# Patient Record
Sex: Male | Born: 1960 | Race: White | Hispanic: No | Marital: Married | Smoking: Never smoker
Health system: Southern US, Community
[De-identification: ages and names within clinical notes are randomized; demographics above are authoritative.]

## PROBLEM LIST (undated history)

## (undated) DIAGNOSIS — E669 Obesity, unspecified: Secondary | ICD-10-CM

## (undated) DIAGNOSIS — I10 Essential (primary) hypertension: Secondary | ICD-10-CM

## (undated) DIAGNOSIS — I5189 Other ill-defined heart diseases: Secondary | ICD-10-CM

## (undated) DIAGNOSIS — Q120 Congenital cataract: Secondary | ICD-10-CM

## (undated) DIAGNOSIS — T7840XA Allergy, unspecified, initial encounter: Secondary | ICD-10-CM

## (undated) DIAGNOSIS — C4491 Basal cell carcinoma of skin, unspecified: Secondary | ICD-10-CM

## (undated) DIAGNOSIS — J309 Allergic rhinitis, unspecified: Secondary | ICD-10-CM

## (undated) DIAGNOSIS — K219 Gastro-esophageal reflux disease without esophagitis: Secondary | ICD-10-CM

## (undated) DIAGNOSIS — C4492 Squamous cell carcinoma of skin, unspecified: Secondary | ICD-10-CM

## (undated) DIAGNOSIS — B20 Human immunodeficiency virus [HIV] disease: Secondary | ICD-10-CM

## (undated) DIAGNOSIS — R42 Dizziness and giddiness: Secondary | ICD-10-CM

## (undated) DIAGNOSIS — I2510 Atherosclerotic heart disease of native coronary artery without angina pectoris: Secondary | ICD-10-CM

## (undated) DIAGNOSIS — E782 Mixed hyperlipidemia: Secondary | ICD-10-CM

## (undated) DIAGNOSIS — F419 Anxiety disorder, unspecified: Secondary | ICD-10-CM

## (undated) DIAGNOSIS — R262 Difficulty in walking, not elsewhere classified: Secondary | ICD-10-CM

## (undated) HISTORY — DX: Essential (primary) hypertension: I10

## (undated) HISTORY — DX: Human immunodeficiency virus (HIV) disease: B20

## (undated) HISTORY — DX: Obesity, unspecified: E66.9

## (undated) HISTORY — DX: Basal cell carcinoma of skin, unspecified: C44.91

## (undated) HISTORY — PX: OTHER SURGICAL HISTORY: SHX169

## (undated) HISTORY — DX: Allergy, unspecified, initial encounter: T78.40XA

---

## 2002-01-07 ENCOUNTER — Inpatient Hospital Stay (HOSPITAL_COMMUNITY): Admission: EM | Admit: 2002-01-07 | Discharge: 2002-01-09 | Payer: Self-pay | Admitting: *Deleted

## 2006-03-16 ENCOUNTER — Ambulatory Visit: Payer: Self-pay | Admitting: Family Medicine

## 2006-09-10 ENCOUNTER — Ambulatory Visit: Payer: Self-pay | Admitting: Family Medicine

## 2007-03-01 ENCOUNTER — Ambulatory Visit: Payer: Self-pay | Admitting: Family Medicine

## 2007-03-06 ENCOUNTER — Ambulatory Visit: Payer: Self-pay | Admitting: Family Medicine

## 2007-03-06 ENCOUNTER — Encounter: Admission: RE | Admit: 2007-03-06 | Discharge: 2007-03-06 | Payer: Self-pay | Admitting: Family Medicine

## 2007-03-06 IMAGING — CR DG SI JOINTS 3+V
3 series · 3 of 3 positions shown · non-contrast
Comparison: None

SACROILIAC JOINTS - 3 VIEW:

CLINICAL DATA: Left low back pain

[t sacroiliac joints (1 of 3)]
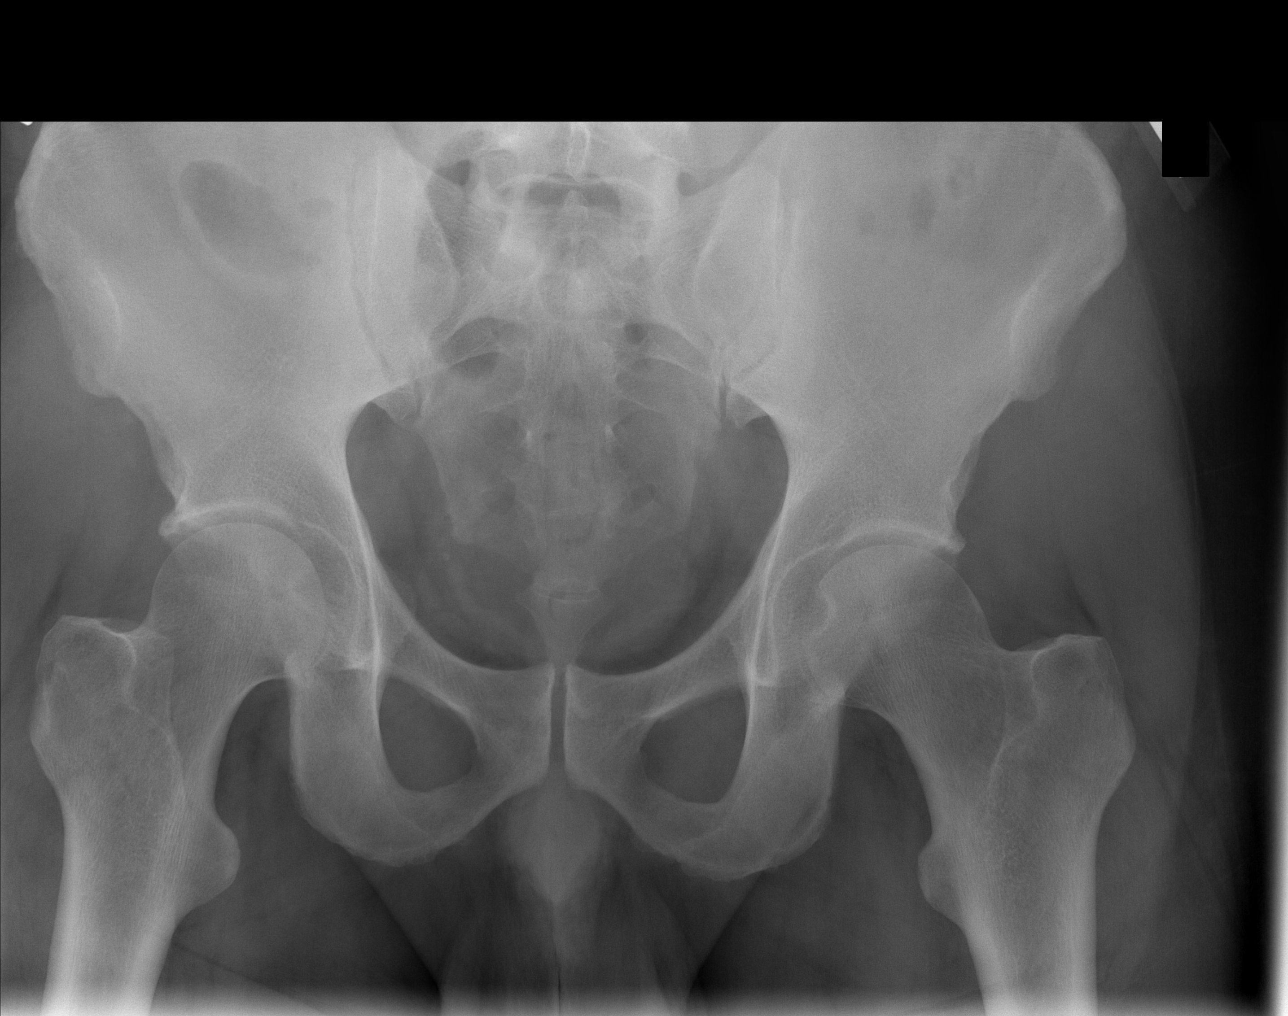

[t sacroiliac joints (2 of 3)]
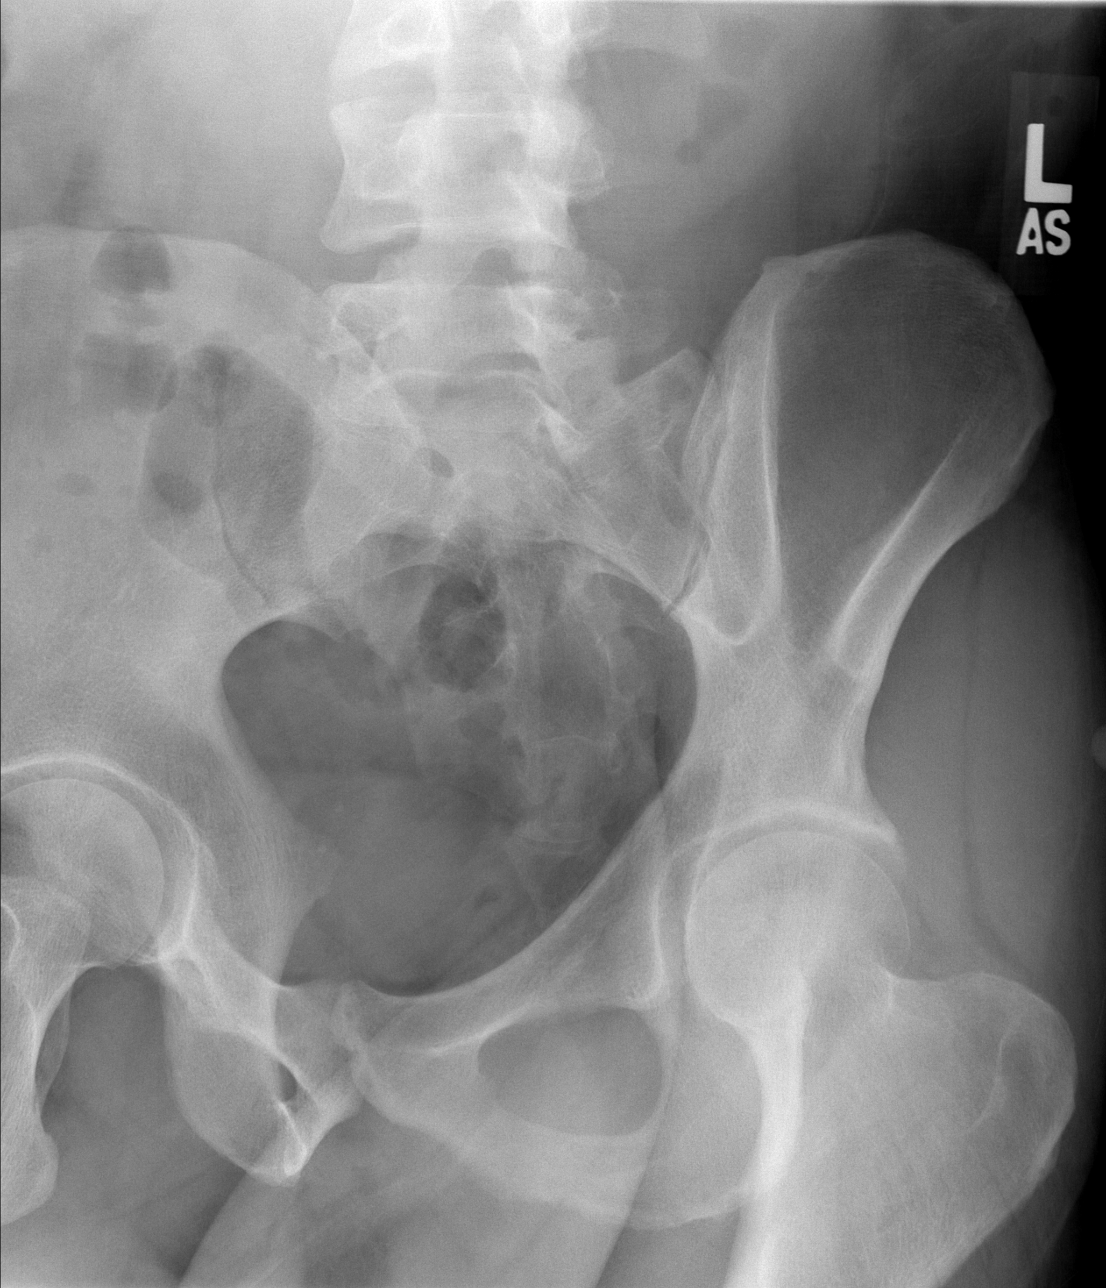

[t sacroiliac joints (3 of 3)]
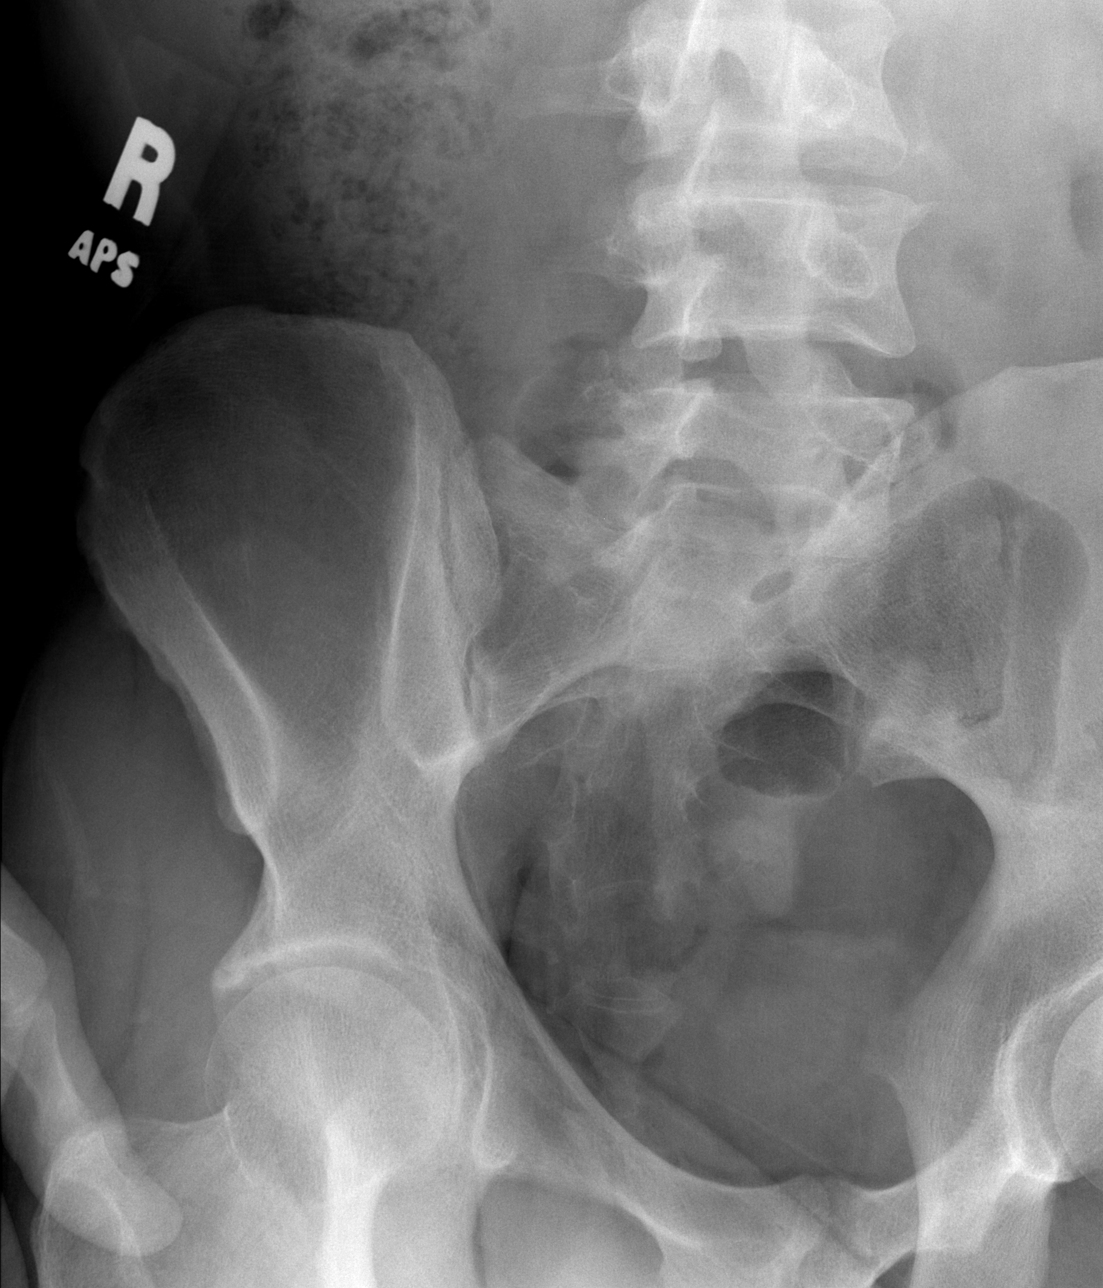

[3 of 3 positions shown; findings below may reference images not displayed]

FINDINGS: The SI joints are normal. No focal abnormality identified in the bony
anatomic pelvis. Joint space in the hips is symmetric.
IMPRESSION: Normal exam

## 2007-03-06 IMAGING — CR DG LUMBAR SPINE COMPLETE 4+V
5 series · 5 of 5 positions shown · non-contrast
Comparison: None.

LUMBAR SPINE - 4  VIEW:

CLINICAL DATA: Left-sided low back pain

[t l-spine a.p.]
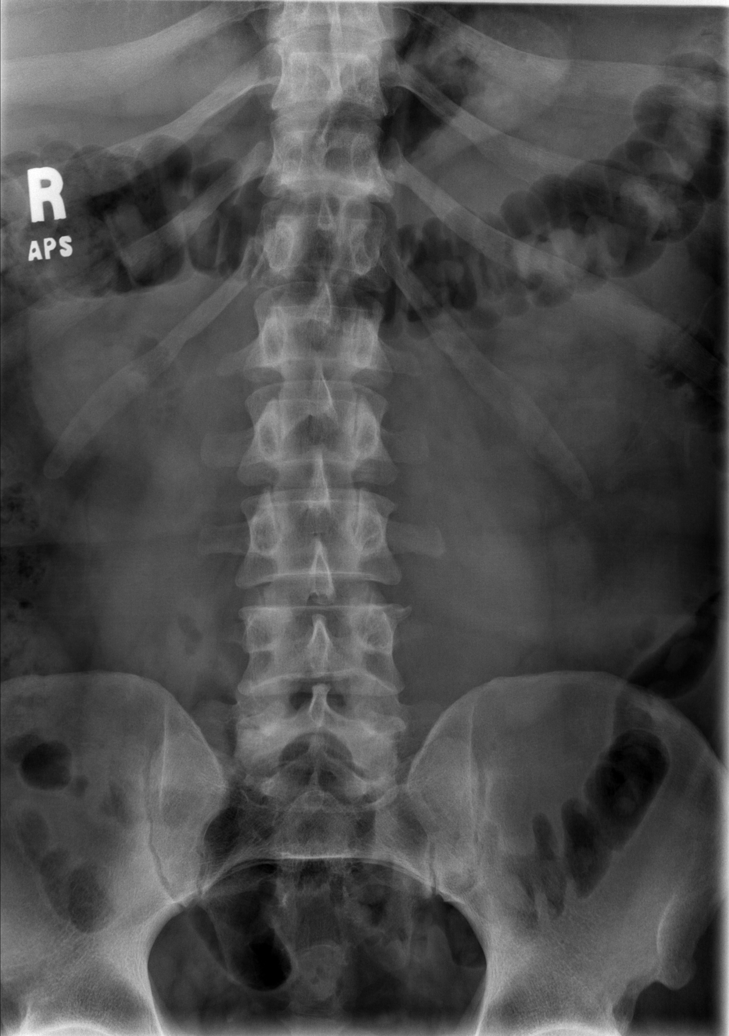

[t l-spine oblique exposure (1 of 2)]
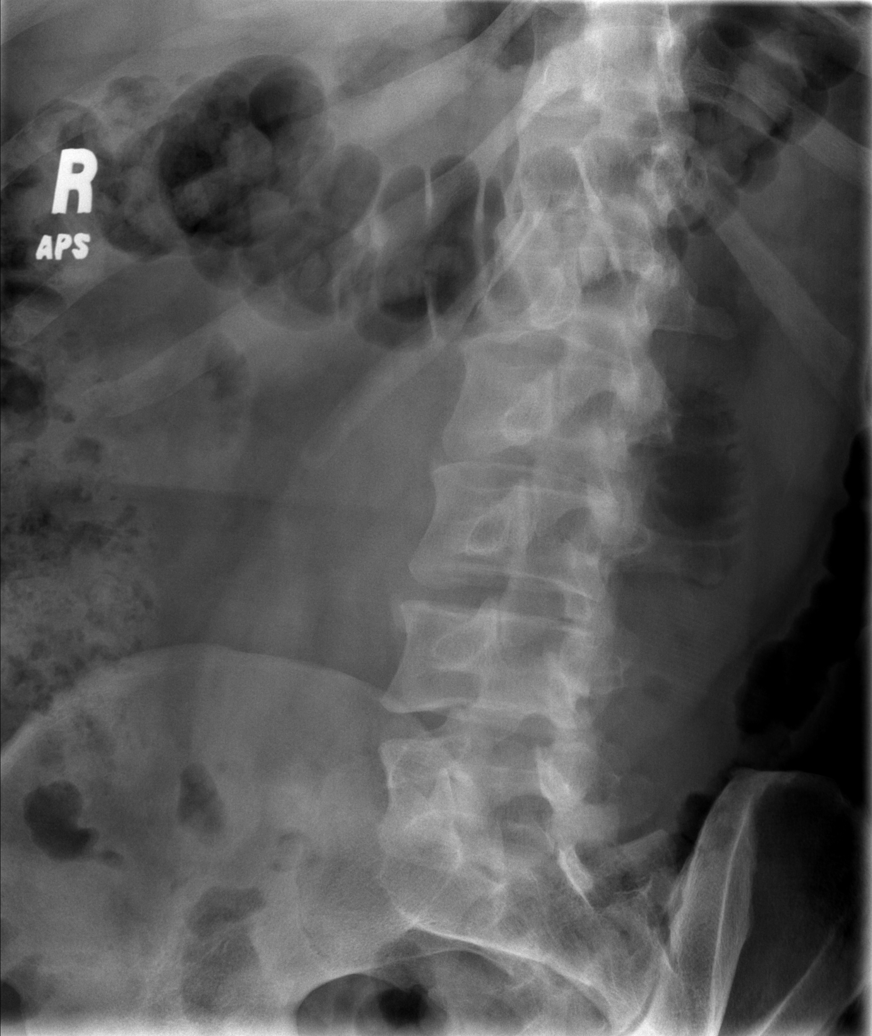

[t l-spine oblique exposure (2 of 2)]
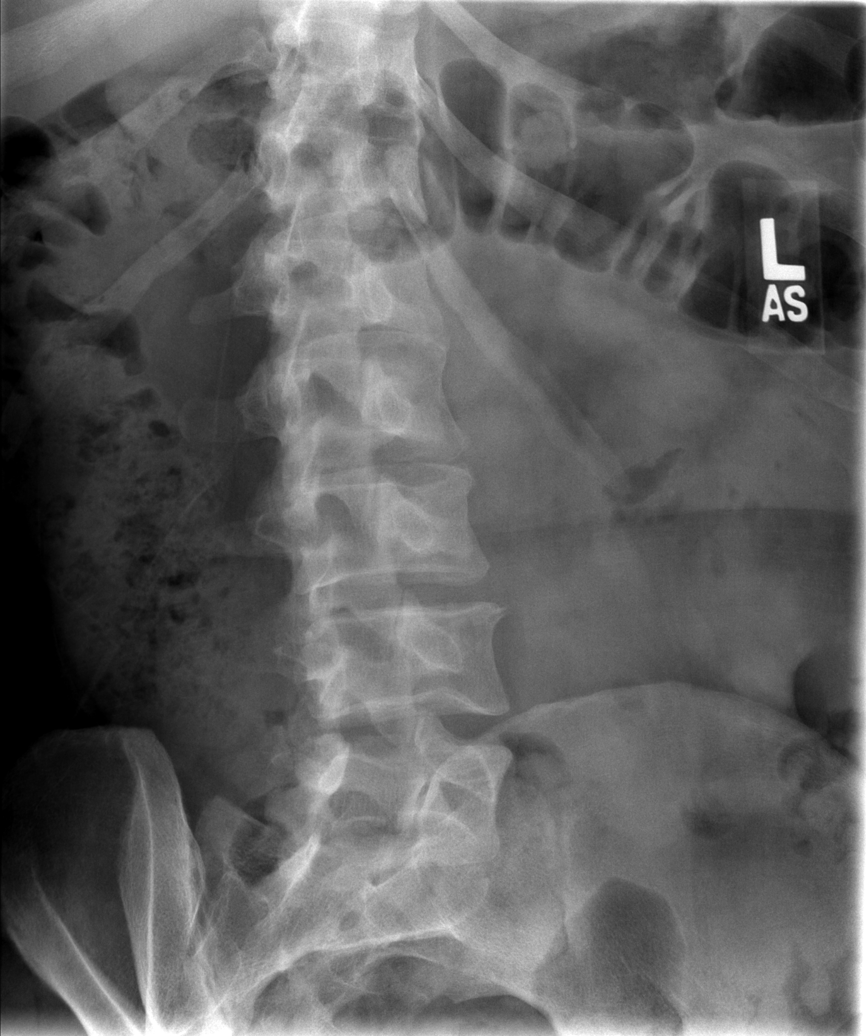

[t l-spine lat]
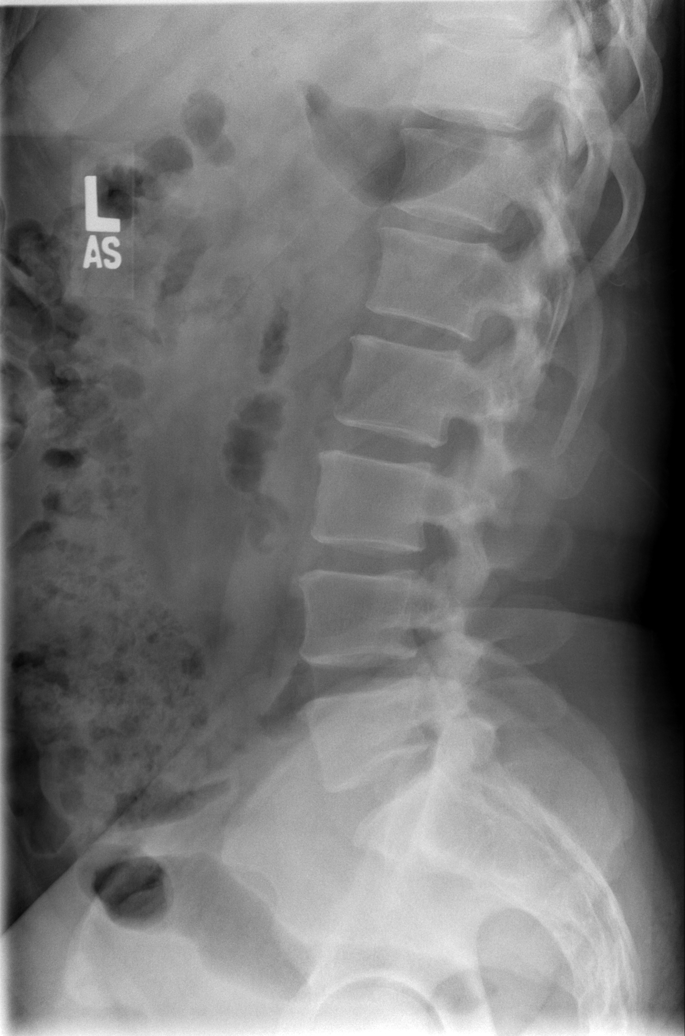

[t l-spine l5-s1 spot]
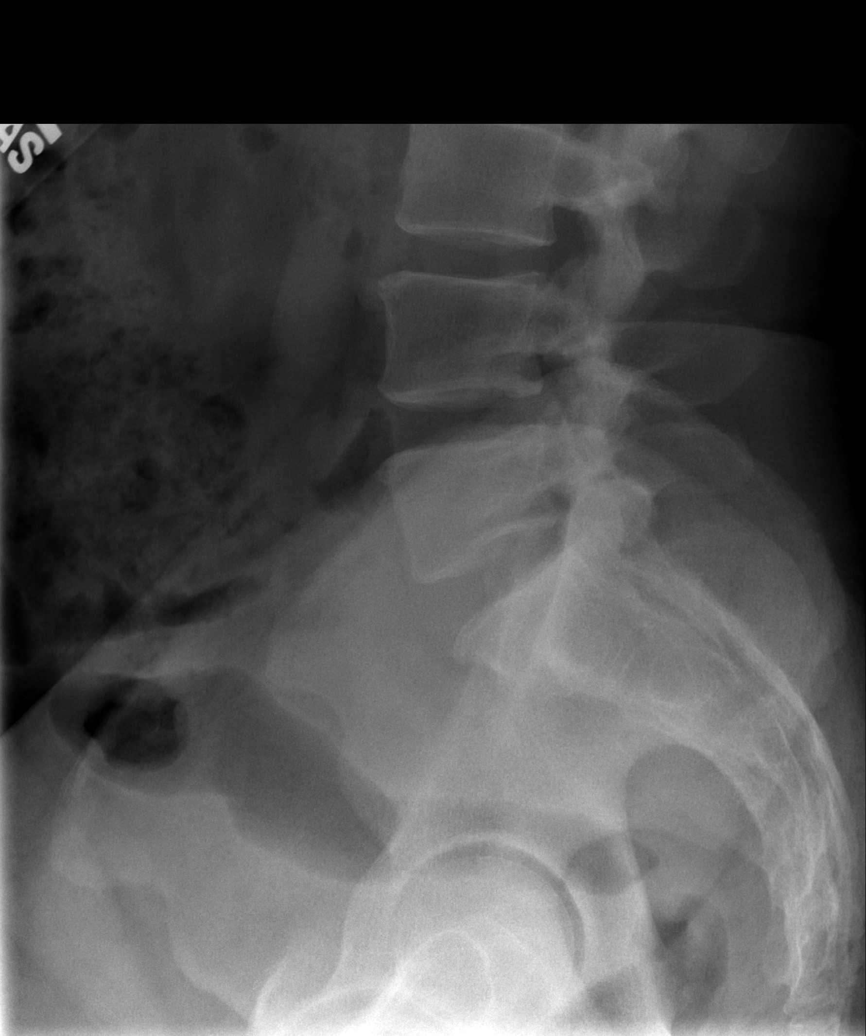

[5 of 5 positions shown; findings below may reference images not displayed]

FINDINGS: There are 5 nonrib-bearing lumbar type vertebral bodies.  Alignment
is normal and intervertebral disc spaces are preserved.  SI joints are
unremarkable.
IMPRESSION: No acute bony abnormality.

## 2007-11-13 ENCOUNTER — Ambulatory Visit: Payer: Self-pay | Admitting: Family Medicine

## 2007-12-12 ENCOUNTER — Ambulatory Visit: Payer: Self-pay | Admitting: Family Medicine

## 2008-02-20 ENCOUNTER — Ambulatory Visit: Payer: Self-pay | Admitting: Family Medicine

## 2008-03-06 ENCOUNTER — Ambulatory Visit: Payer: Self-pay | Admitting: Family Medicine

## 2008-06-09 ENCOUNTER — Ambulatory Visit: Payer: Self-pay | Admitting: Family Medicine

## 2008-12-23 ENCOUNTER — Ambulatory Visit: Payer: Self-pay | Admitting: Family Medicine

## 2009-06-24 ENCOUNTER — Ambulatory Visit: Payer: Self-pay | Admitting: Family Medicine

## 2009-09-28 ENCOUNTER — Ambulatory Visit: Payer: Self-pay | Admitting: Family Medicine

## 2010-06-08 ENCOUNTER — Ambulatory Visit: Payer: Self-pay | Admitting: Family Medicine

## 2010-06-14 ENCOUNTER — Ambulatory Visit: Payer: Self-pay | Admitting: Family Medicine

## 2010-12-23 NOTE — Op Note (Signed)
[facility]  Patient:    Christopher Hart, Christopher Hart Visit Number: [id number] MRN: [id number]          Service Type: MED Location: [id number] Attending Physician:  Talmadge Coventry Dictated by:   Darlin Priestly, M.D. Proc. Date: 01/08/02 Admit Date:  01/07/2002 Discharge Date: 01/09/2002   CC:         Ronnald Nian, M.D.   Operative Report  PROCEDURES: 1. Left heart catheterization. 2. Coronary angiography. 3. Left ventriculogram. 4. Abdominal aortogram.  COMPLICATIONS: None.  INDICATIONS: The patient is a 50 year old male, patient of Dr. Sharlot Gowda with a history of hypertension, HIV positive, who was admitted on January 08, 2002, with substernal chest pain and hypertension. The patient did give a history of several years of exertional angina with pain migrating to his left arm when walking around the block. He had several stress test in the past with marked blood pressure fluctuations, which prompted early cessation of his stress test. He is now brought to the cardiac catheterization laboratory to define his coronary anatomy.  DESCRIPTION OF PROCEDURE: After given informed written consent, the patient was brought to the cardiac catheterization lab where his right and left groins were shaved, prepped, and draped in the usual sterile fashion.  ECG monitoring was established.  Using modified Seldinger technique, a #6 French arterial sheath was inserted in the right femoral artery.  A 6 French diagnostic catheter was then used to perform diagnostic angiography.  This reveals a large left main with no significant disease.  The LAD is a large vessel which courses to the apex and gave rise to two diagonal branch.  The LAD has no significant disease.  The first diagonal is a large vessel which bifurcates in its distal segment and has no significant disease.  The left coronary artery also gave rise to a large ramus intermedius which bifurcates  in its distal segment and has mild 30% ostial narrowing.  The left circumflex is a large vessel which coursed in the AV groove and gave rise to one large obtuse marginal branch. The AV groove circumflex has no significant disease. The first OM is a large vessel which bifurcates in its mid segment and has no significant disease.  The right coronary artery is a large vessel which is dominant and gives rise to both the PDA as well as the posterolateral branch. The RCA is noted to have mild 30% proximal narrowing. The PDA is a large vessel with no significant disease. The PLA is a medium sized vessel with mild 30% proximal narrowing.  LEFT VENTRICULOGRAM: Left ventriculogram reveals a moderately depressed EF of 45-50% with mild global hypokinesis.  ABDOMINAL AORTOGRAM: Abdominal aortogram reveals no evidence of renal artery stenosis.  HEMODYNAMICS: Systemic arterial pressure 134/90, LV systemic pressure 137/12, LVEDP of 17.  CONCLUSIONS: 1. No significant coronary artery disease. 2. Mildly depressed left ventricular systolic function. 3. No evidence of renal artery stenosis. 4. Systemic hypertension. Dictated by:   Darlin Priestly, M.D. Attending Physician:  Talmadge Coventry DD:  01/08/02 TD:  01/10/02 Job: [id number]

## 2010-12-28 ENCOUNTER — Telehealth: Payer: Self-pay | Admitting: Family Medicine

## 2010-12-28 NOTE — Telephone Encounter (Signed)
Faxed refill out

## 2012-03-13 ENCOUNTER — Telehealth: Payer: Self-pay | Admitting: Family Medicine

## 2012-03-13 MED ORDER — EFAVIRENZ-EMTRICITAB-TENOFOVIR 600-200-300 MG PO TABS: 1.0000 | ORAL_TABLET | Freq: Every day | ORAL | Status: DC

## 2012-03-13 NOTE — Telephone Encounter (Signed)
[facility] called in 

## 2012-03-26 ENCOUNTER — Encounter: Payer: Self-pay | Admitting: Internal Medicine

## 2012-04-01 ENCOUNTER — Encounter: Payer: Self-pay | Admitting: Family Medicine

## 2012-04-01 ENCOUNTER — Ambulatory Visit (INDEPENDENT_AMBULATORY_CARE_PROVIDER_SITE_OTHER): Payer: 59 | Admitting: Family Medicine

## 2012-04-01 VITALS — BP 124/86 | HR 82 | Wt 244.0 lb

## 2012-04-01 DIAGNOSIS — B20 Human immunodeficiency virus [HIV] disease: Secondary | ICD-10-CM | POA: Insufficient documentation

## 2012-04-01 DIAGNOSIS — J309 Allergic rhinitis, unspecified: Secondary | ICD-10-CM

## 2012-04-01 DIAGNOSIS — Z21 Asymptomatic human immunodeficiency virus [HIV] infection status: Secondary | ICD-10-CM

## 2012-04-01 DIAGNOSIS — Z79899 Other long term (current) drug therapy: Secondary | ICD-10-CM

## 2012-04-01 DIAGNOSIS — E669 Obesity, unspecified: Secondary | ICD-10-CM

## 2012-04-01 DIAGNOSIS — I10 Essential (primary) hypertension: Secondary | ICD-10-CM

## 2012-04-01 LAB — CBC WITH DIFFERENTIAL/PLATELET
Basophils Absolute: 0.1 10*3/uL (ref 0.0–0.1)
Basophils Relative: 1 % (ref 0–1)
MCHC: 35 g/dL (ref 30.0–36.0)
Monocytes Absolute: 0.7 10*3/uL (ref 0.1–1.0)
Neutro Abs: 3.3 10*3/uL (ref 1.7–7.7)
Neutrophils Relative %: 42 % — ABNORMAL LOW (ref 43–77)
Platelets: 256 10*3/uL (ref 150–400)
RDW: 14.3 % (ref 11.5–15.5)

## 2012-04-01 LAB — COMPREHENSIVE METABOLIC PANEL
Alkaline Phosphatase: 122 U/L — ABNORMAL HIGH (ref 39–117)
BUN: 13 mg/dL (ref 6–23)
Glucose, Bld: 97 mg/dL (ref 70–99)
Total Bilirubin: 0.3 mg/dL (ref 0.3–1.2)

## 2012-04-01 LAB — LIPID PANEL
HDL: 35 mg/dL — ABNORMAL LOW (ref >39)
LDL Cholesterol: 79 mg/dL (ref 0–99)
Total CHOL/HDL Ratio: 4.3 Ratio
Triglycerides: 185 mg/dL — ABNORMAL HIGH (ref <150)
VLDL: 37 mg/dL (ref 0–40)

## 2012-04-01 MED ORDER — HYDROCHLOROTHIAZIDE 25 MG PO TABS: 25.0000 mg | ORAL_TABLET | Freq: Every day | ORAL | Status: DC

## 2012-04-01 MED ORDER — EFAVIRENZ-EMTRICITAB-TENOFOVIR 600-200-300 MG PO TABS: 1.0000 | ORAL_TABLET | Freq: Every day | ORAL | Status: AC

## 2012-04-01 MED ORDER — RAMIPRIL 10 MG PO CAPS: 10.0000 mg | ORAL_CAPSULE | Freq: Every day | ORAL | Status: DC

## 2012-04-01 NOTE — Patient Instructions (Signed)
Give Christopher Hart a call [phone]

## 2012-04-01 NOTE — Progress Notes (Signed)
  Subjective:    Patient ID: Christopher SCIASCIA, male    DOB: 1961-03-05, 51 y.o.   MRN: [id number]  HPI He is here for recheck. He is now working again in a call center. He continues on medications listed in the chart. His social history is unchanged. He is taking care of his parents which is starting to take a toll on him. He does plan to get back into counseling to help deal better psychologically with his parents failing health .He has no fever, chills, cough, congestion, weight change, headache. His allergies seem to be under good control.   Review of Systems Negative except as above    Objective:   Physical Exam alert and in no distress. Funduscopic exam normal Tympanic membranes and canals are normal. Throat is clear. Tonsils are normal. Neck is supple without adenopathy or thyromegaly. Cardiac exam shows a regular sinus rhythm without murmurs or gallops. Lungs are clear to auscultation. Abdominal exam shows no hepatosplenomegaly, masses or tenderness        Assessment & Plan:   1. Hypertension  ramipril (ALTACE) 10 MG capsule, hydrochlorothiazide (HYDRODIURIL) 25 MG tablet  2. HIV positive, asymptomatic  efavirenz-emtricitabine-tenofovir (ATRIPLA) 600-200-300 MG per tablet, HIV 1 RNA quant-no reflex-bld, T-helper cells (CD4) count, Comprehensive metabolic panel, CBC with Differential  3. Allergic rhinitis, mild    4. Obesity (BMI 35.0-39.9 without comorbidity)  Comprehensive metabolic panel, Lipid panel  5. Encounter for long-term (current) use of other medications  HIV 1 RNA quant-no reflex-bld, T-helper cells (CD4) count, Comprehensive metabolic panel, Lipid panel, CBC with Differential

## 2012-04-02 LAB — T-HELPER CELLS (CD4) COUNT (NOT AT ARMC)
CD4 T Helper %: 49 % (ref 32–62)
Total Lymphocyte: 42 % (ref 12–46)
Total lymphocyte count: 3318 /uL — ABNORMAL HIGH (ref 700–3300)

## 2012-04-03 LAB — HIV-1 RNA QUANT-NO REFLEX-BLD
HIV 1 RNA Quant: <20 copies/mL (ref <20)
HIV-1 RNA Quant, Log: <1.3 {Log} (ref <1.30)

## 2012-10-02 ENCOUNTER — Ambulatory Visit: Payer: 59 | Admitting: Family Medicine

## 2012-10-03 ENCOUNTER — Ambulatory Visit: Payer: 59 | Admitting: Family Medicine

## 2012-10-24 ENCOUNTER — Ambulatory Visit: Payer: 59 | Admitting: Family Medicine

## 2013-05-13 ENCOUNTER — Other Ambulatory Visit: Payer: Self-pay | Admitting: Family Medicine

## 2013-10-02 ENCOUNTER — Telehealth: Payer: Self-pay | Admitting: Internal Medicine

## 2013-10-02 NOTE — Telephone Encounter (Signed)
Faxed over medical records to [facility] to [facility] @ [phone]

## 2014-02-03 ENCOUNTER — Other Ambulatory Visit: Payer: Self-pay | Admitting: Family Medicine

## 2014-02-03 ENCOUNTER — Telehealth: Payer: Self-pay

## 2014-02-03 NOTE — Telephone Encounter (Signed)
Dr.lalonde the patient has not been here since 2013 is this okay ?

## 2014-02-03 NOTE — Telephone Encounter (Signed)
He needs an appointment

## 2014-02-03 NOTE — Telephone Encounter (Signed)
Called and left pt message on his cell # that he has not been seen her since 8/13 that he would need an appointment per DR.Lalonde before refills

## 2016-08-07 DIAGNOSIS — D432 Neoplasm of uncertain behavior of brain, unspecified: Secondary | ICD-10-CM

## 2016-08-07 HISTORY — DX: Neoplasm of uncertain behavior of brain, unspecified: D43.2

## 2017-06-12 ENCOUNTER — Other Ambulatory Visit: Payer: Self-pay

## 2017-06-12 ENCOUNTER — Encounter: Payer: Self-pay | Admitting: Emergency Medicine

## 2017-06-12 ENCOUNTER — Emergency Department: Payer: 59

## 2017-06-12 ENCOUNTER — Inpatient Hospital Stay
Admission: EM | Admit: 2017-06-12 | Discharge: 2017-06-13 | DRG: 282 | Disposition: A | Discharge status: Home / Self Care | Payer: 59 | Attending: Internal Medicine | Admitting: Internal Medicine

## 2017-06-12 ENCOUNTER — Inpatient Hospital Stay (HOSPITAL_COMMUNITY)
Admit: 2017-06-12 | Discharge: 2017-06-12 | Disposition: A | Discharge status: Home / Self Care | Payer: 59 | Attending: Physician Assistant | Admitting: Physician Assistant

## 2017-06-12 DIAGNOSIS — Z21 Asymptomatic human immunodeficiency virus [HIV] infection status: Secondary | ICD-10-CM | POA: Diagnosis present

## 2017-06-12 DIAGNOSIS — I10 Essential (primary) hypertension: Secondary | ICD-10-CM | POA: Diagnosis present

## 2017-06-12 DIAGNOSIS — I214 Non-ST elevation (NSTEMI) myocardial infarction: Principal | ICD-10-CM | POA: Diagnosis present

## 2017-06-12 DIAGNOSIS — I340 Nonrheumatic mitral (valve) insufficiency: Secondary | ICD-10-CM | POA: Diagnosis not present

## 2017-06-12 DIAGNOSIS — Z91041 Radiographic dye allergy status: Secondary | ICD-10-CM

## 2017-06-12 DIAGNOSIS — E785 Hyperlipidemia, unspecified: Secondary | ICD-10-CM | POA: Diagnosis present

## 2017-06-12 DIAGNOSIS — I2510 Atherosclerotic heart disease of native coronary artery without angina pectoris: Secondary | ICD-10-CM | POA: Diagnosis present

## 2017-06-12 DIAGNOSIS — Z79899 Other long term (current) drug therapy: Secondary | ICD-10-CM

## 2017-06-12 DIAGNOSIS — Z85828 Personal history of other malignant neoplasm of skin: Secondary | ICD-10-CM

## 2017-06-12 DIAGNOSIS — Z6834 Body mass index (BMI) 34.0-34.9, adult: Secondary | ICD-10-CM | POA: Diagnosis not present

## 2017-06-12 DIAGNOSIS — F419 Anxiety disorder, unspecified: Secondary | ICD-10-CM | POA: Diagnosis present

## 2017-06-12 DIAGNOSIS — Z880 Allergy status to penicillin: Secondary | ICD-10-CM | POA: Diagnosis not present

## 2017-06-12 DIAGNOSIS — Z7982 Long term (current) use of aspirin: Secondary | ICD-10-CM

## 2017-06-12 LAB — TROPONIN I
TROPONIN I: 0.29 ng/mL — AB (ref <0.03)
TROPONIN I: 3.5 ng/mL — AB (ref <0.03)
Troponin I: 1.01 ng/mL (ref <0.03)

## 2017-06-12 LAB — LIPID PANEL
CHOL/HDL RATIO: 4.7 ratio
CHOLESTEROL: 177 mg/dL (ref 0–200)
HDL: 38 mg/dL — ABNORMAL LOW (ref >40)
LDL CALC: 119 mg/dL — AB (ref 0–99)
TRIGLYCERIDES: 101 mg/dL (ref <150)
VLDL: 20 mg/dL (ref 0–40)

## 2017-06-12 LAB — BASIC METABOLIC PANEL
Anion gap: 8 (ref 5–15)
BUN: 13 mg/dL (ref 6–20)
CALCIUM: 8.9 mg/dL (ref 8.9–10.3)
CHLORIDE: 102 mmol/L (ref 101–111)
CO2: 26 mmol/L (ref 22–32)
CREATININE: 1.14 mg/dL (ref 0.61–1.24)
GFR calc non Af Amer: >60 mL/min (ref >60)
GLUCOSE: 118 mg/dL — AB (ref 65–99)
Potassium: 3.9 mmol/L (ref 3.5–5.1)
Sodium: 136 mmol/L (ref 135–145)

## 2017-06-12 LAB — HEMOGLOBIN A1C
Hgb A1c MFr Bld: 5.5 % (ref 4.8–5.6)
Mean Plasma Glucose: 111.15 mg/dL

## 2017-06-12 LAB — CBC
HCT: 49.5 % (ref 40.0–52.0)
Hemoglobin: 16.7 g/dL (ref 13.0–18.0)
MCH: 28.9 pg (ref 26.0–34.0)
MCHC: 33.6 g/dL (ref 32.0–36.0)
MCV: 86 fL (ref 80.0–100.0)
PLATELETS: 250 10*3/uL (ref 150–440)
RBC: 5.76 MIL/uL (ref 4.40–5.90)
RDW: 13.8 % (ref 11.5–14.5)
WBC: 10.7 10*3/uL — ABNORMAL HIGH (ref 3.8–10.6)

## 2017-06-12 LAB — HEPARIN LEVEL (UNFRACTIONATED): Heparin Unfractionated: 0.17 IU/mL — ABNORMAL LOW (ref 0.30–0.70)

## 2017-06-12 LAB — APTT: aPTT: 32 seconds (ref 24–36)

## 2017-06-12 LAB — PROTIME-INR
INR: 1.01
Prothrombin Time: 13.2 seconds (ref 11.4–15.2)

## 2017-06-12 IMAGING — CR DG CHEST 2V
1 series · 2 of 2 positions shown · non-contrast
Comparison: None.

CLINICAL DATA: Chest pain.

EXAM:
CHEST  2 VIEW

[Series 1: w chest pa · 0.14mm/px · 2 of 2 slices shown]
[im 1/2]
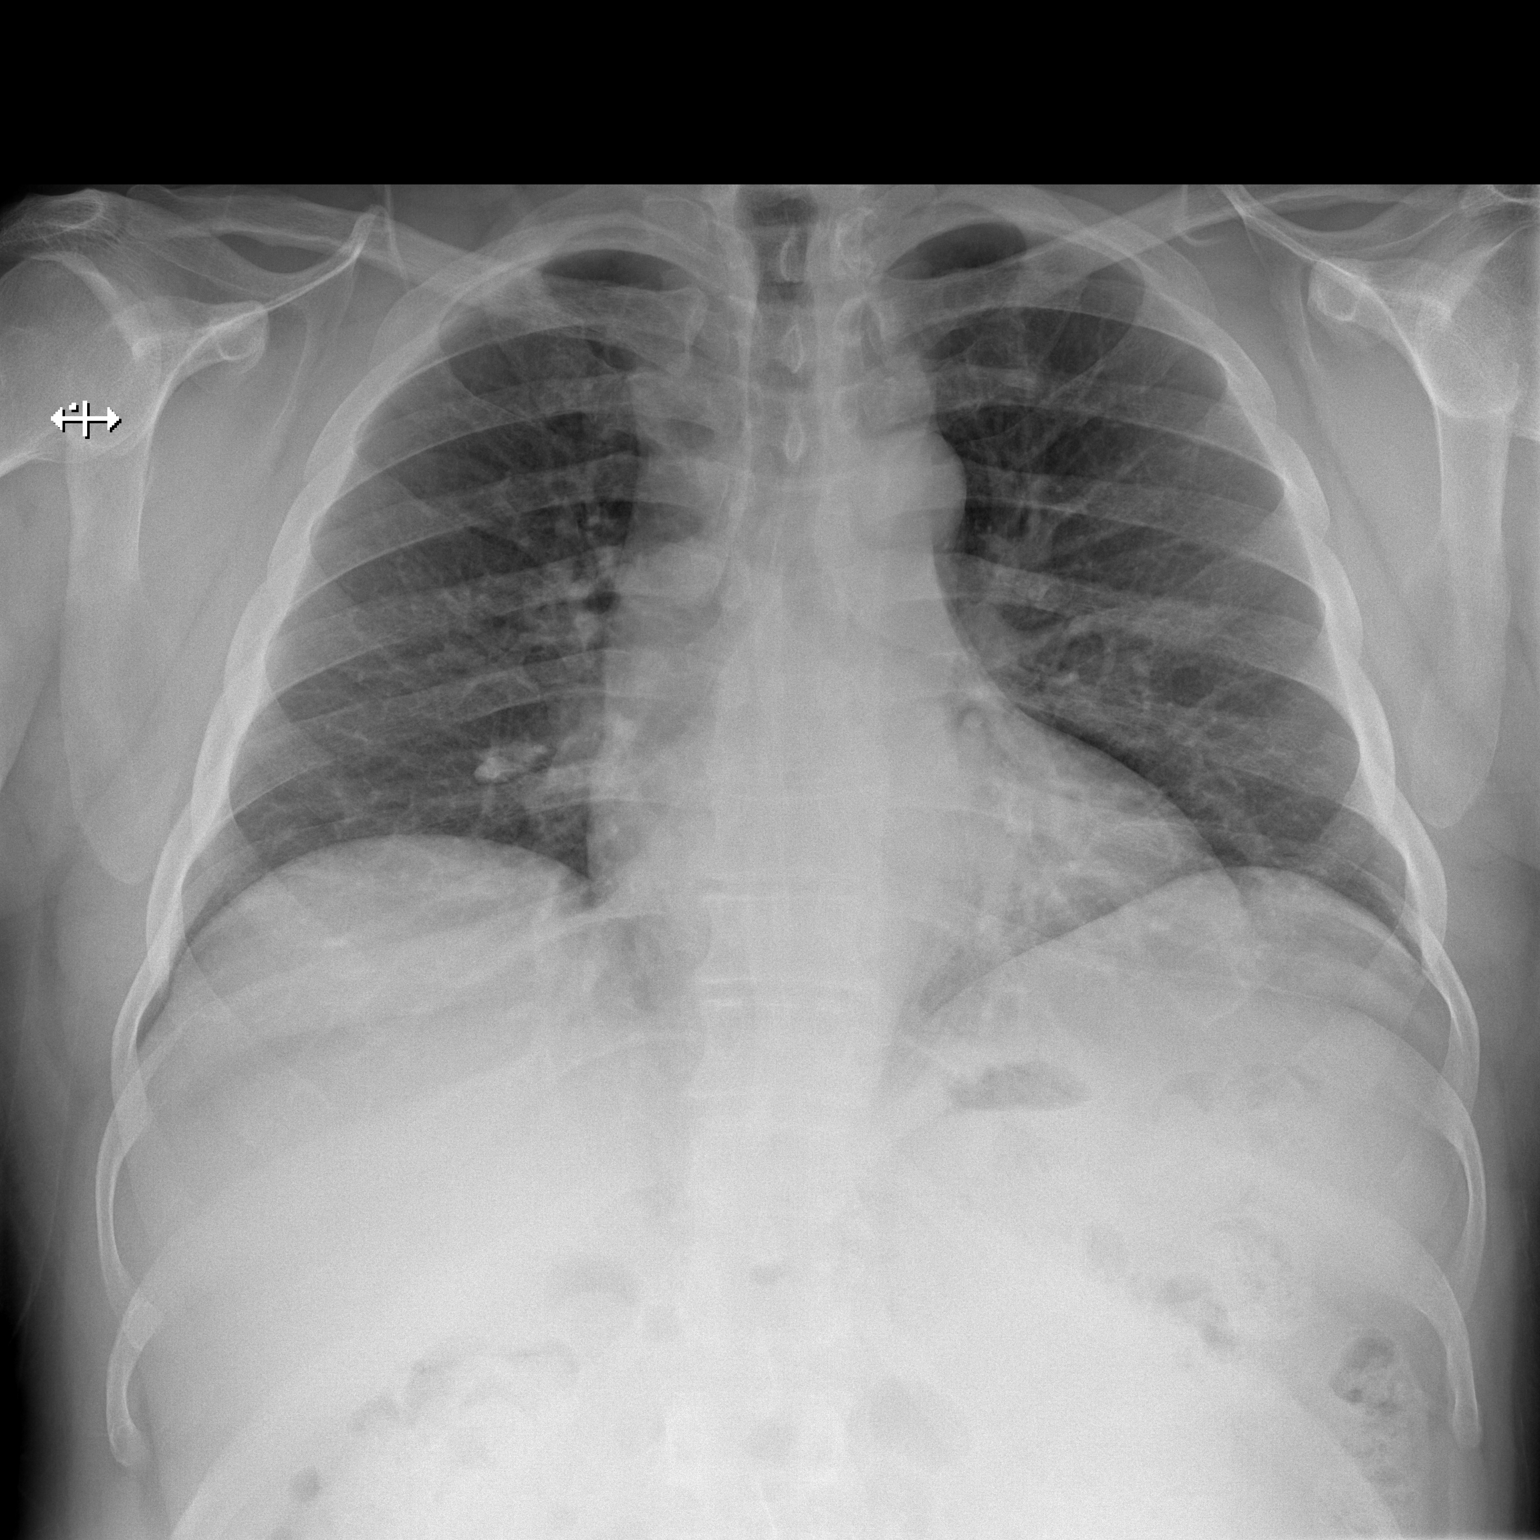
[im 2/2]
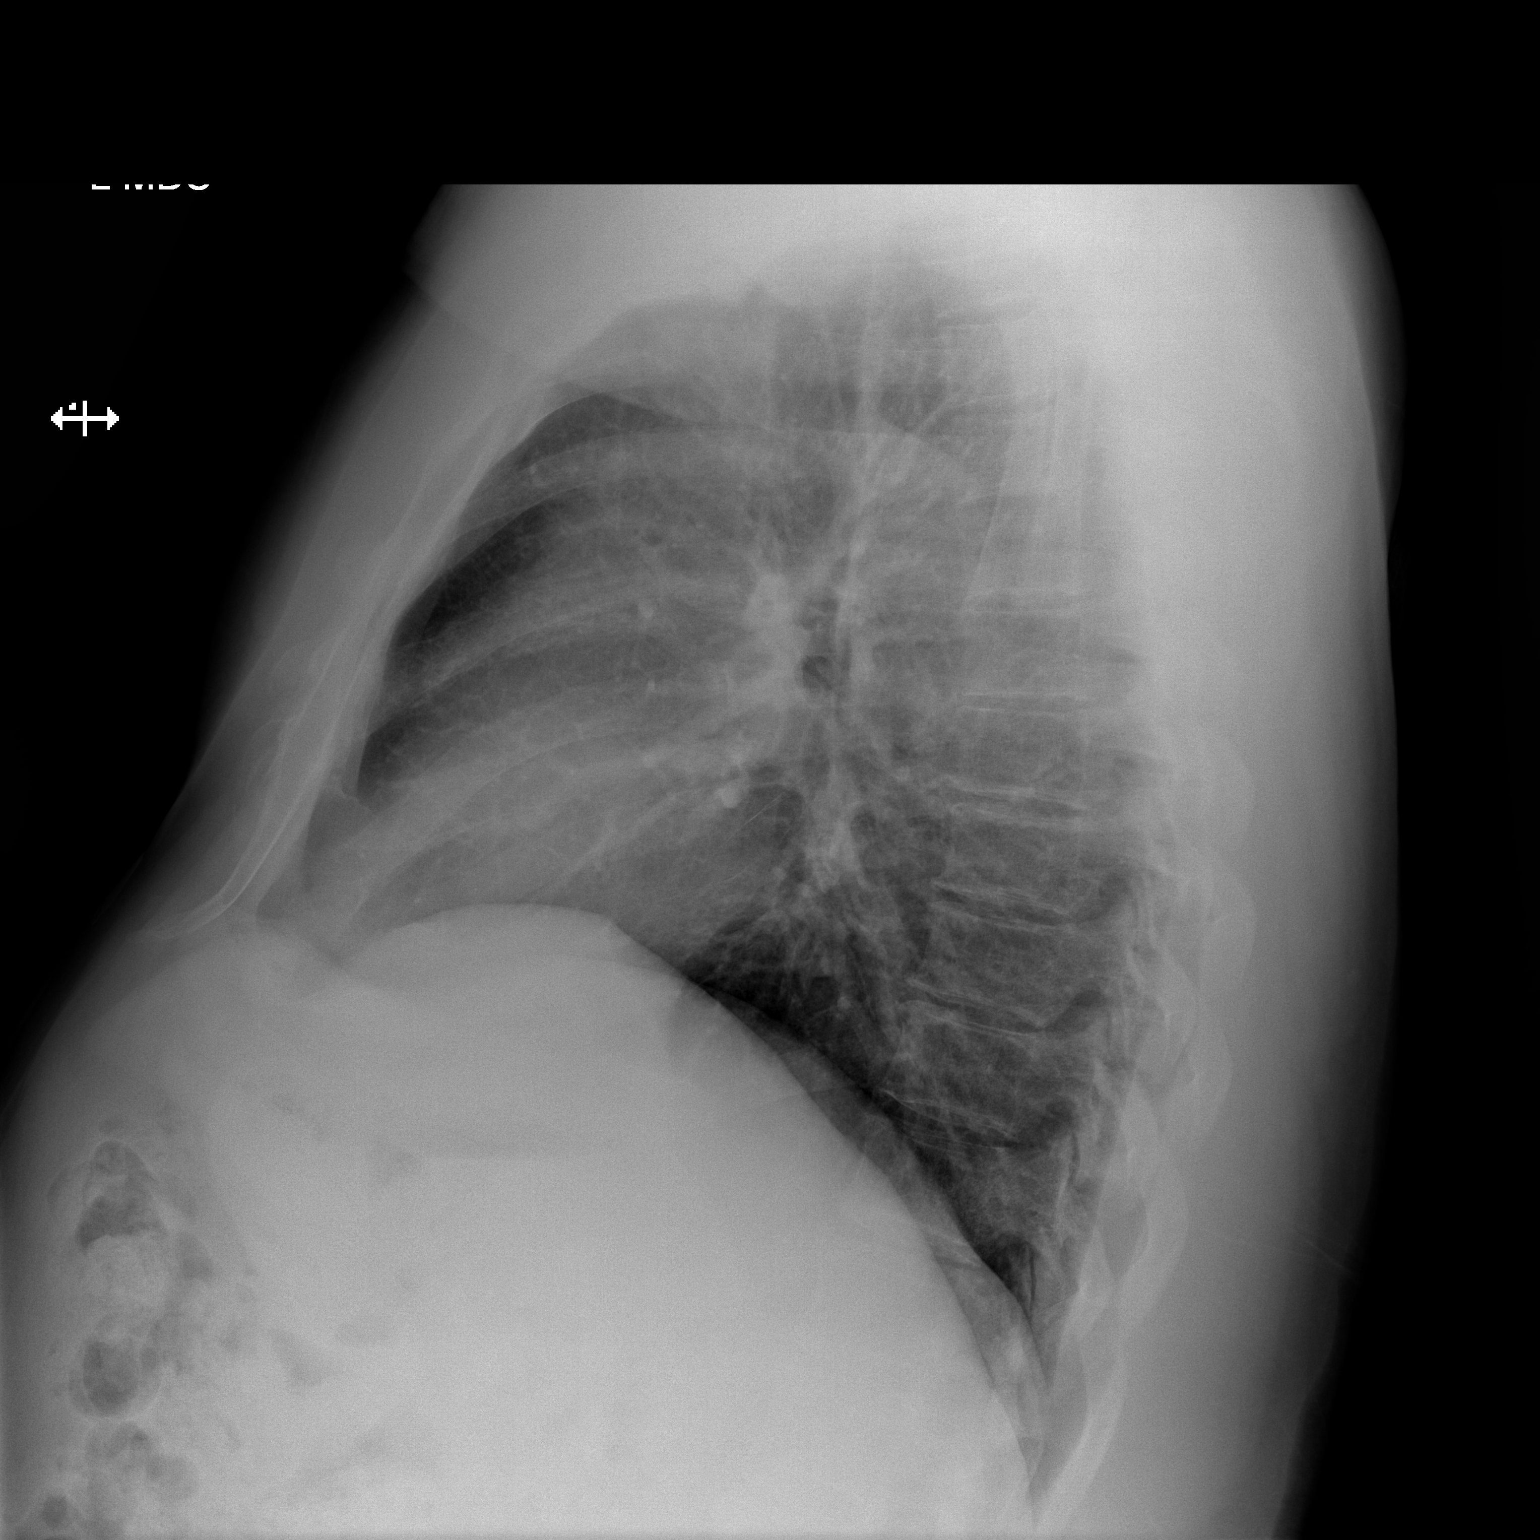

[2 of 2 positions shown; findings below may reference images not displayed]

FINDINGS: The heart size and mediastinal contours are within normal limits.
Both lungs are clear. No pneumothorax or pleural effusion is noted.
The visualized skeletal structures are unremarkable.
IMPRESSION: No active cardiopulmonary disease.

## 2017-06-12 MED ORDER — ASPIRIN 81 MG PO CHEW
81.0000 mg | CHEWABLE_TABLET | Freq: Every day | ORAL | Status: DC
  Administered 2017-06-13: 81 mg via ORAL
  Filled 2017-06-12: qty 1

## 2017-06-12 MED ORDER — DIPHENHYDRAMINE HCL 50 MG/ML IJ SOLN
50.0000 mg | Freq: Once | INTRAMUSCULAR | Status: AC
  Administered 2017-06-13: 50 mg via INTRAVENOUS
  Filled 2017-06-12 (×2): qty 1

## 2017-06-12 MED ORDER — PREDNISONE 50 MG PO TABS
50.0000 mg | ORAL_TABLET | Freq: Four times a day (QID) | ORAL | Status: AC
  Administered 2017-06-12 – 2017-06-13 (×3): 50 mg via ORAL
  Filled 2017-06-12 (×3): qty 1

## 2017-06-12 MED ORDER — MORPHINE SULFATE (PF) 2 MG/ML IV SOLN: 2.0000 mg | INTRAVENOUS | Status: DC | PRN

## 2017-06-12 MED ORDER — DIPHENHYDRAMINE HCL 50 MG/ML IJ SOLN: 50.0000 mg | Freq: Once | INTRAMUSCULAR | Status: DC

## 2017-06-12 MED ORDER — ACETAMINOPHEN 500 MG PO TABS
ORAL_TABLET | ORAL | Status: AC
  Filled 2017-06-12: qty 2

## 2017-06-12 MED ORDER — LISINOPRIL 10 MG PO TABS
10.0000 mg | ORAL_TABLET | Freq: Every day | ORAL | Status: DC
  Administered 2017-06-13: 10 mg via ORAL
  Filled 2017-06-12: qty 1

## 2017-06-12 MED ORDER — HEPARIN BOLUS VIA INFUSION
4000.0000 [IU] | Freq: Once | INTRAVENOUS | Status: AC
  Administered 2017-06-12: 4000 [IU] via INTRAVENOUS
  Filled 2017-06-12: qty 4000

## 2017-06-12 MED ORDER — HYDROCODONE-ACETAMINOPHEN 5-325 MG PO TABS: 1.0000 | ORAL_TABLET | ORAL | Status: DC | PRN

## 2017-06-12 MED ORDER — ASPIRIN 81 MG PO CHEW
81.0000 mg | CHEWABLE_TABLET | ORAL | Status: AC
  Administered 2017-06-13: 81 mg via ORAL
  Filled 2017-06-12: qty 1

## 2017-06-12 MED ORDER — SODIUM CHLORIDE 0.9 % WEIGHT BASED INFUSION
1.0000 mL/kg/h | INTRAVENOUS | Status: DC
  Administered 2017-06-13: 1 mL/kg/h via INTRAVENOUS

## 2017-06-12 MED ORDER — ONDANSETRON HCL 4 MG PO TABS: 4.0000 mg | ORAL_TABLET | Freq: Four times a day (QID) | ORAL | Status: DC | PRN

## 2017-06-12 MED ORDER — HYDRALAZINE HCL 20 MG/ML IJ SOLN: 10.0000 mg | Freq: Four times a day (QID) | INTRAMUSCULAR | Status: DC | PRN

## 2017-06-12 MED ORDER — LABETALOL HCL 5 MG/ML IV SOLN
5.0000 mg | Freq: Once | INTRAVENOUS | Status: DC
  Filled 2017-06-12: qty 4

## 2017-06-12 MED ORDER — SODIUM CHLORIDE 0.9 % IV SOLN: 250.0000 mL | INTRAVENOUS | Status: DC | PRN

## 2017-06-12 MED ORDER — ACETAMINOPHEN 325 MG PO TABS: 650.0000 mg | ORAL_TABLET | Freq: Four times a day (QID) | ORAL | Status: DC | PRN

## 2017-06-12 MED ORDER — BISACODYL 10 MG RE SUPP: 10.0000 mg | Freq: Every day | RECTAL | Status: DC | PRN

## 2017-06-12 MED ORDER — SODIUM CHLORIDE 0.9 % WEIGHT BASED INFUSION: 3.0000 mL/kg/h | INTRAVENOUS | Status: AC

## 2017-06-12 MED ORDER — HEPARIN (PORCINE) IN NACL 100-0.45 UNIT/ML-% IJ SOLN
1400.0000 [IU]/h | INTRAMUSCULAR | Status: DC
  Administered 2017-06-12: 1200 [IU]/h via INTRAVENOUS
  Administered 2017-06-13: 1400 [IU]/h via INTRAVENOUS
  Filled 2017-06-12 (×3): qty 250

## 2017-06-12 MED ORDER — SENNOSIDES-DOCUSATE SODIUM 8.6-50 MG PO TABS: 1.0000 | ORAL_TABLET | Freq: Every evening | ORAL | Status: DC | PRN

## 2017-06-12 MED ORDER — METOPROLOL TARTRATE 25 MG PO TABS
25.0000 mg | ORAL_TABLET | Freq: Two times a day (BID) | ORAL | Status: DC
  Administered 2017-06-12 – 2017-06-13 (×2): 25 mg via ORAL
  Filled 2017-06-12 (×2): qty 1

## 2017-06-12 MED ORDER — SODIUM CHLORIDE 0.9% FLUSH
3.0000 mL | INTRAVENOUS | Status: DC | PRN
Start: 2017-06-12 — End: 2017-06-13

## 2017-06-12 MED ORDER — NITROGLYCERIN 0.4 MG SL SUBL
0.4000 mg | SUBLINGUAL_TABLET | SUBLINGUAL | Status: DC | PRN
  Administered 2017-06-12 (×2): 0.4 mg via SUBLINGUAL
  Filled 2017-06-12: qty 1

## 2017-06-12 MED ORDER — ASPIRIN 81 MG PO CHEW
162.0000 mg | CHEWABLE_TABLET | Freq: Once | ORAL | Status: AC
  Administered 2017-06-12: 162 mg via ORAL
  Filled 2017-06-12: qty 2

## 2017-06-12 MED ORDER — DIPHENHYDRAMINE HCL 25 MG PO CAPS: 50.0000 mg | ORAL_CAPSULE | Freq: Once | ORAL | Status: DC

## 2017-06-12 MED ORDER — ROSUVASTATIN CALCIUM 10 MG PO TABS
10.0000 mg | ORAL_TABLET | Freq: Every day | ORAL | Status: DC
  Administered 2017-06-12 – 2017-06-13 (×2): 10 mg via ORAL
  Filled 2017-06-12: qty 1

## 2017-06-12 MED ORDER — DIPHENHYDRAMINE HCL 25 MG PO CAPS
50.0000 mg | ORAL_CAPSULE | Freq: Once | ORAL | Status: AC
  Filled 2017-06-12: qty 2

## 2017-06-12 MED ORDER — HEPARIN BOLUS VIA INFUSION
3300.0000 [IU] | Freq: Once | INTRAVENOUS | Status: AC
  Administered 2017-06-12: 3300 [IU] via INTRAVENOUS
  Filled 2017-06-12: qty 3300

## 2017-06-12 MED ORDER — SODIUM CHLORIDE 0.9% FLUSH
3.0000 mL | Freq: Two times a day (BID) | INTRAVENOUS | Status: DC
  Administered 2017-06-12 – 2017-06-13 (×2): 3 mL via INTRAVENOUS

## 2017-06-12 MED ORDER — ACETAMINOPHEN 650 MG RE SUPP: 650.0000 mg | Freq: Four times a day (QID) | RECTAL | Status: DC | PRN

## 2017-06-12 MED ORDER — ONDANSETRON HCL 4 MG/2ML IJ SOLN: 4.0000 mg | Freq: Four times a day (QID) | INTRAMUSCULAR | Status: DC | PRN

## 2017-06-12 MED ORDER — ACETAMINOPHEN 500 MG PO TABS
1000.0000 mg | ORAL_TABLET | Freq: Once | ORAL | Status: AC
  Administered 2017-06-12: 1000 mg via ORAL

## 2017-06-12 MED ORDER — NITROGLYCERIN 0.4 MG SL SUBL: 0.4000 mg | SUBLINGUAL_TABLET | SUBLINGUAL | Status: DC | PRN

## 2017-06-12 NOTE — Consult Note (Signed)
Cardiology Consultation:   Patient ID: Christopher Hart; [id number]; [birth year]   Admit date: 06/12/2017 Date of Consult: 06/12/2017  Primary Care Provider: Denita Lung, MD Primary Cardiologist: New to [facility] - consult by End   Patient Profile:   Christopher Hart is a 56 y.o. male with a hx of HIV, HTN, and obesity who is being seen today for the evaluation of NSTEMI at the request of Dr. Benjie Karvonen.  History of Present Illness:   Mr. Goldner reports previously undergoing LHC in 2001 at [facility] for "heart attack" in which no significant obstruction was found. He has not followed up with cardiology since. He does have family history of premature CAD with his mother having her first MI at age 55. He did occasionally smoke tobacco in the 1980s while in college. He denies ever using illegal drugs and denies alcohol use. Back in September, 2018 the patient noted increased chest heaviness and bilateral leg heaviness with ambulation up the stairs. These symptoms lasted ~ 2-3 weeks and self improved without any intervention on his part. He was asymptomatic during the month of October. He went to bed on 11/5 in his usual health. He was woken up at 4 AM with onset of substernal chest pain that radiates to the left shoulder and up the left side of the neck, into the jaw. Pain is a deep, pressure that is rated 8.75/10. There was some associated nausea and SOB. Pain felt similar to his pain back in 2001. While climbing stairs at home prior to coming to the hospital he noted a return of the chest heaviness and bilateral leg heaviness. He took a full dose ASA at home prior to coming to the hospital.  Upon his arrival to [facility] he was noted to have BP range from the 338S to 505 systolic, heart rate from the 50s to 80s bpm, oxygen saturation of 99% on room air, ED reported weight of 245 pounds. Labs showed an initial troponin of 0.29, WBC 10.7, HGB 16.7, PLT 250, SCr 1.14, K+ 3.9, CXR showed no acute cardiopulmonary process.  EKG was not acute as below. In the ED he was given ASA 162 mg, Tylenol, and SL NTG x 2 with improvement in his pain to 1/10.   Past Medical History:  Diagnosis Date  . Allergy   . BCE (basal cell epithelioma)   . HIV infection ([place])   . HTN (hypertension)   . Obesity     History reviewed. No pertinent surgical history.   Home Meds: Prior to Admission medications   Medication Sig Start Date End Date Taking? Authorizing Provider  hydrochlorothiazide (HYDRODIURIL) 25 MG tablet TAKE 1 TABLET BY MOUTH EVERY DAY 05/13/13   Denita Lung, MD  LamoTRIgine (LAMICTAL PO) Take 1 mg by mouth.    [provider]  ramipril (ALTACE) 10 MG capsule TAKE ONE CAPSULE BY MOUTH EVERY DAY 05/13/13   Denita Lung, MD    Inpatient Medications: Scheduled Meds: . lisinopril  10 mg Oral Daily   Continuous Infusions: . heparin 1,200 Units/hr (06/12/17 1558)   PRN Meds: hydrALAZINE, nitroGLYCERIN  Allergies:   Allergies  Allergen Reactions  . Penicillins     Social History:   Social History   Socioeconomic History  . Marital status: Single    Spouse name: Not on file  . Number of children: Not on file  . Years of education: Not on file  . Highest education level: Not on file  Social Needs  .  Financial resource strain: Not on file  . Food insecurity - worry: Not on file  . Food insecurity - inability: Not on file  . Transportation needs - medical: Not on file  . Transportation needs - non-medical: Not on file  Occupational History  . Not on file  Tobacco Use  . Smoking status: Never Smoker  . Smokeless tobacco: Never Used  Substance and Sexual Activity  . Alcohol use: Yes    Comment: rare  . Drug use: No  . Sexual activity: Not Currently  Other Topics Concern  . Not on file  Social History Narrative  . Not on file     Family History:   Family History  Problem Relation Age of Onset  . CAD Mother        a. MI at age 19  . CAD Maternal Grandmother   . CAD Paternal  Grandmother     ROS:  Review of Systems  Constitutional: Positive for malaise/fatigue. Negative for chills, diaphoresis, fever and weight loss.  HENT: Negative for congestion.   Eyes: Negative for discharge and redness.  Respiratory: Negative for cough, hemoptysis, sputum production, shortness of breath and wheezing.   Cardiovascular: Positive for chest pain. Negative for palpitations, orthopnea, claudication, leg swelling and PND.  Gastrointestinal: Positive for nausea. Negative for abdominal pain, blood in stool, heartburn, melena and vomiting.  Genitourinary: Negative for hematuria.  Musculoskeletal: Positive for neck pain. Negative for falls and myalgias.  Skin: Negative for rash.  Neurological: Positive for weakness. Negative for dizziness, tingling, tremors, sensory change, speech change, focal weakness and loss of consciousness.  Endo/Heme/Allergies: Does not bruise/bleed easily.  Psychiatric/Behavioral: Negative for substance abuse. The patient is not nervous/anxious.   All other systems reviewed and are negative.     Physical Exam/Data:   Vitals:   06/12/17 1325 06/12/17 1328 06/12/17 1530 06/12/17 1600  BP:  (!) 176/104 123/74 104/71  Pulse:  60 (!) 53 (!) 53  Resp:  16 15 13   Temp:  97.8 F (36.6 C)    TempSrc:  Oral    SpO2:  99% 99% 98%  Weight: 245 lb (111.1 kg)     Height: 5\' 10"  (1.778 m)      No intake or output data in the 24 hours ending 06/12/17 1618 Filed Weights   06/12/17 1325  Weight: 245 lb (111.1 kg)   Body mass index is 35.15 kg/m.   Physical Exam: General: Well developed, well nourished, in no acute distress. Head: Normocephalic, atraumatic, sclera non-icteric, no xanthomas, nares without discharge. Neck: Negative for carotid bruits. JVD not elevated. Lungs: Clear bilaterally to auscultation without wheezes, rales, or rhonchi. Breathing is unlabored. Heart: RRR with S1 S2. No murmurs, rubs, or gallops appreciated. Abdomen: Soft, non-tender,  non-distended with normoactive bowel sounds. No hepatomegaly. No rebound/guarding. No obvious abdominal masses. Msk:  Strength and tone appear normal for age. Extremities: No clubbing or cyanosis. No edema. Distal pedal pulses are 2+ and equal bilaterally. Neuro: Alert and oriented X 3. No facial asymmetry. No focal deficit. Moves all extremities spontaneously. Psych:  Responds to questions appropriately with a normal affect.   EKG:  The EKG was personally reviewed and demonstrates: NSR, 60 bpm, no acute st/t changes  Telemetry:  Telemetry was personally reviewed and demonstrates: NSR, 70s bpm  Weights: Filed Weights   06/12/17 1325  Weight: 245 lb (111.1 kg)    Relevant CV Studies: Patient reported LHC in 11-13-99 without obstructive disease (no details available for review)  Laboratory  Data:  Chemistry Recent Labs  Lab 06/12/17 1325  NA 136  K 3.9  CL 102  CO2 26  GLUCOSE 118*  BUN 13  CREATININE 1.14  CALCIUM 8.9  GFRNONAA >60  GFRAA >60  ANIONGAP 8    No results for input(s): PROT, ALBUMIN, AST, ALT, ALKPHOS, BILITOT in the last 168 hours. Hematology Recent Labs  Lab 06/12/17 1325  WBC 10.7*  RBC 5.76  HGB 16.7  HCT 49.5  MCV 86.0  MCH 28.9  MCHC 33.6  RDW 13.8  PLT 250   Cardiac Enzymes Recent Labs  Lab 06/12/17 1325  TROPONINI 0.29*   No results for input(s): TROPIPOC in the last 168 hours.  BNPNo results for input(s): BNP, PROBNP in the last 168 hours.  DDimer No results for input(s): DDIMER in the last 168 hours.  Radiology/Studies:  Dg Chest 2 View  Result Date: 06/12/2017 IMPRESSION: No active cardiopulmonary disease. Electronically Signed   By: Marijo Conception, M.D.   On: 06/12/2017 13:49    Assessment and Plan:   1. NSTEMI: -Currently, chest pain much improved from 8.75/10 to 1/10 -Initial troponin 0.29, continue to cycle until peak -Agree with heparin gtt -Echo -LHC on 06/13/17 with Dr. Rockey Situ -Patient reports a contrast  allergy -Contrast allergy pre-medication protocol has been ordered -Check lipid and A1c -ASA -Crestor -SL NTG prn -Morphine prn -Oxygen prn -Bradycardia in the low to mid 50s bpm currently precludes addition of beta blocker at this time, start when able -Cardiac rehab -Risks and benefits of cardiac catheterization have been discussed with the patient including risks of bleeding, bruising, infection, kidney damage, stroke, heart attack, and death. The patient understands these risks and is willing to proceed with the procedure. All questions have been answered and concerns listened to  2. HIV: -Per IM -May need pharmacy review of medications  3. HTN: -Improved -Lopressor as above  4. HLD: -Crestor -FLP pending  5. Leukocytosis: -New evidence of obvious infection -Likely inflammatory related to #1   For questions or updates, please contact [facility] Please consult [email or web address] for contact info under Cardiology/STEMI.   Signed, Christell Faith, PA-C [place] Pager: [phone] 06/12/2017, 4:18 PM

## 2017-06-12 NOTE — ED Triage Notes (Signed)
Patient presents to ED via POV from home with c/o sudden onset of chest pain that woke him from his sleep at 0400 this morning. Patient denies history of MI. Patient states, "it feels like there is a hole in my chest". When asked if the pain was sharp, dull, pressure or burning patient states "yeah all of those". Even and non labored respirations noted.

## 2017-06-12 NOTE — H&P (Signed)
[place] [facility] [place] NAME: Christopher Hart    MR#:  [id number]  DATE OF BIRTH:  [birth year]  DATE OF ADMISSION:  06/12/2017  PRIMARY CARE PHYSICIAN: Denita Lung, MD   REQUESTING/REFERRING PHYSICIAN:  Dr siadecci  CHIEF COMPLAINT:   Chest pain HISTORY OF PRESENT ILLNESS:  Christopher Hart  is a 56 y.o. male with a known history of HIV and essential hypertension who presents today with chest pain. Patient reports that approximate 4 AM this morning he woke up with sternal chest pain which has been constant in nature however relieved with nitroglycerin when he arrived to the emergency room. He reports that it associated with shortness of breath and lightheadedness. He is currently having some dull pain. The chest pain does not radiate to his arm. He denies fever or chills. He took aspirin at home prior to arrival to EMS.  Initial troponin is elevated at 0.29. ED physician spoke with Dr. Saunders Revel who is recommending heparin drip and ongoing monitoring.   Of note patient recently moved from [place] to [place].  PAST MEDICAL HISTORY:   Past Medical History:  Diagnosis Date  . Allergy   . BCE (basal cell epithelioma)   . HIV infection ([place])   . HTN (hypertension)   . Obesity     PAST SURGICAL HISTORY:  On  SOCIAL HISTORY:   Social History   Tobacco Use  . Smoking status: Never Smoker  . Smokeless tobacco: Never Used  Substance Use Topics  . Alcohol use: Yes    Comment: rare    FAMILY HISTORY:  Positive for CAD and hypertension  DRUG ALLERGIES:   Allergies  Allergen Reactions  . Penicillins     REVIEW OF SYSTEMS:   Review of Systems  Constitutional: Negative.  Negative for chills, fever and malaise/fatigue.  HENT: Negative.  Negative for ear discharge, ear pain, hearing loss, nosebleeds and sore throat.   Eyes: Negative.  Negative for blurred vision and pain.  Respiratory: Negative.  Negative for cough, hemoptysis, shortness  of breath and wheezing.   Cardiovascular: Negative.  Negative for chest pain, palpitations and leg swelling.  Gastrointestinal: Negative.  Negative for abdominal pain, blood in stool, diarrhea, nausea and vomiting.  Genitourinary: Negative.  Negative for dysuria.  Musculoskeletal: Negative.  Negative for back pain.  Skin: Negative.   Neurological: Negative for dizziness, tremors, speech change, focal weakness, seizures and headaches.  Endo/Heme/Allergies: Negative.  Does not bruise/bleed easily.  Psychiatric/Behavioral: Positive for depression. Negative for hallucinations and suicidal ideas. The patient is nervous/anxious.     MEDICATIONS AT HOME:   Patient does not know complete list  Aspirin 81 mg daily Lisinopril Celexa HIV medications which he does not know Cholesterol medicine  Pharmacy tech is assisting with medication list.                                  VITAL SIGNS:  Blood pressure (!) 176/104, pulse 60, temperature 97.8 F (36.6 C), temperature source Oral, resp. rate 16, height 5\' 10"  (1.778 m), weight 111.1 kg (245 lb), SpO2 99 %.  PHYSICAL EXAMINATION:   Physical Exam  Constitutional: He is oriented to person, place, and time and well-developed, well-nourished, and in no distress. No distress.  HENT:  Head: Normocephalic.  Eyes: No scleral icterus.  Neck: Normal range of motion. Neck supple. No JVD present. No tracheal deviation present.  Cardiovascular: Normal rate,  regular rhythm and normal heart sounds. Exam reveals no gallop and no friction rub.  No murmur heard. Pulmonary/Chest: Effort normal and breath sounds normal. No respiratory distress. He has no wheezes. He has no rales. He exhibits no tenderness.  Abdominal: Soft. Bowel sounds are normal. He exhibits no distension and no mass. There is no tenderness. There is no rebound and no guarding.  Musculoskeletal: Normal range of motion. He exhibits no edema.  Neurological: He is alert and oriented to  person, place, and time.  Skin: Skin is warm. No rash noted. No erythema.  Psychiatric: Affect and judgment normal.      LABORATORY PANEL:   CBC Recent Labs  Lab 06/12/17 1325  WBC 10.7*  HGB 16.7  HCT 49.5  PLT 250   ------------------------------------------------------------------------------------------------------------------  Chemistries  Recent Labs  Lab 06/12/17 1325  NA 136  K 3.9  CL 102  CO2 26  GLUCOSE 118*  BUN 13  CREATININE 1.14  CALCIUM 8.9   ------------------------------------------------------------------------------------------------------------------  Cardiac Enzymes Recent Labs  Lab 06/12/17 1325  TROPONINI 0.29*   ------------------------------------------------------------------------------------------------------------------  RADIOLOGY:  Dg Chest 2 View  Result Date: 06/12/2017 CLINICAL DATA:  Chest pain. EXAM: CHEST  2 VIEW COMPARISON:  None. FINDINGS: The heart size and mediastinal contours are within normal limits. Both lungs are clear. No pneumothorax or pleural effusion is noted. The visualized skeletal structures are unremarkable. IMPRESSION: No active cardiopulmonary disease. Electronically Signed   By: Marijo Conception, M.D.   On: 06/12/2017 13:49    EKG:  Normal sinus rhythm no ST elevation or depression  IMPRESSION AND PLAN:   56 year old male with history of essential hypertension and HIV who presents with chest pain.  1. Non-ST elevation MI: Continue telemetry and cycling of troponins. Follow-up with cardiology consultation Continue heparin drip Start aspirin, metoprolol, statin and nitroglycerin sublingual when necessary Echocardiogram ordered Check lipid panel and A1c  2. HIV: We will need to restart medications once we have medication list.  3. Essential hypertension:Continue ACEI and add Metoprolol  4. Anxiety: Will restart outpatient medications once we have obtained full list. 5. HLD: Check lipid  panel Continue statin  All the records are reviewed and case discussed with ED provider. Management plans discussed with the patient and he is in agreement  CODE STATUS: FULL  TOTAL TIME TAKING CARE OF THIS PATIENT: 42 minutes.    Michail Boyte M.D on 06/12/2017 at 2:53 PM  Between 7am to 6pm - Pager - [phone]  After 6pm go to [email or web address] - password [email or web address] [place] [facility]  Office  [phone]  CC: Primary care physician; Denita Lung, MD

## 2017-06-12 NOTE — ED Notes (Signed)
ED Provider at bedside. 

## 2017-06-12 NOTE — Consult Note (Signed)
ANTICOAGULATION CONSULT NOTE - Initial Consult  Pharmacy Consult for heparin drip Indication: chest pain/ACS  Allergies  Allergen Reactions  . Penicillins     Patient Measurements: Height: 5\' 10"  (177.8 cm) Weight: 245 lb (111.1 kg) IBW/kg (Calculated) : 73 Heparin Dosing Weight: 97.2 kg  Vital Signs: Temp: 97.8 F (36.6 C) (11/06 1328) Temp Source: Oral (11/06 1328) BP: 176/104 (11/06 1328) Pulse Rate: 60 (11/06 1328)  Labs: Recent Labs    06/12/17 1325  HGB 16.7  HCT 49.5  PLT 250  CREATININE 1.14  TROPONINI 0.29*    Estimated Creatinine Clearance: 88.1 mL/min (by C-G formula based on SCr of 1.14 mg/dL).   Medical History: Past Medical History:  Diagnosis Date  . Allergy   . BCE (basal cell epithelioma)   . HIV infection ([place])   . HTN (hypertension)   . Obesity     Medications:  Scheduled:  . heparin  4,000 Units Intravenous Once    Assessment: Patient is a 56 year old male who presents with chest pain, SOB, light headedness. Pt has family hx of CAD. Pt found to have elevated troponin. Pharmacy consulted to dose heparin drip. Baseline labs ordered. No anticoagulation seen on med rec.  Goal of Therapy:  Heparin level 0.3-0.7 units/ml Monitor platelets by anticoagulation protocol: Yes   Plan:  Give 4000 units bolus x 1 Start heparin infusion at 1200 units/hr Check anti-Xa level in 6 hours and daily while on heparin Continue to monitor H&H and platelets  Tattiana Fakhouri D Maleia Weems 06/12/2017,3:04 PM

## 2017-06-12 NOTE — Consult Note (Signed)
ANTICOAGULATION CONSULT NOTE - Initial Consult  Pharmacy Consult for heparin drip Indication: chest pain/ACS  Allergies  Allergen Reactions  . Penicillins     Patient Measurements: Height: 5\' 10"  (177.8 cm) Weight: 242 lb 12.8 oz (110.1 kg) IBW/kg (Calculated) : 73 Heparin Dosing Weight: 97.2 kg  Vital Signs: Temp: 98.1 F (36.7 C) (11/06 2011) Temp Source: Oral (11/06 2011) BP: 120/78 (11/06 2011) Pulse Rate: 67 (11/06 2011)  Labs: Recent Labs    06/12/17 1325 06/12/17 1454 06/12/17 1724 06/12/17 2235  HGB 16.7  --   --   --   HCT 49.5  --   --   --   PLT 250  --   --   --   APTT  --  32  --   --   LABPROT  --  13.2  --   --   INR  --  1.01  --   --   HEPARINUNFRC  --   --   --  0.17*  CREATININE 1.14  --   --   --   TROPONINI 0.29*  --  1.01* 3.50*    Estimated Creatinine Clearance: 87.7 mL/min (by C-G formula based on SCr of 1.14 mg/dL).   Medical History: Past Medical History:  Diagnosis Date  . Allergy   . BCE (basal cell epithelioma)   . HIV infection ([place])   . HTN (hypertension)   . Obesity     Medications:  Scheduled:  . [START ON 06/13/2017] aspirin  81 mg Oral Daily  . [START ON 06/13/2017] aspirin  81 mg Oral Pre-Cath  . [START ON 06/13/2017] diphenhydrAMINE  50 mg Oral Once   Or  . [START ON 06/13/2017] diphenhydrAMINE  50 mg Intravenous Once  . heparin  3,300 Units Intravenous Once  . lisinopril  10 mg Oral Daily  . metoprolol tartrate  25 mg Oral BID  . predniSONE  50 mg Oral Q6H  . rosuvastatin  10 mg Oral Daily  . sodium chloride flush  3 mL Intravenous Q12H    Assessment: Patient is a 56 year old male who presents with chest pain, SOB, light headedness. Pt has family hx of CAD. Pt found to have elevated troponin. Pharmacy consulted to dose heparin drip. Baseline labs ordered. No anticoagulation seen on med rec.  Goal of Therapy:  Heparin level 0.3-0.7 units/ml Monitor platelets by anticoagulation protocol: Yes   Plan:  Give  4000 units bolus x 1 Start heparin infusion at 1200 units/hr Check anti-Xa level in 6 hours and daily while on heparin Continue to monitor H&H and platelets   11/06 @ 2235 HL 0.17 subtherapeutic. Will rebolus w/ heparin 3300 units IV x 1 and increase rate to 1400 units/hr. Will recheck HL/CBC @ 0600.  Tobie Lords, PharmD, BCPS Clinical Pharmacist 06/12/2017

## 2017-06-12 NOTE — ED Provider Notes (Signed)
[facility] Emergency Department Provider Note ____________________________________________   First MD Initiated Contact with Patient 06/12/17 1419     (approximate)  I have reviewed the triage vital signs and the nursing notes.   HISTORY  Chief Complaint Chest Pain    HPI Christopher Hart is a 56 y.o. male history of hypertension and other past medical history as noted below who presents with chest pain, acute onset at 4 AM this morning, constant course, described as feeling like he has a "hole in my chest," and associated with some shortness of breath and lightheadedness.  No prior history of this pain.  It is nonexertional.  Patient states that 10 or 15 years ago he had an episode of chest pain in which he was told he may have had a borderline heart attack, but states he did not get a cardiac cath and has no known coronary disease.  He states he has a strong family history of coronary disease and heart attacks on his mother side.  No unilateral leg swelling or significant leg pain.  No cough or fever.  Patient took two 81 mg aspirins at home.  Past Medical History:  Diagnosis Date  . Allergy   . BCE (basal cell epithelioma)   . HIV infection ([place])   . HTN (hypertension)   . Obesity     Patient Active Problem List   Diagnosis Date Noted  . Hypertension 04/01/2012  . HIV positive, asymptomatic ([place]) 04/01/2012  . Allergic rhinitis, mild 04/01/2012  . Obesity (BMI 35.0-39.9 without comorbidity) 04/01/2012    History reviewed. No pertinent surgical history.  Prior to Admission medications   Medication Sig Start Date End Date Taking? Authorizing Provider  hydrochlorothiazide (HYDRODIURIL) 25 MG tablet TAKE 1 TABLET BY MOUTH EVERY DAY 05/13/13   Denita Lung, MD  LamoTRIgine (LAMICTAL PO) Take 1 mg by mouth.    [provider]  ramipril (ALTACE) 10 MG capsule TAKE ONE CAPSULE BY MOUTH EVERY DAY 05/13/13   Denita Lung, MD     Allergies Penicillins  No family history on file.  Social History Social History   Tobacco Use  . Smoking status: Never Smoker  . Smokeless tobacco: Never Used  Substance Use Topics  . Alcohol use: Yes    Comment: rare  . Drug use: No    Review of Systems  Constitutional: No fever. Eyes: No visual changes. ENT: No sore throat. Cardiovascular: Positive for chest pain. Respiratory: Positive for shortness of breath. Gastrointestinal: Positive for nausea. Genitourinary: Negative for flank pain.  Musculoskeletal: Negative for back pain. Skin: Negative for rash. Neurological: Negative for headache.    ____________________________________________   PHYSICAL EXAM:  VITAL SIGNS: ED Triage Vitals  Enc Vitals Group     BP 06/12/17 1328 (!) 176/104     Pulse Rate 06/12/17 1328 60     Resp 06/12/17 1328 16     Temp 06/12/17 1328 97.8 F (36.6 C)     Temp Source 06/12/17 1328 Oral     SpO2 06/12/17 1328 99 %     Weight 06/12/17 1325 245 lb (111.1 kg)     Height 06/12/17 1325 5\' 10"  (1.778 m)     Head Circumference --      Peak Flow --      Pain Score --      Pain Loc --      Pain Edu? --      Excl. in [place]? --  Constitutional: Alert and oriented. Well appearing and in no acute distress. Eyes: Conjunctivae are normal.  Head: Atraumatic. Nose: No congestion/rhinnorhea. Mouth/Throat: Mucous membranes are moist.   Neck: Normal range of motion.  Cardiovascular: Normal rate, regular rhythm. Grossly normal heart sounds.  Good peripheral circulation. Respiratory: Normal respiratory effort.  No retractions. Lungs CTAB. Gastrointestinal: Soft and nontender. No distention.  Genitourinary: Mild right CVA tenderness. Musculoskeletal: No lower extremity edema.  No calf swelling or tenderness.  Extremities warm and well perfused.  Neurologic:  Normal speech and language. No gross focal neurologic deficits are appreciated.  Skin:  Skin is warm and dry. No rash  noted. Psychiatric: Mood and affect are normal. Speech and behavior are normal.  ____________________________________________   LABS (all labs ordered are listed, but only abnormal results are displayed)  Labs Reviewed  BASIC METABOLIC PANEL - Abnormal; Notable for the following components:      Result Value   Glucose, Bld 118 (*)    All other components within normal limits  CBC - Abnormal; Notable for the following components:   WBC 10.7 (*)    All other components within normal limits  TROPONIN I - Abnormal; Notable for the following components:   Troponin I 0.29 (*)    All other components within normal limits   ____________________________________________  EKG  ED ECG REPORT I, Arta Silence, the attending physician, personally viewed and interpreted this ECG.  Date: 06/12/2017 EKG Time: 1326 Rate: 60 Rhythm: normal sinus rhythm QRS Axis: normal Intervals: normal ST/T Wave abnormalities: normal Narrative Interpretation: No acute findings; no old EKG for comparison ____________________________________________  RADIOLOGY  CXR: Focal infiltrate or other acute findings.  ____________________________________________   PROCEDURES  Procedure(s) performed: No    Critical Care performed: Yes  CRITICAL CARE Performed by: Arta Silence   Total critical care time: 30 minutes  Critical care time was exclusive of separately billable procedures and treating other patients.  Critical care was necessary to treat or prevent imminent or life-threatening deterioration.  Critical care was time spent personally by me on the following activities: development of treatment plan with patient and/or surrogate as well as nursing, discussions with consultants, evaluation of patient's response to treatment, examination of patient, obtaining history from patient or surrogate, ordering and performing treatments and interventions, ordering and review of laboratory studies,  ordering and review of radiographic studies, pulse oximetry and re-evaluation of patient's condition. ____________________________________________   INITIAL IMPRESSION / ASSESSMENT AND PLAN / ED COURSE  Pertinent labs & imaging results that were available during my care of the patient were reviewed by me and considered in my medical decision making (see chart for details).  56 year old male with a past [place] history of hypertension, and some questionable history of prior MI but a strong family history of CAD presents with somewhat typical chest pain since this morning, with associated shortness of breath and lightheadedness.  EKG is normal, however troponin obtained in triage is elevated.  Available past medical records reviewed in Epic and are noncontributory.  On exam, patient is relatively well-appearing, hypertensive but with otherwise normal vital signs, and the exam is otherwise unremarkable.  Given the relatively typical nature of the pain, and the patient's family history as well as the elevated troponin, presentation is consistent with ACS.  I consulted Dr. and from cardiology, who agreed with placing the patient on a heparin drip, cardiac monitoring, and admission to the hospitalist service for ACS workup.   Patient is ready for admission; signed out to the  hospitalist.        ____________________________________________   FINAL CLINICAL IMPRESSION(S) / ED DIAGNOSES  Final diagnoses:  NSTEMI (non-ST elevated myocardial infarction) ([place])      NEW MEDICATIONS STARTED DURING THIS VISIT:  This SmartLink is deprecated. Use AVSMEDLIST instead to display the medication list for a patient.   Note:  This document was prepared using Dragon voice recognition software and may include unintentional dictation errors.    Arta Silence, MD 06/12/17 1455

## 2017-06-13 ENCOUNTER — Encounter: Payer: Self-pay | Admitting: Emergency Medicine

## 2017-06-13 ENCOUNTER — Encounter
Admission: EM | Disposition: A | Discharge status: Home / Self Care | Payer: Self-pay | Source: Home / Self Care | Attending: Internal Medicine

## 2017-06-13 DIAGNOSIS — I2510 Atherosclerotic heart disease of native coronary artery without angina pectoris: Secondary | ICD-10-CM

## 2017-06-13 HISTORY — PX: LEFT HEART CATH AND CORONARY ANGIOGRAPHY: CATH118249

## 2017-06-13 LAB — CBC
HCT: 50.6 % (ref 40.0–52.0)
Hemoglobin: 17 g/dL (ref 13.0–18.0)
MCH: 29.1 pg (ref 26.0–34.0)
MCHC: 33.5 g/dL (ref 32.0–36.0)
MCV: 86.8 fL (ref 80.0–100.0)
PLATELETS: 251 10*3/uL (ref 150–440)
RBC: 5.83 MIL/uL (ref 4.40–5.90)
RDW: 14 % (ref 11.5–14.5)
WBC: 10.2 10*3/uL (ref 3.8–10.6)

## 2017-06-13 LAB — ECHOCARDIOGRAM COMPLETE
HEIGHTINCHES: 70 in
Weight: 3884.8 oz

## 2017-06-13 LAB — BASIC METABOLIC PANEL
Anion gap: 8 (ref 5–15)
BUN: 13 mg/dL (ref 6–20)
CHLORIDE: 104 mmol/L (ref 101–111)
CO2: 24 mmol/L (ref 22–32)
CREATININE: 1.18 mg/dL (ref 0.61–1.24)
Calcium: 9.1 mg/dL (ref 8.9–10.3)
GFR calc Af Amer: >60 mL/min (ref >60)
GFR calc non Af Amer: >60 mL/min (ref >60)
Glucose, Bld: 143 mg/dL — ABNORMAL HIGH (ref 65–99)
Potassium: 4.2 mmol/L (ref 3.5–5.1)
SODIUM: 136 mmol/L (ref 135–145)

## 2017-06-13 LAB — HEPARIN LEVEL (UNFRACTIONATED): Heparin Unfractionated: 0.5 IU/mL (ref 0.30–0.70)

## 2017-06-13 LAB — TROPONIN I: TROPONIN I: 3.3 ng/mL — AB (ref <0.03)

## 2017-06-13 SURGERY — LEFT HEART CATH AND CORONARY ANGIOGRAPHY
Anesthesia: Moderate Sedation

## 2017-06-13 MED ORDER — IOPAMIDOL (ISOVUE-300) INJECTION 61%
INTRAVENOUS | Status: DC | PRN
  Administered 2017-06-13: 120 mL via INTRA_ARTERIAL

## 2017-06-13 MED ORDER — FENTANYL CITRATE (PF) 100 MCG/2ML IJ SOLN
INTRAMUSCULAR | Status: AC
  Filled 2017-06-13: qty 2

## 2017-06-13 MED ORDER — HEPARIN (PORCINE) IN NACL 2-0.9 UNIT/ML-% IJ SOLN
INTRAMUSCULAR | Status: AC
  Filled 2017-06-13: qty 1000

## 2017-06-13 MED ORDER — CLOPIDOGREL BISULFATE 75 MG PO TABS: 75.0000 mg | ORAL_TABLET | Freq: Every day | ORAL | 11 refills | Status: AC

## 2017-06-13 MED ORDER — ATORVASTATIN CALCIUM 40 MG PO TABS: 20.0000 mg | ORAL_TABLET | Freq: Every day | ORAL | 0 refills | Status: DC

## 2017-06-13 MED ORDER — FENTANYL CITRATE (PF) 100 MCG/2ML IJ SOLN
INTRAMUSCULAR | Status: DC | PRN
  Administered 2017-06-13 (×2): 25 ug via INTRAVENOUS

## 2017-06-13 MED ORDER — ASPIRIN 81 MG PO CHEW: 81.0000 mg | CHEWABLE_TABLET | Freq: Every day | ORAL | 0 refills | Status: AC

## 2017-06-13 MED ORDER — MIDAZOLAM HCL 2 MG/2ML IJ SOLN
INTRAMUSCULAR | Status: DC | PRN
  Administered 2017-06-13 (×2): 1 mg via INTRAVENOUS

## 2017-06-13 MED ORDER — LIDOCAINE HCL (PF) 1 % IJ SOLN
INTRAMUSCULAR | Status: AC
  Filled 2017-06-13: qty 30

## 2017-06-13 MED ORDER — MIDAZOLAM HCL 2 MG/2ML IJ SOLN
INTRAMUSCULAR | Status: AC
  Filled 2017-06-13: qty 2

## 2017-06-13 SURGICAL SUPPLY — 10 items
CATH INFINITI 5FR ANG PIGTAIL (CATHETERS) ×3 IMPLANT
CATH INFINITI 5FR JL4 (CATHETERS) ×3 IMPLANT
CATH INFINITI JR4 5F (CATHETERS) ×3 IMPLANT
DEVICE CLOSURE MYNXGRIP 5F (Vascular Products) ×3 IMPLANT
KIT MANI 3VAL PERCEP (MISCELLANEOUS) ×3 IMPLANT
NEEDLE PERC 18GX7CM (NEEDLE) ×3 IMPLANT
NEEDLE SMART REG 18GX2-3/4 (NEEDLE) ×3 IMPLANT
PACK CARDIAC CATH (CUSTOM PROCEDURE TRAY) ×3 IMPLANT
SHEATH AVANTI 5FR X 11CM (SHEATH) ×3 IMPLANT
WIRE EMERALD 3MM-J .035X150CM (WIRE) ×3 IMPLANT

## 2017-06-13 NOTE — Progress Notes (Signed)
[place] [facility] [place] NAME: Christopher Hart    MR#:  [id number]  DATE OF BIRTH:  1960/12/10  SUBJECTIVE:  CHIEF COMPLAINT:   Chief Complaint  Patient presents with  . Chest Pain  no symptoms REVIEW OF SYSTEMS:  Review of Systems  Constitutional: Negative for chills, fever and weight loss.  HENT: Negative for nosebleeds and sore throat.   Eyes: Negative for blurred vision.  Respiratory: Negative for cough, shortness of breath and wheezing.   Cardiovascular: Positive for chest pain. Negative for orthopnea, leg swelling and PND.  Gastrointestinal: Negative for abdominal pain, constipation, diarrhea, heartburn, nausea and vomiting.  Genitourinary: Negative for dysuria and urgency.  Musculoskeletal: Negative for back pain.  Skin: Negative for rash.  Neurological: Negative for dizziness, speech change, focal weakness and headaches.  Endo/Heme/Allergies: Does not bruise/bleed easily.  Psychiatric/Behavioral: Negative for depression.    DRUG ALLERGIES:   Allergies  Allergen Reactions  . Penicillins    VITALS:  Blood pressure 130/89, pulse 72, temperature 97.6 F (36.4 C), temperature source Oral, resp. rate 16, height 5\' 10"  (1.778 m), weight 106.4 kg (234 lb 8 oz), SpO2 96 %. PHYSICAL EXAMINATION:  Physical Exam  Constitutional: He is oriented to person, place, and time and well-developed, well-nourished, and in no distress.  HENT:  Head: Normocephalic and atraumatic.  Eyes: Conjunctivae and EOM are normal. Pupils are equal, round, and reactive to light.  Neck: Normal range of motion. Neck supple. No tracheal deviation present. No thyromegaly present.  Cardiovascular: Normal rate, regular rhythm and normal heart sounds.  Pulmonary/Chest: Effort normal and breath sounds normal. No respiratory distress. He has no wheezes. He exhibits no tenderness.  Abdominal: Soft. Bowel sounds are normal. He exhibits no distension. There is no  tenderness.  Musculoskeletal: Normal range of motion.  Neurological: He is alert and oriented to person, place, and time. No cranial nerve deficit.  Skin: Skin is warm and dry. No rash noted.  Psychiatric: Mood and affect normal.   LABORATORY PANEL:  Male CBC Recent Labs  Lab 06/13/17 0606  WBC 10.2  HGB 17.0  HCT 50.6  PLT 251   ------------------------------------------------------------------------------------------------------------------ Chemistries  Recent Labs  Lab 06/13/17 0606  NA 136  K 4.2  CL 104  CO2 24  GLUCOSE 143*  BUN 13  CREATININE 1.18  CALCIUM 9.1   RADIOLOGY:  No results found. ASSESSMENT AND PLAN:  56 year old male with history of essential hypertension and HIV admitted with chest pain.  1. Non-ST elevation MI: - troponin peak at 3.5 -Continue heparin gtt -Echo within normal limits -LHC on for later today -Contrast allergy pre-medication protocol has been ordered -Continue ASA, Crestor -Cardiac rehab  2. HIV: restart medications once we have medication list.  3. Essential hypertension:Continue ACEI and Metoprolol  4. Anxiety: Restart home medications  5. HLD: Continue statin       All the records are reviewed and case discussed with Care Management/Social Worker. Management plans discussed with the patient, family and they are in agreement.  CODE STATUS: Full Code  TOTAL TIME TAKING CARE OF THIS PATIENT: 35 minutes.   More than 50% of the time was spent in counseling/coordination of care: YES  POSSIBLE D/C IN 1-2 DAYS, DEPENDING ON CLINICAL CONDITION.   Max Sane M.D on 06/13/2017 at 3:13 PM  Between 7am to 6pm - Pager - [phone]  After 6pm go to [email or web address] - [facility]  Office  [phone]  CC: Primary care physician; Denita Lung, MD  Note: This dictation was prepared with Dragon dictation along with smaller phrase technology. Any transcriptional  errors that result from this process are unintentional.

## 2017-06-13 NOTE — Discharge Instructions (Signed)
Acute Coronary Syndrome °Acute coronary syndrome (ACS) is a serious problem in which there is suddenly not enough blood and oxygen supplied to the heart. ACS may mean that one or more of the blood vessels in your heart (coronary arteries) may be blocked. ACS can result in chest pain or a heart attack (myocardial infarction or MI). °What are the causes? °This condition is caused by atherosclerosis, which is the buildup of fat and cholesterol (plaque) on the inside of the arteries. Over time, the plaque may narrow or block the artery, and this will lessen blood flow to the heart. Plaque can also become weak and break off within a coronary artery to form a clot and cause a sudden blockage. °What increases the risk? °The risk factors of this condition include: °· High cholesterol levels. °· High blood pressure (hypertension). °· Smoking. °· Diabetes. °· Age. °· Family history of chest pain, heart disease, or stroke. °· Lack of exercise. °What are the signs or symptoms? °The most common signs of this condition include: °· Chest pain, which can be: °¨ A crushing or squeezing in the chest. °¨ A tightness, pressure, fullness, or heaviness in the chest. °¨ Present for more than a few minutes, or it can stop and recur. °· Pain in the arms, neck, jaw, or back. °· Unexplained heartburn or indigestion. °· Shortness of breath. °· Nausea. °· Sudden cold sweats. °· Feeling light-headed or dizzy. °Sometimes, this condition has no symptoms. °How is this diagnosed? °ACS may be diagnosed through the following tests: °· Electrocardiogram (ECG). °· Blood tests. °· Coronary angiogram. This is a procedure to look at the coronary arteries to see if there is any blockage. °How is this treated? °Treatment for ACS may include: °· Healthy behavioral changes to reduce or control risk factors. °· Medicine. °· Coronary stenting. A stent helps to keep an artery open. °· Coronary angioplasty. This procedure widens a narrowed or blocked  artery. °· Coronary artery bypass surgery. This will allow your blood to pass the blockage (bypass) to reach your heart. °Follow these instructions at home: °Eating and drinking °· Follow a heart-healthy diet. A dietitian can you help to educate you about healthy food options and changes. °· Use healthy cooking methods such as roasting, grilling, broiling, baking, poaching, steaming, or stir-frying. Talk to a dietitian to learn more about healthy cooking methods. °Medicines °· Take medicines only as directed by your health care provider. °· Do not take the following medicines unless your health care provider approves: °¨ Nonsteroidal anti-inflammatory drugs (NSAIDs), such as ibuprofen, naproxen, or celecoxib. °¨ Vitamin supplements that contain vitamin A, vitamin E, or both. °¨ Hormone replacement therapy that contains estrogen with or without progestin. °· Stop illegal drug use. °Activity °· Follow an exercise program that is approved by your health care provider. °· Plan rest periods when you are fatigued. °Lifestyle °· Do not use any tobacco products, including cigarettes, chewing tobacco, or electronic cigarettes. If you need help quitting, ask your health care provider. °· If you drink alcohol, and your health care provider approves, limit your alcohol intake to no more than 1 drink per day. One drink equals 12 ounces of beer, 5 ounces of wine, or 1½ ounces of hard liquor. °· Learn to manage stress. °· Maintain a healthy weight. Lose weight as approved by your health care provider. °General instructions °· Manage other health conditions, such as hypertension and diabetes, as directed by your health care provider. °· Keep all follow-up visits as directed by your   health care provider. This is important. °· Your health care provider may ask you to monitor your blood pressure. A blood pressure reading consists of a higher number over a lower number, such as 110 over 72, written as 110/72. Ideally, your blood  pressure should be: °¨ Below 140/90 if you have no other medical conditions. °¨ Below 130/80 if you have diabetes or kidney disease. °Get help right away if: °· You have pain in your chest, neck, arm, jaw, stomach, or back that lasts more than a few minutes, is recurring, or is not relieved by taking medicine under your tongue (sublingual nitroglycerin). °· You have profuse sweating without cause. °· You have unexplained: °¨ Heartburn or indigestion. °¨ Shortness of breath or difficulty breathing. °¨ Nausea or vomiting. °¨ Fatigue. °¨ Feelings of nervousness or anxiety. °¨ Weakness. °¨ Diarrhea. °· You have sudden light-headedness or dizziness. °· You faint. °These symptoms may represent a serious problem that is an emergency. Do not wait to see if the symptoms will go away. Get medical help right away. Call your local emergency services (911 in the [place] Do not drive yourself to the clinic or hospital.  °This information is not intended to replace advice given to you by your health care provider. Make sure you discuss any questions you have with your health care provider. °Document Released: 07/24/2005 Document Revised: 01/05/2016 Document Reviewed: 11/25/2013 °Elsevier Interactive Patient Education © 2017 [employer] ° °

## 2017-06-13 NOTE — Consult Note (Signed)
ANTICOAGULATION CONSULT NOTE - Initial Consult  Pharmacy Consult for heparin drip Indication: chest pain/ACS  Allergies  Allergen Reactions  . Penicillins     Patient Measurements: Height: 5\' 10"  (177.8 cm) Weight: 234 lb 8 oz (106.4 kg) IBW/kg (Calculated) : 73 Heparin Dosing Weight: 97.2 kg  Vital Signs: Temp: 97.6 F (36.4 C) (11/07 1156) Temp Source: Oral (11/07 1156) BP: 130/89 (11/07 1156) Pulse Rate: 72 (11/07 1156)  Labs: Recent Labs    06/12/17 1325 06/12/17 1454 06/12/17 1724 06/12/17 2235 06/13/17 0606  HGB 16.7  --   --   --  17.0  HCT 49.5  --   --   --  50.6  PLT 250  --   --   --  251  APTT  --  32  --   --   --   LABPROT  --  13.2  --   --   --   INR  --  1.01  --   --   --   HEPARINUNFRC  --   --   --  0.17* 0.50  CREATININE 1.14  --   --   --  1.18  TROPONINI 0.29*  --  1.01* 3.50* 3.30*    Estimated Creatinine Clearance: 83.4 mL/min (by C-G formula based on SCr of 1.18 mg/dL).   Medical History: Past Medical History:  Diagnosis Date  . Allergy   . BCE (basal cell epithelioma)   . HIV infection ([place])   . HTN (hypertension)   . Obesity     Medications:  Scheduled:  . [MAR Hold] aspirin  81 mg Oral Daily  . [MAR Hold] lisinopril  10 mg Oral Daily  . [MAR Hold] metoprolol tartrate  25 mg Oral BID  . [MAR Hold] rosuvastatin  10 mg Oral Daily  . sodium chloride flush  3 mL Intravenous Q12H    Assessment: Patient is a 56 year old male who presents with chest pain, SOB, light headedness. Pt has family hx of CAD. Pt found to have elevated troponin. Pharmacy consulted to dose heparin drip. Baseline labs ordered. No anticoagulation seen on med rec.  Goal of Therapy:  Heparin level 0.3-0.7 units/ml Monitor platelets by anticoagulation protocol: Yes   Plan:  Give 4000 units bolus x 1 Start heparin infusion at 1200 units/hr Check anti-Xa level in 6 hours and daily while on heparin Continue to monitor H&H and platelets   11/06 @ 2235 HL  0.17 subtherapeutic. Will rebolus w/ heparin 3300 units IV x 1 and increase rate to 1400 units/hr. Will recheck HL/CBC @ 0600.  11/06 @0600  HL 0.50 therapeutic. Will continue current heparin rate of 1400u/hr and recheck HL in 6 hours.   Pernell Dupre, PharmD, BCPS Clinical Pharmacist 06/13/2017 4:17 PM

## 2017-06-13 NOTE — Progress Notes (Signed)
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

## 2017-06-13 NOTE — Progress Notes (Signed)
Patient is discharge home in a stable condition, right groin site good , no hematoma or bleeding  noted at time of discharge, pedal pulse palpable, summary and f/u care given verbalized understanding .

## 2017-06-13 NOTE — Plan of Care (Signed)
  Progressing Education: Knowledge of General Education information will improve 06/13/2017 1806 - Progressing by Darrelyn Hillock, RN Health Behavior/Discharge Planning: Ability to manage health-related needs will improve 06/13/2017 1806 - Progressing by Darrelyn Hillock, RN Clinical Measurements: Ability to maintain clinical measurements within normal limits will improve 06/13/2017 1806 - Progressing by Darrelyn Hillock, RN Will remain free from infection 06/13/2017 1806 - Progressing by Darrelyn Hillock, RN Diagnostic test results will improve 06/13/2017 1806 - Progressing by Darrelyn Hillock, RN Respiratory complications will improve 06/13/2017 1806 - Progressing by Darrelyn Hillock, RN Cardiovascular complication will be avoided 06/13/2017 1806 - Progressing by Darrelyn Hillock, RN Activity: Risk for activity intolerance will decrease 06/13/2017 1806 - Progressing by Darrelyn Hillock, RN Nutrition: Adequate nutrition will be maintained 06/13/2017 1806 - Progressing by Darrelyn Hillock, RN Coping: Level of anxiety will decrease 06/13/2017 1806 - Progressing by Darrelyn Hillock, RN Elimination: Will not experience complications related to bowel motility 06/13/2017 1806 - Progressing by Darrelyn Hillock, RN Will not experience complications related to urinary retention 06/13/2017 1806 - Progressing by Darrelyn Hillock, RN Pain Managment: General experience of comfort will improve 06/13/2017 1806 - Progressing by Darrelyn Hillock, RN Safety: Ability to remain free from injury will improve 06/13/2017 1806 - Progressing by Darrelyn Hillock, RN Skin Integrity: Risk for impaired skin integrity will decrease 06/13/2017 1806 - Progressing by Darrelyn Hillock, RN

## 2017-06-14 ENCOUNTER — Encounter: Payer: Self-pay | Admitting: Cardiovascular Disease

## 2017-06-14 ENCOUNTER — Other Ambulatory Visit: Payer: Self-pay | Admitting: *Deleted

## 2017-06-15 NOTE — Discharge Summary (Signed)
[facility] [place] NAME: Christopher Hart    MR#:  [id number]  DATE OF BIRTH:  1960-08-22  DATE OF ADMISSION:  06/12/2017   ADMITTING PHYSICIAN: Christopher Costa, MD  DATE OF DISCHARGE: 06/13/2017  7:22 PM  PRIMARY CARE PHYSICIAN: Christopher Lung, MD   ADMISSION DIAGNOSIS:  NSTEMI (non-ST elevated myocardial infarction) ([place]) [I21.4] DISCHARGE DIAGNOSIS:  Active Problems:   NSTEMI (non-ST elevated myocardial infarction) ([place])  SECONDARY DIAGNOSIS:   Past Medical History:  Diagnosis Date  . Allergy   . BCE (basal cell epithelioma)   . HIV infection ([place])   . HTN (hypertension)   . Obesity    HOSPITAL COURSE:  56 year old male with history of essential hypertension and HIV admitted with chest pain.  1. Non-ST elevation MI: - troponin peaked at 3.5 - Cardiac Cath showed Occlusion of small distal left circumflex, Disease of branch of ramus, very small vessel No intervention recommended per cardio. 30-40% lesions noted in proximal and mid RCA, proximal mid and distal LAD Aggressive medical management recommended - aspirin and Plavix 1 year - continue lisinopril, metoprolol & statin Outpatient follow-up -Echo within normal limits -Cardiac rehab DISCHARGE CONDITIONS:  stable CONSULTS OBTAINED:  Treatment Team:  Christopher Bush, MD Christopher Merritts, MD DRUG ALLERGIES:   Allergies  Allergen Reactions  . Penicillins    DISCHARGE MEDICATIONS:   Allergies as of 06/13/2017      Reactions   Penicillins       Medication List    STOP taking these medications   hydrochlorothiazide 25 MG tablet Commonly known as:  HYDRODIURIL   ibuprofen 800 MG tablet Commonly known as:  ADVIL,MOTRIN   ramipril 10 MG capsule Commonly known as:  ALTACE     TAKE these medications   ALPRAZolam 0.5 MG tablet Commonly known as:  XANAX Take 0.5 mg 2 (two) times daily by mouth.   aspirin 81 MG chewable tablet Chew 1 tablet (81 mg total)  daily by mouth.   atorvastatin 40 MG tablet Commonly known as:  LIPITOR Take 0.5 tablets (20 mg total) daily by mouth. What changed:  medication strength   ATRIPLA 600-200-300 MG tablet Generic drug:  efavirenz-emtricitabine-tenofovir Take 1 tablet daily by mouth.   buPROPion 150 MG 12 hr tablet Commonly known as:  WELLBUTRIN SR Take 150 mg daily by mouth.   citalopram 40 MG tablet Commonly known as:  CELEXA Take 40 mg daily by mouth.   clopidogrel 75 MG tablet Commonly known as:  PLAVIX Take 1 tablet (75 mg total) daily by mouth.   lisinopril-hydrochlorothiazide 20-12.5 MG tablet Commonly known as:  PRINZIDE,ZESTORETIC Take 2 tablets daily by mouth.   metoprolol succinate 100 MG 24 hr tablet Commonly known as:  TOPROL-XL Take 1 tablet daily by mouth.        DISCHARGE INSTRUCTIONS:   DIET:  Regular diet DISCHARGE CONDITION:  Good ACTIVITY:  Activity as tolerated OXYGEN:  Home Oxygen: No.  Oxygen Delivery: room air DISCHARGE LOCATION:  home   If you experience worsening of your admission symptoms, develop shortness of breath, life threatening emergency, suicidal or homicidal thoughts you must seek medical attention immediately by calling 911 or calling your MD immediately  if symptoms less severe.  You Must read complete instructions/literature along with all the possible adverse reactions/side effects for all the Medicines you take and that have been prescribed to you. Take any new Medicines after you have completely understood and accpet all the possible  adverse reactions/side effects.   Please note  You were cared for by a hospitalist during your hospital stay. If you have any questions about your discharge medications or the care you received while you were in the hospital after you are discharged, you can call the unit and asked to speak with the hospitalist on call if the hospitalist that took care of you is not available. Once you are discharged, your  primary care physician will handle any further medical issues. Please note that NO REFILLS for any discharge medications will be authorized once you are discharged, as it is imperative that you return to your primary care physician (or establish a relationship with a primary care physician if you do not have one) for your aftercare needs so that they can reassess your need for medications and monitor your lab values.    On the day of Discharge:  VITAL SIGNS:  Blood pressure 129/81, pulse 80, temperature 98.2 F (36.8 C), temperature source Oral, resp. rate 18, height 5\' 10"  (1.778 m), weight 106.4 kg (234 lb 8 oz), SpO2 95 %. PHYSICAL EXAMINATION:  GENERAL:  56 y.o.-year-old patient lying in the bed with no acute distress.  EYES: Pupils equal, round, reactive to light and accommodation. No scleral icterus. Extraocular muscles intact.  HEENT: Head atraumatic, normocephalic. Oropharynx and nasopharynx clear.  NECK:  Supple, no jugular venous distention. No thyroid enlargement, no tenderness.  LUNGS: Normal breath sounds bilaterally, no wheezing, rales,rhonchi or crepitation. No use of accessory muscles of respiration.  CARDIOVASCULAR: S1, S2 normal. No murmurs, rubs, or gallops.  ABDOMEN: Soft, non-tender, non-distended. Bowel sounds present. No organomegaly or mass.  EXTREMITIES: No pedal edema, cyanosis, or clubbing.  NEUROLOGIC: Cranial nerves II through XII are intact. Muscle strength 5/5 in all extremities. Sensation intact. Gait not checked.  PSYCHIATRIC: The patient is alert and oriented x 3.  SKIN: No obvious rash, lesion, or ulcer.  DATA REVIEW:   CBC Recent Labs  Lab 06/13/17 0606  WBC 10.2  HGB 17.0  HCT 50.6  PLT 251    Chemistries  Recent Labs  Lab 06/13/17 0606  NA 136  K 4.2  CL 104  CO2 24  GLUCOSE 143*  BUN 13  CREATININE 1.18  CALCIUM 9.1     Follow-up Information    Christopher Lung, MD. Schedule an appointment as soon as possible for a visit in 1  week(s).   Specialty:  Family Medicine Contact information: [place] [address] [phone]        Christopher Merritts, MD. Schedule an appointment as soon as possible for a visit in 2 week(s).   Specialty:  Cardiology Contact information: [place] [address] [phone]           Management plans discussed with the patient, family and they are in agreement.  CODE STATUS: Prior   TOTAL TIME TAKING CARE OF THIS PATIENT: 45 minutes.    Max Sane M.D on 06/15/2017 at 4:25 PM  Between 7am to 6pm - Pager - [phone]  After 6pm go to [email or web address] - Proofreader  [facility]  Office  [phone]  CC: Primary care physician; Christopher Lung, MD   Note: This dictation was prepared with Dragon dictation along with smaller phrase technology. Any transcriptional errors that result from this process are unintentional.

## 2017-06-21 ENCOUNTER — Inpatient Hospital Stay: Payer: Self-pay | Admitting: Family Medicine

## 2017-07-06 ENCOUNTER — Encounter: Payer: Self-pay | Admitting: Family Medicine

## 2017-10-05 DEATH — deceased

## 2018-09-10 ENCOUNTER — Ambulatory Visit: Payer: 59

## 2018-09-10 ENCOUNTER — Other Ambulatory Visit: Payer: Self-pay | Admitting: *Deleted

## 2018-09-10 ENCOUNTER — Other Ambulatory Visit: Payer: 59

## 2018-09-10 ENCOUNTER — Other Ambulatory Visit (HOSPITAL_COMMUNITY)
Admission: RE | Admit: 2018-09-10 | Discharge: 2018-09-10 | Disposition: A | Discharge status: Home / Self Care | Payer: 59 | Source: Ambulatory Visit | Attending: Family | Admitting: Family

## 2018-09-10 DIAGNOSIS — B20 Human immunodeficiency virus [HIV] disease: Secondary | ICD-10-CM | POA: Diagnosis present

## 2018-09-10 DIAGNOSIS — Z113 Encounter for screening for infections with a predominantly sexual mode of transmission: Secondary | ICD-10-CM | POA: Insufficient documentation

## 2018-09-10 DIAGNOSIS — Z79899 Other long term (current) drug therapy: Secondary | ICD-10-CM

## 2018-09-11 LAB — URINALYSIS
BILIRUBIN URINE: NEGATIVE
GLUCOSE, UA: NEGATIVE
HGB URINE DIPSTICK: NEGATIVE
KETONES UR: NEGATIVE
Leukocytes, UA: NEGATIVE
Nitrite: NEGATIVE
Protein, ur: NEGATIVE
Specific Gravity, Urine: 1.022 (ref 1.001–1.03)
pH: <=5 (ref 5.0–8.0)

## 2018-09-11 LAB — URINE CYTOLOGY ANCILLARY ONLY
CHLAMYDIA, DNA PROBE: NEGATIVE
NEISSERIA GONORRHEA: NEGATIVE

## 2018-09-11 LAB — T-HELPER CELL (CD4) - (RCID CLINIC ONLY)
CD4 % Helper T Cell: 49 % (ref 33–55)
CD4 T Cell Abs: 1680 /uL (ref 400–2700)

## 2018-09-16 LAB — COMPLETE METABOLIC PANEL WITH GFR
AG RATIO: 1.5 (calc) (ref 1.0–2.5)
ALKALINE PHOSPHATASE (APISO): 121 U/L (ref 35–144)
ALT: 23 U/L (ref 9–46)
AST: 14 U/L (ref 10–35)
Albumin: 4.3 g/dL (ref 3.6–5.1)
BILIRUBIN TOTAL: 0.3 mg/dL (ref 0.2–1.2)
BUN/Creatinine Ratio: 12 (calc) (ref 6–22)
BUN: 17 mg/dL (ref 7–25)
CHLORIDE: 100 mmol/L (ref 98–110)
CO2: 25 mmol/L (ref 20–32)
Calcium: 9.6 mg/dL (ref 8.6–10.3)
Creat: 1.41 mg/dL — ABNORMAL HIGH (ref 0.70–1.33)
GFR, Est African American: 63 mL/min/{1.73_m2} (ref >=60)
GFR, Est Non African American: 54 mL/min/{1.73_m2} — ABNORMAL LOW (ref >=60)
Globulin: 2.8 g/dL (calc) (ref 1.9–3.7)
Glucose, Bld: 124 mg/dL — ABNORMAL HIGH (ref 65–99)
POTASSIUM: 3.9 mmol/L (ref 3.5–5.3)
SODIUM: 137 mmol/L (ref 135–146)
Total Protein: 7.1 g/dL (ref 6.1–8.1)

## 2018-09-16 LAB — HEPATITIS C ANTIBODY
Hepatitis C Ab: NONREACTIVE
SIGNAL TO CUT-OFF: 0.03 (ref <1.00)

## 2018-09-16 LAB — HIV-1 RNA ULTRAQUANT REFLEX TO GENTYP+
HIV 1 RNA QUANT: 23 {copies}/mL — AB
HIV-1 RNA QUANT, LOG: 1.36 {Log_copies}/mL — AB

## 2018-09-16 LAB — HEPATITIS B CORE ANTIBODY, TOTAL: Hep B Core Total Ab: NONREACTIVE

## 2018-09-16 LAB — LIPID PANEL
CHOLESTEROL: 192 mg/dL (ref <200)
HDL: 37 mg/dL — ABNORMAL LOW (ref >=40)
LDL CHOLESTEROL (CALC): 118 mg/dL — AB
Non-HDL Cholesterol (Calc): 155 mg/dL (calc) — ABNORMAL HIGH (ref <130)
Total CHOL/HDL Ratio: 5.2 (calc) — ABNORMAL HIGH (ref <5.0)
Triglycerides: 260 mg/dL — ABNORMAL HIGH (ref <150)

## 2018-09-16 LAB — CBC WITH DIFFERENTIAL/PLATELET
ABSOLUTE MONOCYTES: 537 {cells}/uL (ref 200–950)
BASOS PCT: 1.1 %
Basophils Absolute: 87 cells/uL (ref 0–200)
EOS PCT: 3.9 %
Eosinophils Absolute: 308 cells/uL (ref 15–500)
HCT: 50.4 % — ABNORMAL HIGH (ref 38.5–50.0)
Hemoglobin: 17.4 g/dL — ABNORMAL HIGH (ref 13.2–17.1)
Lymphs Abs: 3539 cells/uL (ref 850–3900)
MCH: 28.6 pg (ref 27.0–33.0)
MCHC: 34.5 g/dL (ref 32.0–36.0)
MCV: 82.9 fL (ref 80.0–100.0)
MONOS PCT: 6.8 %
MPV: 10.1 fL (ref 7.5–12.5)
NEUTROS PCT: 43.4 %
Neutro Abs: 3429 cells/uL (ref 1500–7800)
Platelets: 295 10*3/uL (ref 140–400)
RBC: 6.08 10*6/uL — AB (ref 4.20–5.80)
RDW: 13.9 % (ref 11.0–15.0)
TOTAL LYMPHOCYTE: 44.8 %
WBC: 7.9 10*3/uL (ref 3.8–10.8)

## 2018-09-16 LAB — HEPATITIS A ANTIBODY, TOTAL: HEPATITIS A AB,TOTAL: NONREACTIVE

## 2018-09-16 LAB — HEPATITIS B SURFACE ANTIBODY,QUALITATIVE: Hep B S Ab: NONREACTIVE

## 2018-09-16 LAB — HIV ANTIBODY (ROUTINE TESTING W REFLEX): HIV: REACTIVE — AB

## 2018-09-16 LAB — QUANTIFERON-TB GOLD PLUS
MITOGEN-NIL: 6.91 [IU]/mL
NIL: 0.03 [IU]/mL
QUANTIFERON-TB GOLD PLUS: NEGATIVE
TB1-NIL: 0.01 [IU]/mL
TB2-NIL: 0 [IU]/mL

## 2018-09-16 LAB — RPR: RPR Ser Ql: NONREACTIVE

## 2018-09-16 LAB — HLA B*5701: HLA-B 5701 W/RFLX HLA-B HIGH: NEGATIVE

## 2018-09-16 LAB — HIV-1/2 AB - DIFFERENTIATION
HIV-1 antibody: POSITIVE — AB
HIV-2 Ab: NEGATIVE

## 2018-09-16 LAB — HEPATITIS B SURFACE ANTIGEN: Hepatitis B Surface Ag: NONREACTIVE

## 2018-09-26 ENCOUNTER — Ambulatory Visit: Payer: 59 | Admitting: Pharmacist

## 2018-09-26 ENCOUNTER — Ambulatory Visit: Payer: 59 | Admitting: Family

## 2018-10-08 ENCOUNTER — Encounter: Payer: Self-pay | Admitting: Family

## 2018-10-21 ENCOUNTER — Ambulatory Visit (INDEPENDENT_AMBULATORY_CARE_PROVIDER_SITE_OTHER): Payer: 59 | Admitting: Pharmacist

## 2018-10-21 ENCOUNTER — Encounter: Payer: Self-pay | Admitting: Family

## 2018-10-21 ENCOUNTER — Telehealth: Payer: Self-pay | Admitting: Pharmacy Technician

## 2018-10-21 ENCOUNTER — Ambulatory Visit (INDEPENDENT_AMBULATORY_CARE_PROVIDER_SITE_OTHER): Payer: 59 | Admitting: Family

## 2018-10-21 ENCOUNTER — Other Ambulatory Visit: Payer: Self-pay

## 2018-10-21 VITALS — BP 153/104 | HR 80 | Temp 97.3°F | Wt 254.0 lb

## 2018-10-21 DIAGNOSIS — B20 Human immunodeficiency virus [HIV] disease: Secondary | ICD-10-CM

## 2018-10-21 DIAGNOSIS — Z0000 Encounter for general adult medical examination without abnormal findings: Secondary | ICD-10-CM

## 2018-10-21 MED ORDER — BICTEGRAVIR-EMTRICITAB-TENOFOV 50-200-25 MG PO TABS: 1.0000 | ORAL_TABLET | Freq: Every day | ORAL | 2 refills | Status: DC

## 2018-10-21 NOTE — Assessment & Plan Note (Signed)
Christopher Hart has well controlled HIV disease with good adherence and tolerance to his ART regimen of Atripla. He has no signs/symptoms of opportunistic infection or progressive HIV disease at present. We discussed medication options for him as there are better choices and given renal function would change him to [facility]. He is in agreement to try Biktarvy. Plan to follow up in 1 month or sooner if needed to recheck lab work. He is considering following up with Dr. Delena Bali.

## 2018-10-21 NOTE — Progress Notes (Signed)
Subjective:    Patient ID: Christopher Hart, male    DOB: [birth year], 58 y.o.   MRN: [id number]  Chief Complaint  Patient presents with  . HIV Positive/AIDS    HPI:  Christopher Hart is a 58 y.o. male who presents today for consultation for HIV disease at the request of his PCP Dr. Delena Bali.   Christopher Hart was initially diagnosed with HIV disease in 2002 when he was notified that a partner had tested positive. Risk factor for acquiring HIV is MSM. He has been well controlled since his initial diagnosis. Previously on Sustiva and other medications that he does not recall and most recently on Atripla which he currently takes with no adverse side effects or missed doses. Dr. Delena Bali had a concern for a recent viral load of 50 which is up from previous viral loads.  Christopher Hart initial clinic blood work showed a viral load of 23 with CD4 count of 1,680. There was no genotype available. He was negative for STI including gonorrhea, chlamydia and syphilis. [id number] and Quantiferon Gold were negative. Kidney function significant for creatinine of 1.41 with liver function and electrolytes being normal.  Christopher Hart continues to take his Atripla and is covered through [employer]. He is feeling well today. Denies fevers, chills, night sweats, headaches, changes in vision, neck pain/stiffness, nausea, diarrhea, vomiting, lesions or rashes.  He most recently completed his dental screening and colonoscopy. He is currently unemployed and actively seeking work. He has stable housing.  On medication for depression and anxiety that is managed by his PCP.    Allergies  Allergen Reactions  . Penicillins     Whelps      Outpatient Medications Prior to Visit  Medication Sig Dispense Refill  . ALPRAZolam (XANAX) 0.5 MG tablet Take 0.5 mg 2 (two) times daily by mouth.  5  . aspirin 81 MG chewable tablet Chew 1 tablet (81 mg total) daily by mouth. 30 tablet 0  . atorvastatin (LIPITOR) 40 MG tablet Take 0.5  tablets (20 mg total) daily by mouth. 30 tablet 0  . buPROPion (WELLBUTRIN SR) 150 MG 12 hr tablet Take 150 mg daily by mouth.  3  . citalopram (CELEXA) 40 MG tablet Take 40 mg daily by mouth.  3  . fluconazole (DIFLUCAN) 200 MG tablet     . lisinopril-hydrochlorothiazide (PRINZIDE,ZESTORETIC) 20-12.5 MG tablet Take 2 tablets daily by mouth.  3  . metoprolol succinate (TOPROL-XL) 100 MG 24 hr tablet Take 1 tablet daily by mouth.  3  . ATRIPLA 600-200-300 MG tablet Take 1 tablet daily by mouth.    Marland Kitchen omeprazole (PRILOSEC) 20 MG capsule      No facility-administered medications prior to visit.      Past Medical History:  Diagnosis Date  . Allergy   . BCE (basal cell epithelioma)   . HIV infection ([place])   . HTN (hypertension)   . Obesity       Past Surgical History:  Procedure Laterality Date  . Cataract surgery Bilateral   . LEFT HEART CATH AND CORONARY ANGIOGRAPHY N/A 06/13/2017   Procedure: LEFT HEART CATH AND CORONARY ANGIOGRAPHY;  Surgeon: Minna Merritts, MD;  Location: [facility];  Service: Cardiovascular;  Laterality: N/A;      Family History  Problem Relation Age of Onset  . CAD Mother        a. MI at age 70  . CAD Maternal Grandmother   . CAD Paternal Grandmother  Social History   Socioeconomic History  . Marital status: Single    Spouse name: Not on file  . Number of children: Not on file  . Years of education: Not on file  . Highest education level: Not on file  Occupational History  . Occupation: Unemployed   Social Needs  . Financial resource strain: Not on file  . Food insecurity:    Worry: Not on file    Inability: Not on file  . Transportation needs:    Medical: Not on file    Non-medical: Not on file  Tobacco Use  . Smoking status: Never Smoker  . Smokeless tobacco: Never Used  Substance and Sexual Activity  . Alcohol use: Yes    Comment: rare  . Drug use: No  . Sexual activity: Not Currently  Lifestyle  . Physical  activity:    Days per week: Not on file    Minutes per session: Not on file  . Stress: Not on file  Relationships  . Social connections:    Talks on phone: Not on file    Gets together: Not on file    Attends religious service: Not on file    Active member of club or organization: Not on file    Attends meetings of clubs or organizations: Not on file    Relationship status: Not on file  . Intimate partner violence:    Fear of current or ex partner: Not on file    Emotionally abused: Not on file    Physically abused: Not on file    Forced sexual activity: Not on file  Other Topics Concern  . Not on file  Social History Narrative  . Not on file      Review of Systems  Constitutional: Negative for appetite change, chills, fatigue, fever and unexpected weight change.  Eyes: Negative for visual disturbance.  Respiratory: Negative for cough, chest tightness, shortness of breath and wheezing.   Cardiovascular: Negative for chest pain and leg swelling.  Gastrointestinal: Negative for abdominal pain, constipation, diarrhea, nausea and vomiting.  Genitourinary: Negative for dysuria, flank pain, frequency, genital sores, hematuria and urgency.  Skin: Negative for rash.  Allergic/Immunologic: Negative for immunocompromised state.  Neurological: Negative for dizziness and headaches.       Objective:    BP (!) 153/104   Pulse 80   Temp (!) 97.3 F (36.3 C) (Oral)   Wt 254 lb (115.2 kg)   BMI 36.45 kg/m  Nursing note and vital signs reviewed.  Physical Exam Constitutional:      General: He is not in acute distress.    Appearance: He is well-developed.  Eyes:     Conjunctiva/sclera: Conjunctivae normal.  Neck:     Musculoskeletal: Neck supple.  Cardiovascular:     Rate and Rhythm: Normal rate and regular rhythm.     Heart sounds: Normal heart sounds. No murmur. No friction rub. No gallop.   Pulmonary:     Effort: Pulmonary effort is normal. No respiratory distress.      Breath sounds: Normal breath sounds. No wheezing or rales.  Chest:     Chest wall: No tenderness.  Abdominal:     General: Bowel sounds are normal.     Palpations: Abdomen is soft.     Tenderness: There is no abdominal tenderness.  Lymphadenopathy:     Cervical: No cervical adenopathy.  Skin:    General: Skin is warm and dry.     Findings: No rash.  Neurological:  Mental Status: He is alert and oriented to person, place, and time.  Psychiatric:        Behavior: Behavior normal.        Thought Content: Thought content normal.        Judgment: Judgment normal.         Assessment & Plan:   Problem List Items Addressed This Visit      Other   HIV disease ([place]) - Primary    Christopher Hart has well controlled HIV disease with good adherence and tolerance to his ART regimen of Atripla. He has no signs/symptoms of opportunistic infection or progressive HIV disease at present. We discussed medication options for him as there are better choices and given renal function would change him to [facility]. He is in agreement to try Biktarvy. Plan to follow up in 1 month or sooner if needed to recheck lab work. He is considering following up with Dr. Delena Bali.       Relevant Medications   fluconazole (DIFLUCAN) 200 MG tablet   bictegravir-emtricitabine-tenofovir AF (BIKTARVY) 50-200-25 MG TABS tablet   Healthcare maintenance       I have discontinued Christopher Hart's Atripla. I am also having him start on bictegravir-emtricitabine-tenofovir AF. Additionally, I am having him maintain his ALPRAZolam, citalopram, lisinopril-hydrochlorothiazide, metoprolol succinate, buPROPion, aspirin, atorvastatin, fluconazole, and omeprazole.   Meds ordered this encounter  Medications  . bictegravir-emtricitabine-tenofovir AF (BIKTARVY) 50-200-25 MG TABS tablet    Sig: Take 1 tablet by mouth daily.    Dispense:  30 tablet    Refill:  2    Order Specific Question:   Supervising Provider    Answer:   Carlyle Basques [[id number]]     Follow-up: Return in about 1 month (around 11/21/2018), or if symptoms worsen or fail to improve.    Terri Piedra, MSN, FNP-C Nurse Practitioner [facility] for Infectious Disease [facility] Office phone: [phone] Pager: [place] number: [phone]

## 2018-10-21 NOTE — Progress Notes (Signed)
HPI: Christopher Hart is a 58 y.o. male who presents to the [facility] clinic for HIV follow-up. He is currently virally suppressed, on Atripla, managed by another office (PCP Dr. Delena Bali). He was referred to our office when his HIV RNA VL increased from ~20 to ~50.  Patient Active Problem List   Diagnosis Date Noted  . NSTEMI (non-ST elevated myocardial infarction) ([place]) 06/12/2017  . Hypertension 04/01/2012  . HIV disease ([place]) 04/01/2012  . Allergic rhinitis, mild 04/01/2012  . Obesity (BMI 35.0-39.9 without comorbidity) 04/01/2012    Patient's Medications  New Prescriptions   No medications on file  Previous Medications   ALPRAZOLAM (XANAX) 0.5 MG TABLET    Take 0.5 mg 2 (two) times daily by mouth.   ASPIRIN 81 MG CHEWABLE TABLET    Chew 1 tablet (81 mg total) daily by mouth.   ATORVASTATIN (LIPITOR) 40 MG TABLET    Take 0.5 tablets (20 mg total) daily by mouth.   BICTEGRAVIR-EMTRICITABINE-TENOFOVIR AF (BIKTARVY) 50-200-25 MG TABS TABLET    Take 1 tablet by mouth daily.   BUPROPION (WELLBUTRIN SR) 150 MG 12 HR TABLET    Take 150 mg daily by mouth.   CITALOPRAM (CELEXA) 40 MG TABLET    Take 40 mg daily by mouth.   FLUCONAZOLE (DIFLUCAN) 200 MG TABLET       LISINOPRIL-HYDROCHLOROTHIAZIDE (PRINZIDE,ZESTORETIC) 20-12.5 MG TABLET    Take 2 tablets daily by mouth.   METOPROLOL SUCCINATE (TOPROL-XL) 100 MG 24 HR TABLET    Take 1 tablet daily by mouth.   OMEPRAZOLE (PRILOSEC) 20 MG CAPSULE      Modified Medications   No medications on file  Discontinued Medications   No medications on file    Allergies: Allergies  Allergen Reactions  . Penicillins     Whelps    Past Medical History: Past Medical History:  Diagnosis Date  . Allergy   . BCE (basal cell epithelioma)   . HIV infection ([place])   . HTN (hypertension)   . Obesity     Social History: Social History   Socioeconomic History  . Marital status: Single    Spouse name: Not on file  . Number of children: Not on  file  . Years of education: Not on file  . Highest education level: Not on file  Occupational History  . Occupation: Unemployed   Social Needs  . Financial resource strain: Not on file  . Food insecurity:    Worry: Not on file    Inability: Not on file  . Transportation needs:    Medical: Not on file    Non-medical: Not on file  Tobacco Use  . Smoking status: Never Smoker  . Smokeless tobacco: Never Used  Substance and Sexual Activity  . Alcohol use: Yes    Comment: rare  . Drug use: No  . Sexual activity: Not Currently  Lifestyle  . Physical activity:    Days per week: Not on file    Minutes per session: Not on file  . Stress: Not on file  Relationships  . Social connections:    Talks on phone: Not on file    Gets together: Not on file    Attends religious service: Not on file    Active member of club or organization: Not on file    Attends meetings of clubs or organizations: Not on file    Relationship status: Not on file  Other Topics Concern  . Not on file  Social History  Narrative  . Not on file    Labs: Lab Results  Component Value Date   HIV1RNAQUANT 23 (H) 09/10/2018   HIV1RNAQUANT <20 04/01/2012   CD4TABS 1,680 09/10/2018    RPR and STI Lab Results  Component Value Date   LABRPR NON-REACTIVE 09/10/2018    STI Results GC CT  09/10/2018 Negative Negative    Hepatitis B Lab Results  Component Value Date   HEPBSAB NON-REACTIVE 09/10/2018   HEPBSAG NON-REACTIVE 09/10/2018   HEPBCAB NON-REACTIVE 09/10/2018   Hepatitis C Lab Results  Component Value Date   HEPCAB NON-REACTIVE 09/10/2018   Hepatitis A Lab Results  Component Value Date   HAV NON-REACTIVE 09/10/2018   Lipids: Lab Results  Component Value Date   CHOL 192 09/10/2018   TRIG 260 (H) 09/10/2018   HDL 37 (L) 09/10/2018   CHOLHDL 5.2 (H) 09/10/2018   VLDL 20 06/12/2017   LDLCALC 118 (H) 09/10/2018    Current HIV Regimen: Atripla  Assessment: Christopher Hart is a 58 y.o.  male with HIV, diagnosed in 2002,  currently virally suppressed, on Atripla, managed by his PCP's office. He was referred to our office when his HIV RNA VL increased from ~20 to ~50. Testing in 2002 prompted by a diagnosis of HIV infection in someone he was dating. Patient reports he has has been in care and on ART continuously since diagnosis. He could not remember his past regminens, but thought he had been on Sustiva plus another medication prior to transitioning to Atripla.  Patient reported close to 100% adherence on Atripla.  He reported that he tolerates the medication well, but he does experience vivid dreams and nightmares.  He did report that he had ~8 bottles of Atripla "laying around". When asked about how he had such a large supply with the reported high adherence, he reported a past rental living situation that had black mold, and the situation made him extremely nauseous, so he wasn't taking his medications at that time.   Patient denies fevers, chills, or sweating. Patient denied any ETOH use, tobacco use, or illicit drug use.  Patient confirmed his insurance as [employer], and his pharmacy as [facility] [place]. He reported he was required to use the mail-order specialty pharmacy for ART.   After discussing HIV resistance patterns, drug-related side effects, and drug-drug interactions of his current regimen, Atripla, compared to newer alternatives that are less likely to develop resistance, have an improved side effect profile, and fewer drug-drug interactions, patient agreed to switch ART regimen to [place].  Pharmacist confirmed patient has no contraindications to [facility], and has adequate renal and hepatic function for therapy.  Pharmacist completed counseling on [place], reviewing adherence, side effects, and addressing any patient questions.  Pharmacist reviewed patient medications, and found no drug-drug interactions with Biktarvy.   Patient did ask about his blood  pressure reading today (153/104). He confirmed that he is still taking lisinopril-HCTZ and metoprolol succinate, and that he had already taken his daily dose this morning. He did admit to some stress, and possible white coat syndrome. He will follow-up with PCP regarding blood pressure management.   Plan: - Discontinue Atripla. - Start [place], one tablet by mouth once daily. - Prescription for [employer] sent to patient's pharmacy. - Follow-up in ~1 month with Terri Piedra or PCP to assess HIV RNA VL, CMP, adherence and tolerability of new regimen.  Alden Benjamin, PharmD Candidate, [facility] for Infectious Disease 10/21/2018, 3:42 PM

## 2018-10-21 NOTE — Telephone Encounter (Signed)
[facility] Patient Advocate Encounter    Findings of the benefits investigation conducted this morning via test claims for the patient's upcoming appointment on 10/21/2018 @ 15:15 are as follows:   Insurance: Advance- active status  Test run with drug historically used, will run another test claim if new drug prescribed (new diagnosis) Estimated copay amount: $23.33 (tested with brand medication) Prior Authorization: not required at this time, will conduct if needed  Patient insurance response stated only allowed 1 retail medication fill per calendar year.  Sometimes overrides are available to avoid having to use mail order and allow patient to pick up locally, will discuss with patient at appointment.  Bartholomew Crews, CPhT [facility] Patient [facility] Phone: [phone] Fax: [phone] 10/21/2018 10:23 AM

## 2018-10-21 NOTE — Patient Instructions (Signed)
Nice to meet you!  We will change your medication to [facility].   Plan to follow up in 1 month or sooner if needed to recheck your viral load.  Let us know if you will be following with Dr. Delena Bali or here.  Have a great day!

## 2018-11-26 ENCOUNTER — Ambulatory Visit: Payer: 59 | Admitting: Family

## 2020-07-28 DIAGNOSIS — S62316A Displaced fracture of base of fifth metacarpal bone, right hand, initial encounter for closed fracture: Secondary | ICD-10-CM | POA: Diagnosis not present

## 2020-08-20 DIAGNOSIS — S62316A Displaced fracture of base of fifth metacarpal bone, right hand, initial encounter for closed fracture: Secondary | ICD-10-CM | POA: Diagnosis not present

## 2020-12-24 ENCOUNTER — Other Ambulatory Visit: Payer: Self-pay

## 2020-12-24 ENCOUNTER — Observation Stay
Admission: EM | Admit: 2020-12-24 | Discharge: 2020-12-25 | Disposition: A | Discharge status: Home / Self Care | Payer: BC Managed Care – PPO | Attending: Internal Medicine | Admitting: Internal Medicine

## 2020-12-24 ENCOUNTER — Emergency Department: Payer: BC Managed Care – PPO

## 2020-12-24 ENCOUNTER — Observation Stay: Payer: BC Managed Care – PPO

## 2020-12-24 ENCOUNTER — Encounter: Payer: Self-pay | Admitting: Emergency Medicine

## 2020-12-24 DIAGNOSIS — R29810 Facial weakness: Secondary | ICD-10-CM | POA: Diagnosis present

## 2020-12-24 DIAGNOSIS — K219 Gastro-esophageal reflux disease without esophagitis: Secondary | ICD-10-CM | POA: Diagnosis not present

## 2020-12-24 DIAGNOSIS — Z79899 Other long term (current) drug therapy: Secondary | ICD-10-CM | POA: Insufficient documentation

## 2020-12-24 DIAGNOSIS — G9389 Other specified disorders of brain: Secondary | ICD-10-CM

## 2020-12-24 DIAGNOSIS — Z85828 Personal history of other malignant neoplasm of skin: Secondary | ICD-10-CM | POA: Diagnosis not present

## 2020-12-24 DIAGNOSIS — N1831 Chronic kidney disease, stage 3a: Secondary | ICD-10-CM | POA: Diagnosis not present

## 2020-12-24 DIAGNOSIS — I129 Hypertensive chronic kidney disease with stage 1 through stage 4 chronic kidney disease, or unspecified chronic kidney disease: Secondary | ICD-10-CM | POA: Insufficient documentation

## 2020-12-24 DIAGNOSIS — F418 Other specified anxiety disorders: Secondary | ICD-10-CM | POA: Diagnosis not present

## 2020-12-24 DIAGNOSIS — I639 Cerebral infarction, unspecified: Secondary | ICD-10-CM

## 2020-12-24 DIAGNOSIS — Z20822 Contact with and (suspected) exposure to covid-19: Secondary | ICD-10-CM | POA: Diagnosis not present

## 2020-12-24 DIAGNOSIS — I10 Essential (primary) hypertension: Secondary | ICD-10-CM | POA: Diagnosis present

## 2020-12-24 DIAGNOSIS — R900 Intracranial space-occupying lesion found on diagnostic imaging of central nervous system: Secondary | ICD-10-CM | POA: Diagnosis not present

## 2020-12-24 DIAGNOSIS — G319 Degenerative disease of nervous system, unspecified: Secondary | ICD-10-CM | POA: Diagnosis not present

## 2020-12-24 DIAGNOSIS — Z7982 Long term (current) use of aspirin: Secondary | ICD-10-CM | POA: Diagnosis not present

## 2020-12-24 DIAGNOSIS — E785 Hyperlipidemia, unspecified: Secondary | ICD-10-CM

## 2020-12-24 DIAGNOSIS — N183 Chronic kidney disease, stage 3 unspecified: Secondary | ICD-10-CM | POA: Diagnosis present

## 2020-12-24 DIAGNOSIS — G510 Bell's palsy: Secondary | ICD-10-CM | POA: Diagnosis not present

## 2020-12-24 DIAGNOSIS — E782 Mixed hyperlipidemia: Secondary | ICD-10-CM | POA: Diagnosis present

## 2020-12-24 DIAGNOSIS — I2510 Atherosclerotic heart disease of native coronary artery without angina pectoris: Secondary | ICD-10-CM | POA: Diagnosis not present

## 2020-12-24 DIAGNOSIS — B20 Human immunodeficiency virus [HIV] disease: Secondary | ICD-10-CM | POA: Diagnosis present

## 2020-12-24 DIAGNOSIS — I25110 Atherosclerotic heart disease of native coronary artery with unstable angina pectoris: Secondary | ICD-10-CM | POA: Diagnosis present

## 2020-12-24 HISTORY — DX: Bell's palsy: G51.0

## 2020-12-24 LAB — DIFFERENTIAL
Abs Immature Granulocytes: 0.02 10*3/uL (ref 0.00–0.07)
Basophils Absolute: 0.1 10*3/uL (ref 0.0–0.1)
Basophils Relative: 1 %
Eosinophils Absolute: 0.3 10*3/uL (ref 0.0–0.5)
Eosinophils Relative: 3 %
Immature Granulocytes: 0 %
Lymphocytes Relative: 19 %
Lymphs Abs: 1.7 10*3/uL (ref 0.7–4.0)
Monocytes Absolute: 0.6 10*3/uL (ref 0.1–1.0)
Monocytes Relative: 7 %
Neutro Abs: 6.4 10*3/uL (ref 1.7–7.7)
Neutrophils Relative %: 70 %

## 2020-12-24 LAB — URINE DRUG SCREEN, QUALITATIVE (ARMC ONLY)
Amphetamines, Ur Screen: NOT DETECTED
Barbiturates, Ur Screen: NOT DETECTED
Benzodiazepine, Ur Scrn: POSITIVE — AB
Cannabinoid 50 Ng, Ur ~~LOC~~: NOT DETECTED
Cocaine Metabolite,Ur ~~LOC~~: NOT DETECTED
MDMA (Ecstasy)Ur Screen: NOT DETECTED
Methadone Scn, Ur: NOT DETECTED
Opiate, Ur Screen: NOT DETECTED
Phencyclidine (PCP) Ur S: NOT DETECTED
Tricyclic, Ur Screen: NOT DETECTED

## 2020-12-24 LAB — COMPREHENSIVE METABOLIC PANEL
ALT: 23 U/L (ref 0–44)
AST: 20 U/L (ref 15–41)
Albumin: 3.7 g/dL (ref 3.5–5.0)
Alkaline Phosphatase: 116 U/L (ref 38–126)
Anion gap: 10 (ref 5–15)
BUN: 28 mg/dL — ABNORMAL HIGH (ref 8–23)
CO2: 20 mmol/L — ABNORMAL LOW (ref 22–32)
Calcium: 8.8 mg/dL — ABNORMAL LOW (ref 8.9–10.3)
Chloride: 103 mmol/L (ref 98–111)
Creatinine, Ser: 1.49 mg/dL — ABNORMAL HIGH (ref 0.61–1.24)
GFR, Estimated: 53 mL/min — ABNORMAL LOW (ref >60)
Glucose, Bld: 109 mg/dL — ABNORMAL HIGH (ref 70–99)
Potassium: 3.8 mmol/L (ref 3.5–5.1)
Sodium: 133 mmol/L — ABNORMAL LOW (ref 135–145)
Total Bilirubin: 0.5 mg/dL (ref 0.3–1.2)
Total Protein: 7.4 g/dL (ref 6.5–8.1)

## 2020-12-24 LAB — PROTIME-INR
INR: 1 (ref 0.8–1.2)
Prothrombin Time: 13.5 seconds (ref 11.4–15.2)

## 2020-12-24 LAB — CBC
HCT: 43.1 % (ref 39.0–52.0)
Hemoglobin: 14.9 g/dL (ref 13.0–17.0)
MCH: 28 pg (ref 26.0–34.0)
MCHC: 34.6 g/dL (ref 30.0–36.0)
MCV: 81 fL (ref 80.0–100.0)
Platelets: 363 10*3/uL (ref 150–400)
RBC: 5.32 MIL/uL (ref 4.22–5.81)
RDW: 12.4 % (ref 11.5–15.5)
WBC: 9.1 10*3/uL (ref 4.0–10.5)
nRBC: 0 % (ref 0.0–0.2)

## 2020-12-24 LAB — RESP PANEL BY RT-PCR (FLU A&B, COVID) ARPGX2
Influenza A by PCR: NEGATIVE
Influenza B by PCR: NEGATIVE
SARS Coronavirus 2 by RT PCR: NEGATIVE

## 2020-12-24 LAB — APTT: aPTT: 37 seconds — ABNORMAL HIGH (ref 24–36)

## 2020-12-24 IMAGING — CT CT HEAD W/O CM
3 series · 15 of 47 positions shown, 18 images · non-contrast
Comparison: [DATE]

CLINICAL DATA: Facial weakness which began about a week ago.
Left-sided facial droop. Some speech disturbance.

EXAM:
CT HEAD WITHOUT CONTRAST
TECHNIQUE: Contiguous axial images were obtained from the base of the skull
through the vertex without intravenous contrast.

[Series 2: head wo · axial · 0.48mm/px · z∈[-152,-17]mm · 9 of 33 slices shown, 12 images]
[im 3/33  brain]
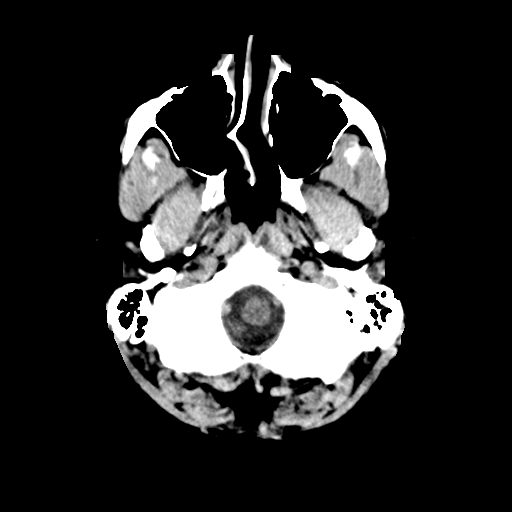
[im 3/33  bone]
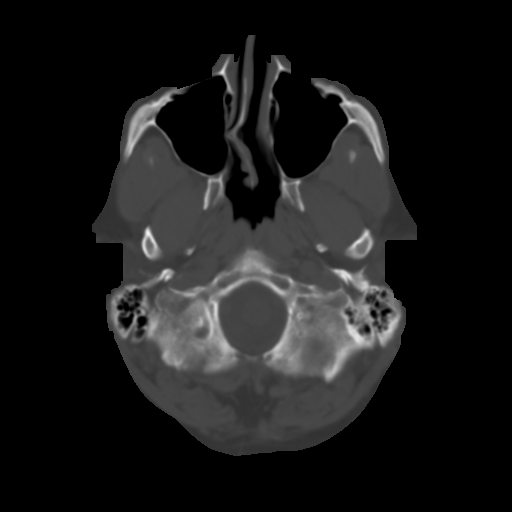
[im 6/33  brain]
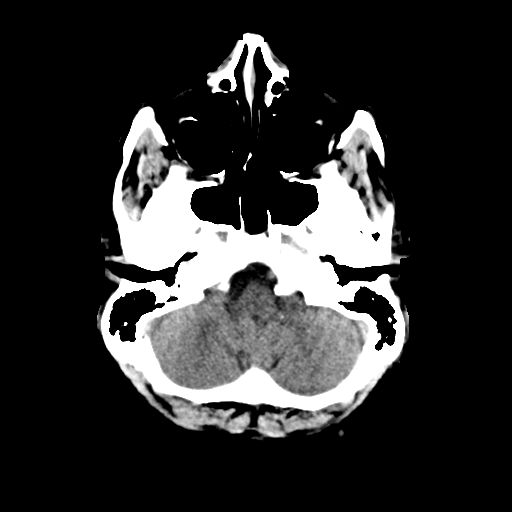
[im 9/33  brain]
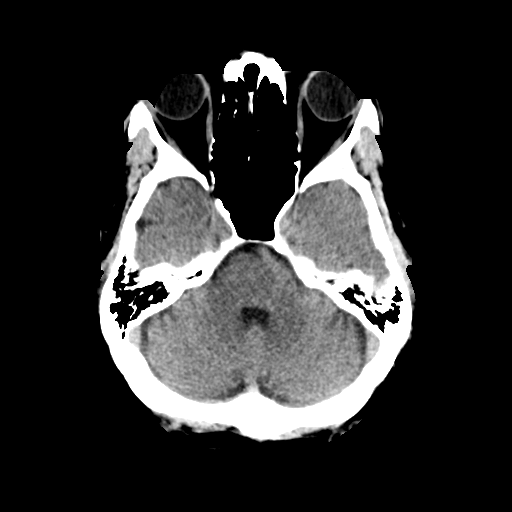
[im 13/33  brain]
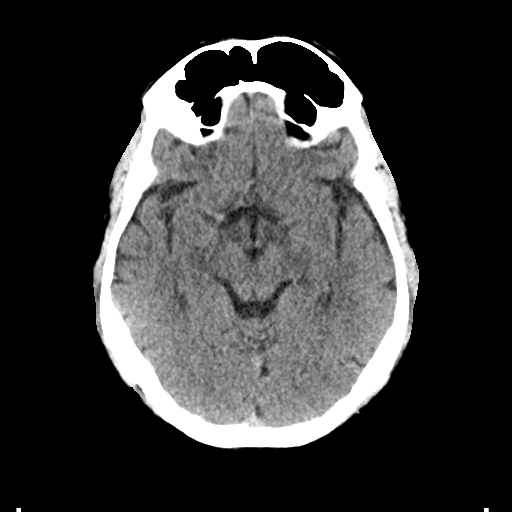
[im 17/33  brain]
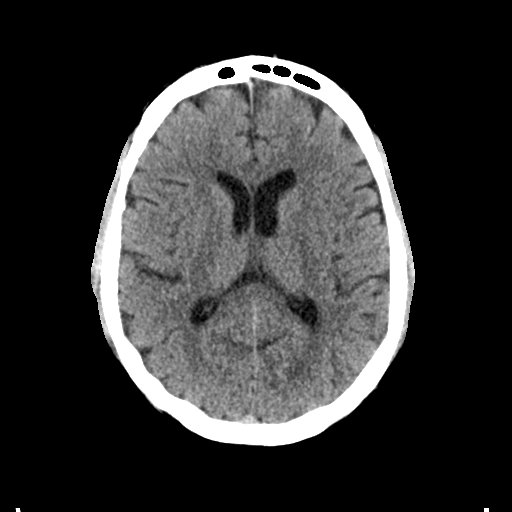
[im 17/33  bone]
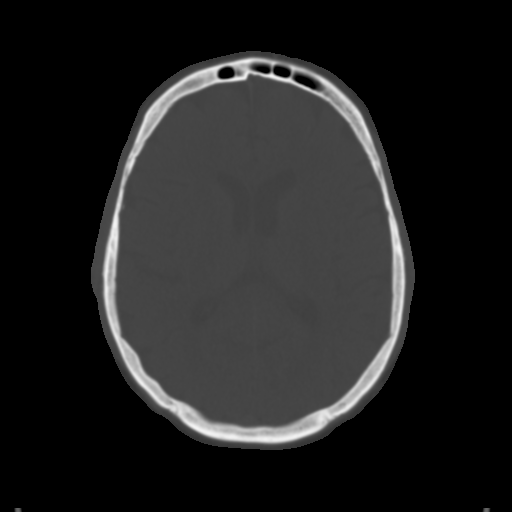
[im 20/33  brain]
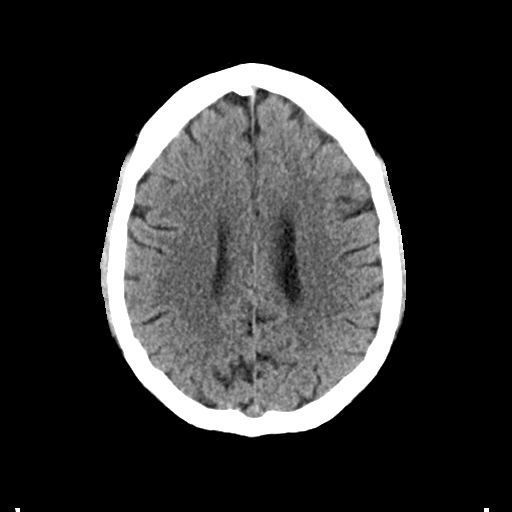
[im 24/33  brain]
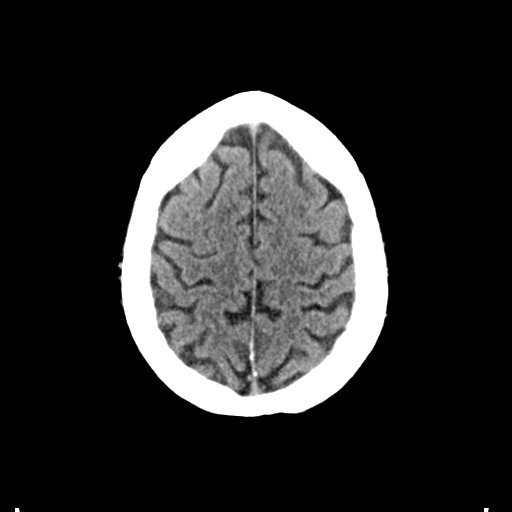
[im 27/33  brain]
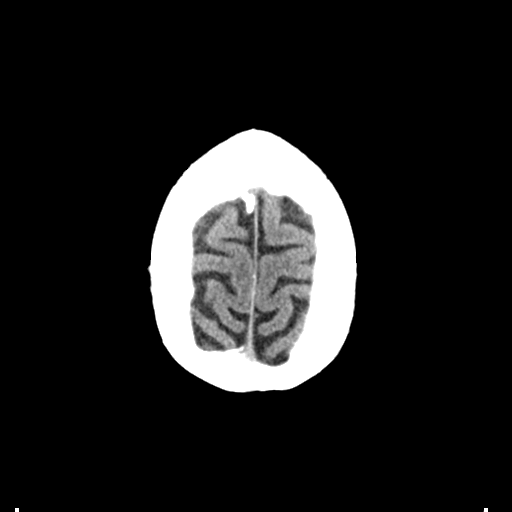
[im 30/33  brain]
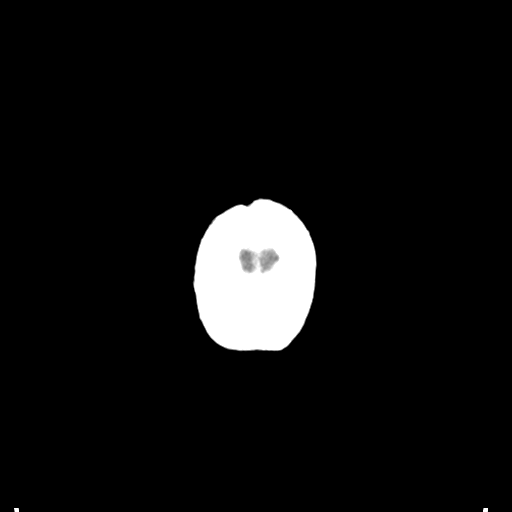
[im 30/33  bone]
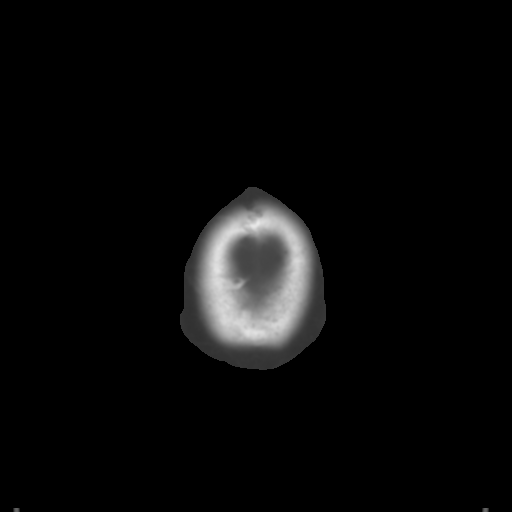

[Series 4: coronal soft tissue · coronal · 0.30mm/px · 3 of 70 slices shown]
[im 24/70  brain]
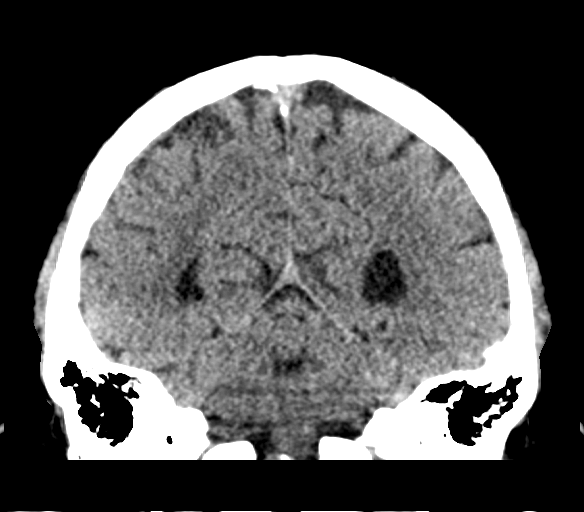
[im 31/70  brain]
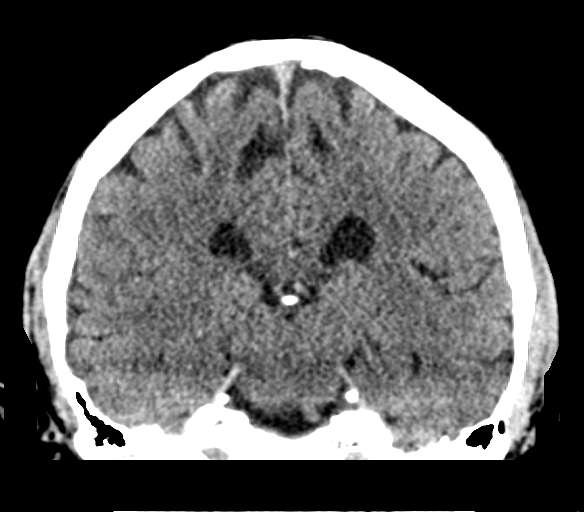
[im 39/70  brain]
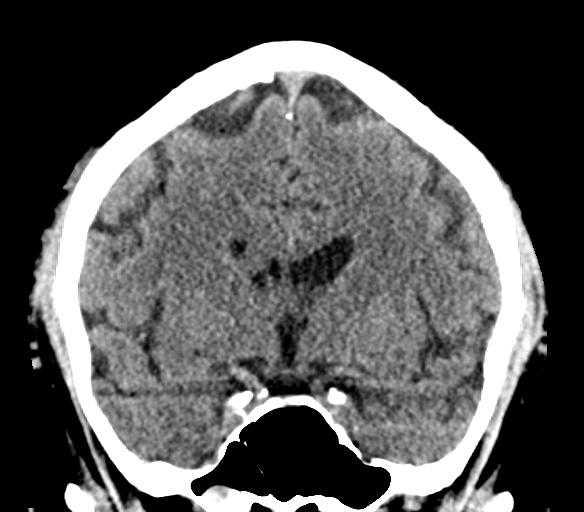

[Series 5: sagittal soft tissue · sagittal · 0.30mm/px · 3 of 58 slices shown]
[im 20/58  brain]
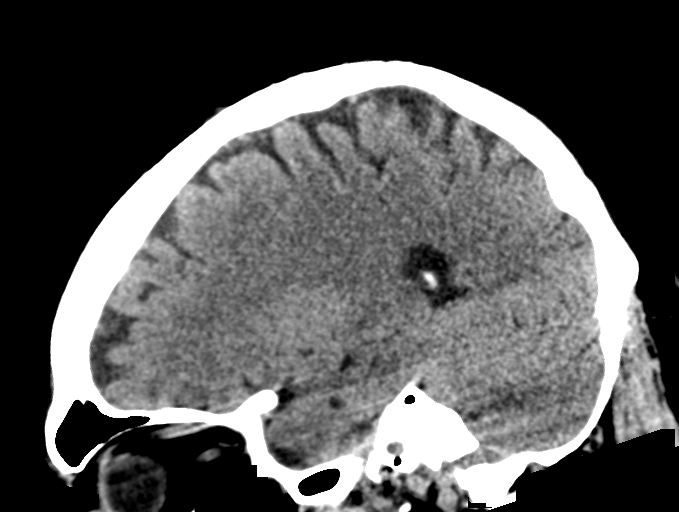
[im 29/58  brain]
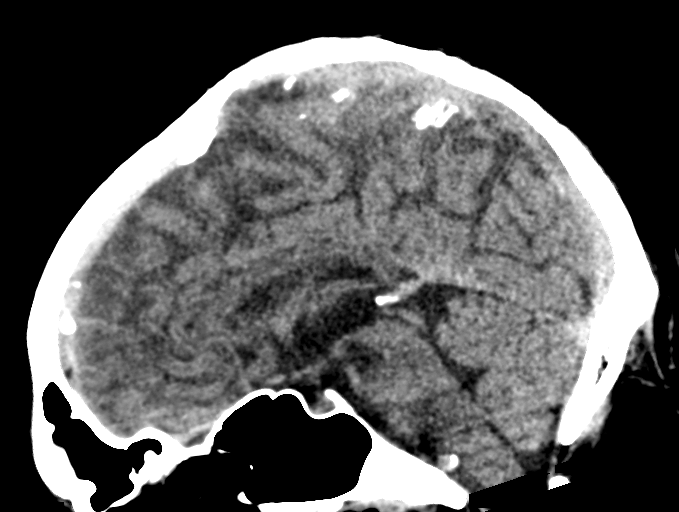
[im 39/58  brain]
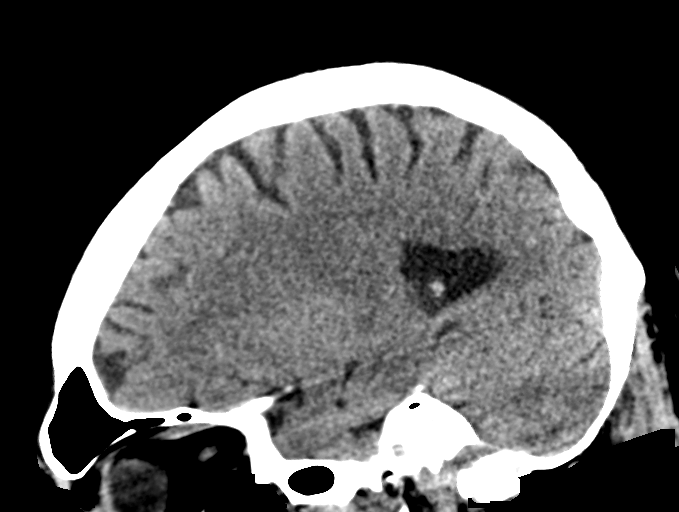

[15 of 47 positions shown; findings below may reference images not displayed]

FINDINGS: Brain: Brain parenchyma shows mild age related volume loss. No
evidence of old or acute focal infarction. No intra-axial mass
lesion, hemorrhage, hydrocephalus or extra-axial collection. There
is an intraventricular mass in the right lateral ventricle measuring
8 mm in diameter. This was present in [R7] and is either the same or
very minimally larger. The differential diagnosis is
intraventricular meningioma versus is sub ependymoma. Neither should
be significant given the absence of enlargement. Consider MRI of the
brain with and without contrast for further characterization.

Vascular: No abnormal vascular finding.

Skull: Normal

Sinuses/Orbits: Clear/normal

Other: None
IMPRESSION: No evidence stroke by CT.

8 mm right sided intraventricular or subependymal mass of the right
lateral ventricle, very similar to the study [DATE]. This
argues against any progressive or aggressive diagnosis. Most likely
diagnosis is subependymoma, though intraventricular meningioma is
possible. Consider MRI with and without contrast for further
evaluation.

## 2020-12-24 IMAGING — MR MR HEAD WO/W CM
14 series · 48 of 48 positions shown · IV contrast (10ml Gadavist)
Comparison: Head CT [DATE] and [DATE]

CLINICAL DATA: Left facial droop and difficulty speaking. Brain
mass.

EXAM:
MRI HEAD WITHOUT AND WITH CONTRAST
TECHNIQUE: Multiplanar, multiecho pulse sequences of the brain and surrounding
structures were obtained without and with intravenous contrast.
CONTRAST:  10mL GADAVIST GADOBUTROL 1 MMOL/ML IV SOLN

[Series 5: ax dwi_tracew · axial · 3.0mm · 0.71mm/px · z∈[-51,+107]mm · 4 of 56 slices shown]
[im 1/56]
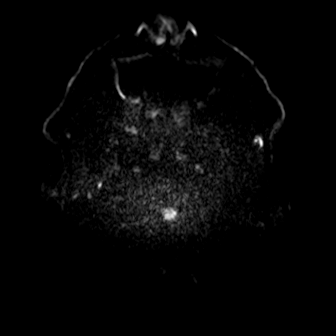
[im 19/56]
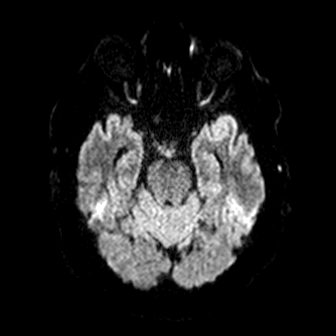
[im 37/56]
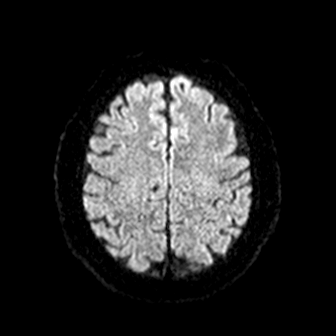
[im 56/56]
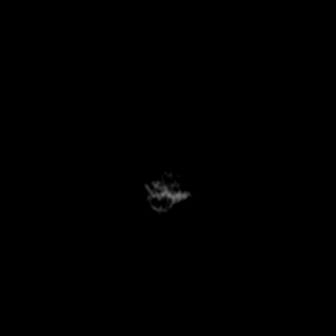

[Series 6: ax dwi_adc · axial · 3.0mm · 0.71mm/px · z∈[-51,+107]mm · 3 of 56 slices shown]
[im 1/56]
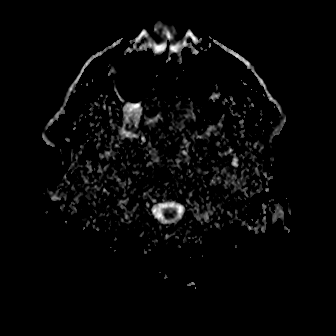
[im 28/56]
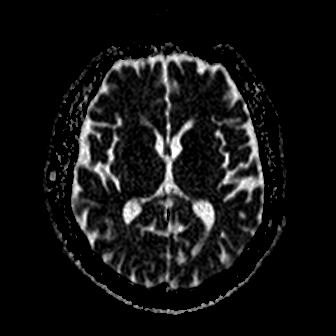
[im 56/56]
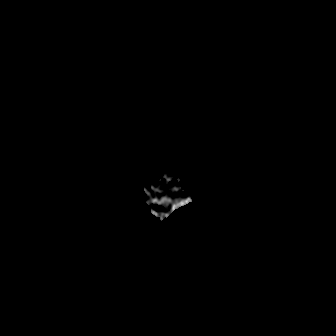

[Series 7: cor dwi_tracew · coronal · 5.0mm · 0.68mm/px · 2 of 40 slices shown]
[im 1/40]
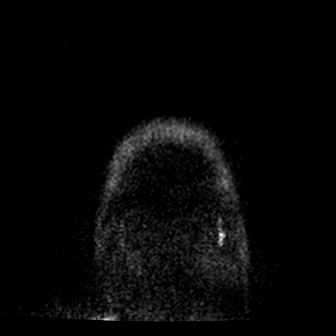
[im 40/40]
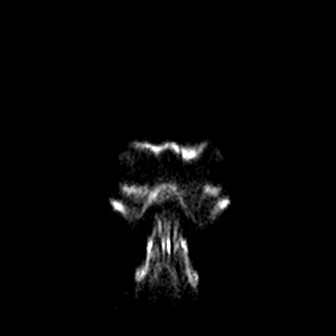

[Series 8: cor dwi_adc · coronal · 5.0mm · 0.68mm/px · 2 of 40 slices shown]
[im 1/40]
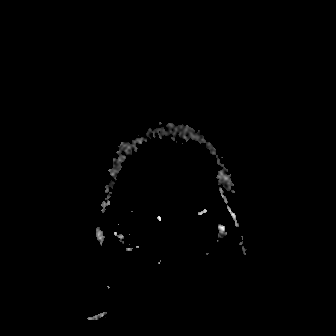
[im 40/40]
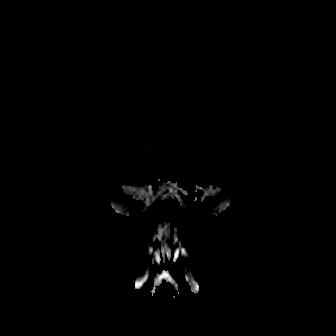

[Series 9: T1 · sagittal · 5.0mm · 0.47mm/px · 1 of 24 slices shown (1 of 2)]
[im 1/24]
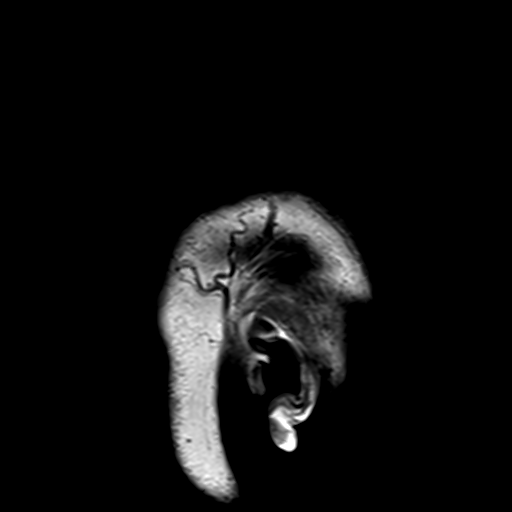

[Series 10: T2 · axial · 5.0mm · 0.86mm/px · z∈[-49,+100]mm · 2 of 27 slices shown (1 of 2)]
[im 1/27]
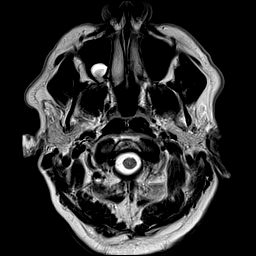
[im 27/27]
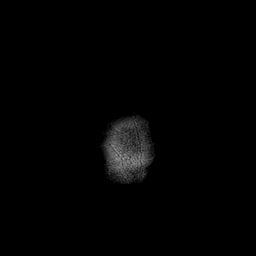

[Series 12: pha_images · axial · 3.0mm · 0.90mm/px · z∈[-46,+100]mm · 3 of 52 slices shown]
[im 1/52]
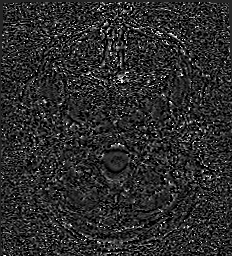
[im 26/52]
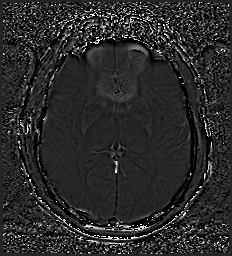
[im 52/52]
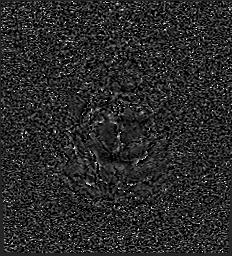

[Series 13: swi_images · axial · 3.0mm · 0.90mm/px · z∈[-46,+100]mm · 3 of 52 slices shown]
[im 1/52]
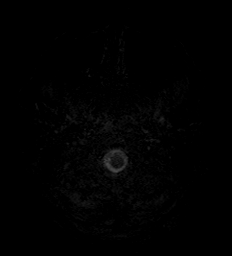
[im 26/52]
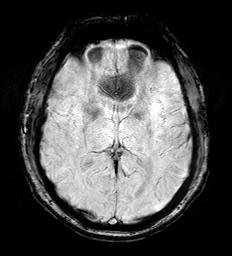
[im 52/52]
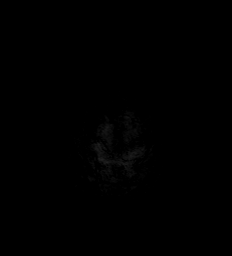

[Series 15: FLAIR · axial · 3.0mm · 0.69mm/px · z∈[-52,+103]mm · 3 of 55 slices shown]
[im 1/55]
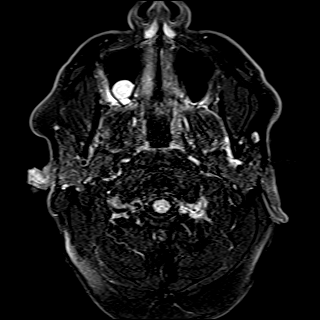
[im 28/55]
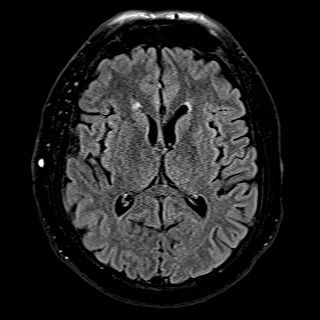
[im 55/55]
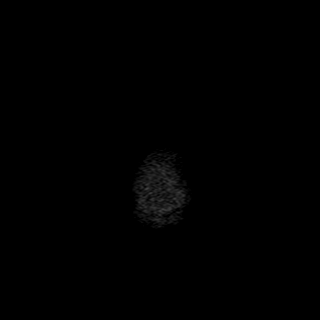

[Series 16: T1 · axial · 1.0mm · 0.98mm/px · z∈[-52,+115]mm · 10 of 176 slices shown (2 of 2)]
[im 1/176]
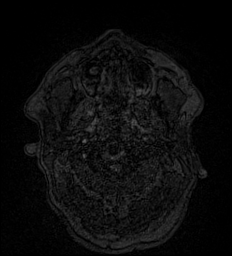
[im 20/176]
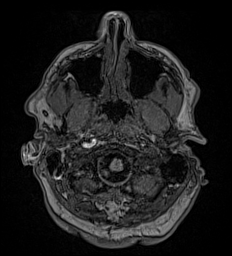
[im 39/176]
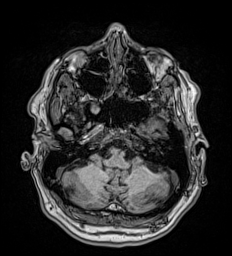
[im 59/176]
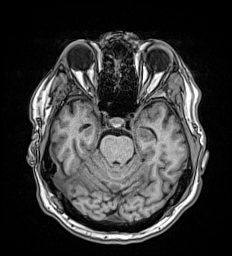
[im 78/176]
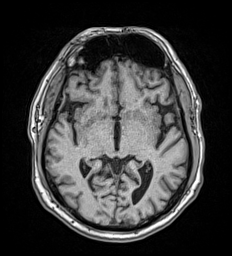
[im 98/176]
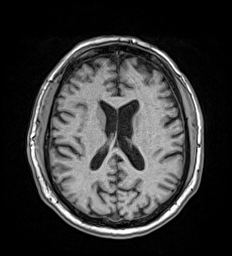
[im 117/176]
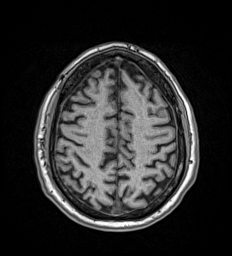
[im 137/176]
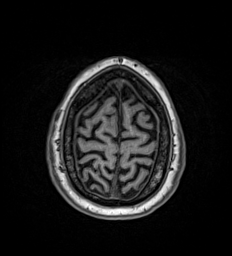
[im 156/176]
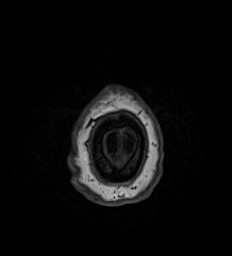
[im 176/176]
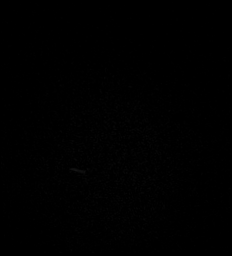

[Series 17: T2 · coronal · 5.0mm · 0.86mm/px · 2 of 30 slices shown (2 of 2)]
[im 1/30]
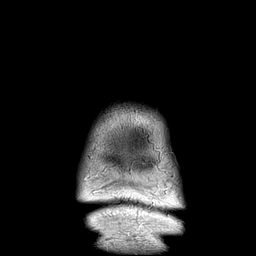
[im 30/30]
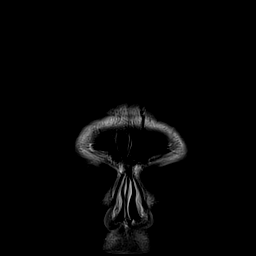

[Series 18: T1 post-contrast · axial · 1.0mm · 0.98mm/px · z∈[-52,+115]mm · 10 of 176 slices shown (1 of 3)]
[im 1/176]
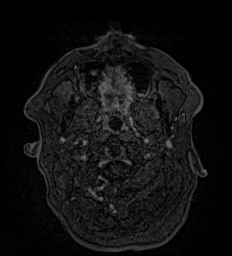
[im 20/176]
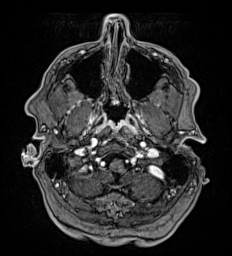
[im 39/176]
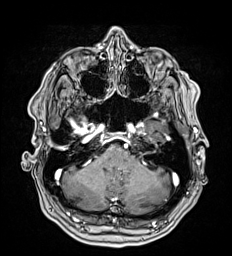
[im 59/176]
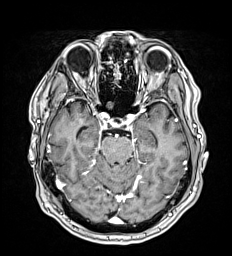
[im 78/176]
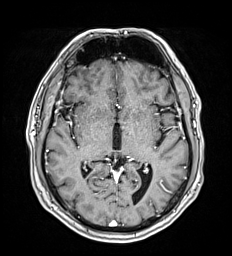
[im 98/176]
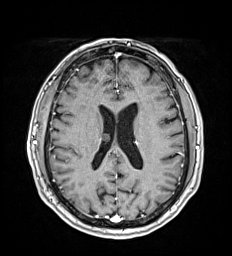
[im 117/176]
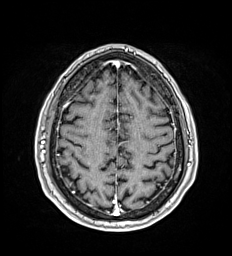
[im 137/176]
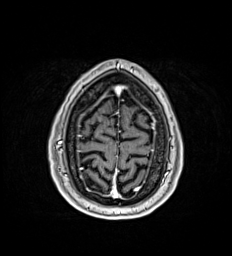
[im 156/176]
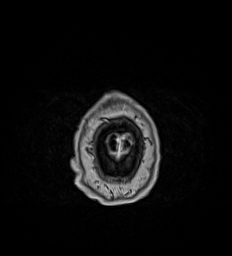
[im 176/176]
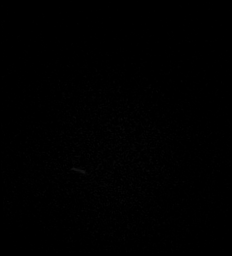

[Series 19: T1 post-contrast · coronal · 5.0mm · 0.43mm/px · 2 of 30 slices shown (2 of 3)]
[im 1/30]
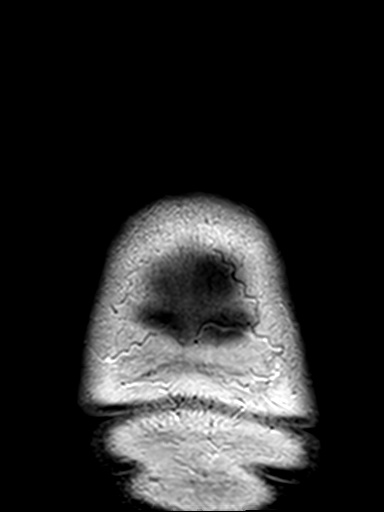
[im 30/30]
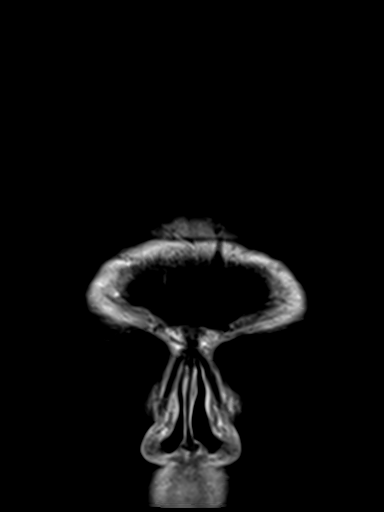

[Series 20: T1 post-contrast · sagittal · 5.0mm · 0.47mm/px · 1 of 24 slices shown (3 of 3)]
[im 1/24]
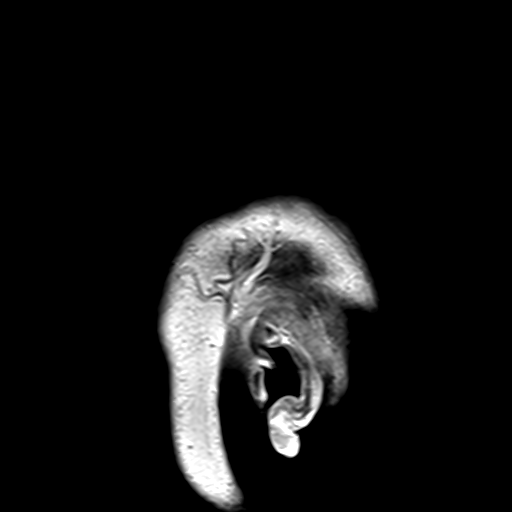

[48 of 48 positions shown; findings below may reference images not displayed]

FINDINGS: Brain: No acute infarct, intracranial hemorrhage, midline shift, or
extra-axial fluid collection is identified. The brain itself is
normal in signal. Mild cerebral atrophy is within normal limits for
age. There is a 9 mm T2 and FLAIR hyperintense, nonenhancing mass in
the body of the right lateral ventricle which is unchanged in size
from [FY] and is not attached to the septum pellucidum.

Vascular: Major intracranial vascular flow voids are preserved.

Skull and upper cervical spine: Unremarkable bone marrow signal.

Sinuses/Orbits: Bilateral cataract extraction. Small mucous
retention cysts in the right maxillary and right sphenoid sinuses.
Small right mastoid effusion.

Other: None.
IMPRESSION: 1. No acute intracranial abnormality.
2. Small nonenhancing mass in the right lateral ventricle most
consistent with a subependymoma.

## 2020-12-24 MED ORDER — GADOBUTROL 1 MMOL/ML IV SOLN
10.0000 mL | Freq: Once | INTRAVENOUS | Status: AC | PRN
  Administered 2020-12-24: 10 mL via INTRAVENOUS

## 2020-12-24 MED ORDER — ATORVASTATIN CALCIUM 20 MG PO TABS
40.0000 mg | ORAL_TABLET | Freq: Every day | ORAL | Status: DC
  Administered 2020-12-24 – 2020-12-25 (×2): 40 mg via ORAL
  Filled 2020-12-24 (×2): qty 2

## 2020-12-24 MED ORDER — ACETAMINOPHEN 325 MG PO TABS
650.0000 mg | ORAL_TABLET | Freq: Four times a day (QID) | ORAL | Status: DC | PRN
  Administered 2020-12-25: 650 mg via ORAL
  Filled 2020-12-24: qty 2

## 2020-12-24 MED ORDER — ASPIRIN 81 MG PO CHEW
324.0000 mg | CHEWABLE_TABLET | Freq: Once | ORAL | Status: DC
  Administered 2020-12-24: 324 mg via ORAL
  Filled 2020-12-24: qty 4

## 2020-12-24 NOTE — ED Notes (Signed)
Pt speaking to MRI on mobile phone

## 2020-12-24 NOTE — H&P (Signed)
History and Physical    Christopher Hart VHQ:[id number] DOB: 08-Nov-1960 DOA: 12/24/2020  Referring MD/NP/PA:   PCP: Nicoletta Dress, MD   Patient coming from:  The patient is coming from home.  At baseline, pt is independent for most of ADL.        Chief Complaint: Left facial droop, difficulty speaking  HPI: Christopher Hart is a 60 y.o. male with medical history significant of brain mass, HIV (CD4=1689, VL 23 on 09/10/18), HTN, HLD, CAD, CKD-IIIa, depression with anxiety, GERD, who presents with left facial droop and difficulty speaking.  Patient states that his symptoms started 6 days ago, including left facial droop and difficult speaking and finding his words. Pt states that he is unable to make a smile and unable to form his mouth around a glass or bottle to drink water.  No weakness, numbness or tingling in the extremities. Patient denies chest pain, cough, shortness of breath, fever or chills.  No nausea, vomiting, diarrhea or abdominal pain.  No symptoms of UTI.  ED Course: pt was found to have WBC 9.1, INR 1.0, PTT 37, pending COVID-19 PCR, renal function stable, temperature normal, blood pressure 98/75, heart rate 78, RR 20, oxygen saturation 97% on room air.  Patient is placed on MedSurg bed for observation.  Dr. Cheral Marker of neurology is consulted.  CT-head showed: No evidence stroke by CT.  8 mm right sided intraventricular or subependymal mass of the right lateral ventricle, very similar to the study of August 2018. This argues against any progressive or aggressive diagnosis. 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 Consider MRI with and without contrast for further Evaluation.  MRI-brain w/ and w/o contrast: 1. No acute intracranial abnormality. 2. 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  Review of Systems:   General: no fevers, chills, no body weight gain,  has  fatigue HEENT: no blurry vision, hearing changes or sore throat Respiratory: no dyspnea, coughing, wheezing CV: no chest pain, no palpitations GI: no nausea, vomiting, abdominal pain, diarrhea, constipation GU: no dysuria, burning on urination, increased urinary frequency, hematuria  Ext: no leg edema Neuro: no unilateral weakness, numbness, or tingling in extremities, no vision change or hearing loss.  Has left facial droop and difficulty speaking Skin: no rash, no skin tear. MSK: No muscle spasm, no deformity, no limitation of range of movement in spin Heme: No easy bruising.  Travel history: No recent long distant travel.  Allergy:  Allergies  Allergen Reactions  . Penicillins     [place]    Past Medical History:  Diagnosis Date  . Allergy   . BCE (basal cell epithelioma)   . HIV infection ([place])   . HTN (hypertension)   . Obesity     Past Surgical History:  Procedure Laterality Date  . Cataract surgery Bilateral   . LEFT HEART CATH AND CORONARY ANGIOGRAPHY N/A 06/13/2017   Procedure: LEFT HEART CATH AND CORONARY ANGIOGRAPHY;  Surgeon: Minna Merritts, MD;  Location: [facility];  Service: Cardiovascular;  Laterality: N/A;    Social History:  reports that he has never smoked. He has never used smokeless tobacco. He reports current alcohol use. He reports that he does not use drugs.  Family History:  Family History  Problem Relation Age of Onset  . CAD Mother        a. MI at age 44  . CAD Maternal Grandmother   . CAD  Paternal Grandmother      Prior to Admission medications   Medication Sig Start Date End Date Taking? Authorizing Provider  ALPRAZolam Duanne Moron) 0.5 MG tablet Take 0.5 mg 2 (two) times daily by mouth. 03/09/17   [provider]  aspirin 81 MG chewable tablet Chew 1 tablet (81 mg total) daily by mouth. 06/14/17   Max Sane, MD  atorvastatin (LIPITOR) 40 MG tablet Take 0.5 tablets (20 mg total) daily by mouth. 06/13/17   Max Sane, MD   bictegravir-emtricitabine-tenofovir AF (BIKTARVY) 50-200-25 MG TABS tablet Take 1 tablet by mouth daily. 10/21/18   Golden Circle, FNP  buPROPion (WELLBUTRIN SR) 150 MG 12 hr tablet Take 150 mg daily by mouth. 05/25/17   [provider]  citalopram (CELEXA) 40 MG tablet Take 40 mg daily by mouth. 05/25/17   [provider]  fluconazole (DIFLUCAN) 200 MG tablet  08/31/18   [provider]  lisinopril-hydrochlorothiazide (PRINZIDE,ZESTORETIC) 20-12.5 MG tablet Take 2 tablets daily by mouth. 05/25/17   [provider]  metoprolol succinate (TOPROL-XL) 100 MG 24 hr tablet Take 1 tablet daily by mouth. 03/14/17   [provider]  omeprazole (PRILOSEC) 20 MG capsule  10/17/18   [provider]    Physical Exam: Vitals:   12/24/20 1122 12/24/20 1123 12/24/20 1305 12/24/20 1335  BP:  98/75 111/75 93/63  Pulse:  78 69 64  Resp:  20 19 14   Temp:  97.9 F (36.6 C)    TempSrc:  Oral    SpO2:  97% 100% 100%  Weight: 106.6 kg     Height: 5\' 10"  (1.778 m)      General: Not in acute distress HEENT:       Eyes: PERRL, EOMI, no scleral icterus.       ENT: No discharge from the ears and nose, no pharynx injection, no tonsillar enlargement.        Neck: No JVD, no bruit, no mass felt. Heme: No neck lymph node enlargement. Cardiac: S1/S2, RRR, No murmurs, No gallops or rubs. Respiratory: No rales, wheezing, rhonchi or rubs. GI: Soft, nondistended, nontender, no rebound pain, no organomegaly, BS present. GU: No hematuria Ext: No pitting leg edema bilaterally. 1+DP/PT pulse bilaterally. Musculoskeletal: No joint deformities, No joint redness or warmth, no limitation of ROM in spin. Skin: No rashes.  Neuro: Alert, oriented X3, cranial nerves II-XII grossly intact except for left facial droop. Muscle strength 5/5 in all extremities, sensation to light touch intact. Psych: Patient is not psychotic, no suicidal or hemocidal ideation.  Labs on  Admission: I have personally reviewed following labs and imaging studies  CBC: Recent Labs  Lab 12/24/20 1124  WBC 9.1  NEUTROABS 6.4  HGB 14.9  HCT 43.1  MCV 81.0  PLT [id number]   Basic Metabolic Panel: Recent Labs  Lab 12/24/20 1124  NA 133*  K 3.8  CL 103  CO2 20*  GLUCOSE 109*  BUN 28*  CREATININE 1.49*  CALCIUM 8.8*   GFR: Estimated Creatinine Clearance: 62.8 mL/min (A) (by C-G formula based on SCr of 1.49 mg/dL (H)). Liver Function Tests: Recent Labs  Lab 12/24/20 1124  AST 20  ALT 23  ALKPHOS 116  BILITOT 0.5  PROT 7.4  ALBUMIN 3.7   No results for input(s): LIPASE, AMYLASE in the last 168 hours. No results for input(s): AMMONIA in the last 168 hours. Coagulation Profile: Recent Labs  Lab 12/24/20 1124  INR 1.0   Cardiac Enzymes: No results for input(s): CKTOTAL,  CKMB, CKMBINDEX, TROPONINI in the last 168 hours. BNP (last 3 results) No results for input(s): PROBNP in the last 8760 hours. HbA1C: No results for input(s): HGBA1C in the last 72 hours. CBG: No results for input(s): GLUCAP in the last 168 hours. Lipid Profile: No results for input(s): CHOL, HDL, LDLCALC, TRIG, CHOLHDL, LDLDIRECT in the last 72 hours. Thyroid Function Tests: No results for input(s): TSH, T4TOTAL, FREET4, T3FREE, THYROIDAB in the last 72 hours. Anemia Panel: No results for input(s): VITAMINB12, FOLATE, FERRITIN, TIBC, IRON, RETICCTPCT in the last 72 hours. Urine analysis:    Component Value Date/Time   COLORURINE YELLOW 09/10/2018 1319   APPEARANCEUR CLEAR 09/10/2018 1319   LABSPEC 1.022 09/10/2018 1319   PHURINE < OR = 5.0 09/10/2018 1319   GLUCOSEU NEGATIVE 09/10/2018 1319   HGBUR NEGATIVE 09/10/2018 1319   KETONESUR NEGATIVE 09/10/2018 1319   PROTEINUR NEGATIVE 09/10/2018 1319   NITRITE NEGATIVE 09/10/2018 1319   LEUKOCYTESUR NEGATIVE 09/10/2018 1319   Sepsis Labs: @LABRCNTIP (procalcitonin:4,lacticidven:4) ) Recent Results (from the past 240 hour(s))  Resp  Panel by RT-PCR (Flu A&B, Covid) Nasopharyngeal Swab     Status: None   Collection Time: 12/24/20  1:19 PM   Specimen: Nasopharyngeal Swab; Nasopharyngeal(NP) swabs in vial transport medium  Result Value Ref Range Status   SARS Coronavirus 2 by RT PCR NEGATIVE NEGATIVE Final    Comment: (NOTE) SARS-CoV-2 target nucleic acids are NOT DETECTED.  The SARS-CoV-2 RNA is generally detectable in upper respiratory specimens during the acute phase of infection. The lowest concentration of SARS-CoV-2 viral copies this assay can detect is 138 copies/mL. A negative result does not preclude SARS-Cov-2 infection and should not be used as the sole basis for treatment or other patient management decisions. A negative result may occur with  improper specimen collection/handling, submission of specimen other than nasopharyngeal swab, presence of viral mutation(s) within the areas targeted by this assay, and inadequate number of viral copies(<138 copies/mL). A negative result must be combined with clinical observations, patient history, and epidemiological information. The expected result is Negative.  Fact Sheet for Patients:  EntrepreneurPulse.com.au  Fact Sheet for Healthcare Providers:  IncredibleEmployment.be  This test is no t yet approved or cleared by the [place] FDA and  has been authorized for detection and/or diagnosis of SARS-CoV-2 by FDA under an Emergency Use Authorization (EUA). This EUA will remain  in effect (meaning this test can be used) for the duration of the COVID-19 declaration under Section 564(b)(1) of the Act, 21 U.S.C.section 360bbb-3(b)(1), unless the authorization is terminated  or revoked sooner.       Influenza A by PCR NEGATIVE NEGATIVE Final   Influenza B by PCR NEGATIVE NEGATIVE Final    Comment: (NOTE) The Xpert Xpress SARS-CoV-2/FLU/RSV plus assay is intended as an aid in the diagnosis of influenza from Nasopharyngeal  swab specimens and should not be used as a sole basis for treatment. Nasal washings and aspirates are unacceptable for Xpert Xpress SARS-CoV-2/FLU/RSV testing.  Fact Sheet for Patients: EntrepreneurPulse.com.au  Fact Sheet for Healthcare Providers: IncredibleEmployment.be  This test is not yet approved or cleared by the [place] FDA and has been authorized for detection and/or diagnosis of SARS-CoV-2 by FDA under an Emergency Use Authorization (EUA). This EUA will remain in effect (meaning this test can be used) for the duration of the COVID-19 declaration under Section 564(b)(1) of the Act, 21 U.S.C. section 360bbb-3(b)(1), unless the authorization is terminated or revoked.  Performed at [facility], [address] [place], [place] [address]      Radiological Exams on Admission: CT HEAD WO CONTRAST  Result Date: 12/24/2020 CLINICAL DATA:  Facial weakness which began about a week ago. Left-sided facial droop. Some speech disturbance. EXAM: CT HEAD WITHOUT CONTRAST TECHNIQUE: Contiguous axial images were obtained from the base of the skull through the vertex without intravenous contrast. COMPARISON:  03/14/2017 FINDINGS: Brain: Brain parenchyma shows mild age related volume loss. No evidence of old or acute focal infarction. No intra-axial mass lesion, hemorrhage, hydrocephalus or extra-axial collection. There is an intraventricular mass in the right lateral ventricle measuring 8 mm in diameter. This was present in 2018 and is either the same or very minimally larger. The differential diagnosis is intraventricular meningioma versus is sub ependymoma. Neither should be significant given the absence of enlargement. Consider MRI of the brain with and without contrast for further characterization. Vascular: No abnormal vascular finding. Skull: Normal Sinuses/Orbits: Clear/normal Other: None IMPRESSION: No evidence stroke by CT. 8 mm right  sided intraventricular or subependymal mass of the right lateral ventricle, very similar to the study of August 2018. This argues against any progressive or aggressive diagnosis. 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 Consider MRI with and without contrast for further evaluation. Electronically Signed   By: Nelson Chimes M.D.   On: 12/24/2020 12:49   MR BRAIN W WO CONTRAST  Result Date: 12/24/2020 CLINICAL DATA:  Left facial droop and difficulty speaking. Brain mass. EXAM: MRI HEAD WITHOUT AND WITH CONTRAST TECHNIQUE: Multiplanar, multiecho pulse sequences of the brain and surrounding structures were obtained without and with intravenous contrast. CONTRAST:  34mL GADAVIST GADOBUTROL 1 MMOL/ML IV SOLN COMPARISON:  Head CT 12/24/2020 and 03/14/2017 FINDINGS: Brain: No acute infarct, intracranial hemorrhage, midline shift, or extra-axial fluid collection is identified. The brain itself is normal in signal. Mild cerebral atrophy is within normal limits for age. There is a 9 mm T2 and FLAIR hyperintense, nonenhancing mass in the body of the right lateral ventricle which is unchanged in size from 2018 and is not attached to the septum pellucidum. Vascular: Major intracranial vascular flow voids are preserved. Skull and upper cervical spine: Unremarkable bone marrow signal. Sinuses/Orbits: Bilateral cataract extraction. Small mucous retention cysts in the right maxillary and right sphenoid sinuses. Small right mastoid effusion. Other: None. IMPRESSION: 1. No acute intracranial abnormality. 2. 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 Electronically Signed   By: Logan Bores M.D.   On: 12/24/2020 15:09     EKG: I have personally reviewed.  Sinus rhythm, QTC 443, early R wave progression, nonspecific to change   Assessment/Plan Principal Problem:   Facial droop Active Problems:   Hypertension   HIV disease  ([place])   HLD (hyperlipidemia)   GERD (gastroesophageal reflux disease)   Depression with anxiety   CKD (chronic kidney disease), stage IIIa   Brain mass   CAD (coronary artery disease)   Facial droop-left: Patient has left facial droop, difficulty speaking.  Differential diagnosis include stroke and Bell's palsy.  MRI of her brain is negative for stroke. Dr. Cheral Marker of neurology is consulted.   - Placed on MedSurg bed for observation - ASA and lipitor - fasting lipid panel and HbA1c  - swallowing screen. If fails, will get SLP - Check UDS  - PT/OT consult  Hypertension -IV hydralazine as needed -Amlodipine, metoprolol -Hold Prinzide since blood pressure is soft  HIV disease ([place]) -Continue home medications  HLD (hyperlipidemia):  Patient's not taking Lipitor currently -Restarted Lipitor  GERD (gastroesophageal reflux disease) -Protonix  Depression with anxiety -Continue home medications  CKD (chronic kidney disease), stage IIIa: Stable. 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 -May give referral to neurosurgeon to follow-up    DVT ppx: SQ Lovenox Code Status: Full code Family Communication:  Yes, patient's husband at bed side Disposition Plan:  Anticipate discharge back to previous environment Consults called:  Dr. Cheral Marker of neuro Admission status and Level of care: Med-Surg:   for obs  Status is: Observation  The patient remains OBS appropriate and will d/c before 2 midnights.  Dispo: The patient is from: Home              Anticipated d/c is to: Home              Patient currently is not medically stable to d/c.   Difficult to place patient No         Date of Service 12/24/2020    Ivor Costa [facility]   If 7PM-7AM, please contact night-coverage [email or web address] 12/24/2020, 4:09  PM

## 2020-12-24 NOTE — ED Notes (Signed)
Pt c/o left sided facial droop and blurry vision x1 wk that has gotten worse with episode of difficult speech today. Pt is AOX4, ambulatory, NAD noted. Left facial droop noted. Upper and lower extremities strong bilaterally. Pt is hard of hearing.

## 2020-12-24 NOTE — Consult Note (Signed)
NEURO HOSPITALIST CONSULT NOTE   Requestig physician: Dr. Blaine Hamper  Reason for Consult: Left facial droop  History obtained from:   Patient and Chart    HPI:                                                                                                                                          Christopher Hart is an 60 y.o. male with a PMHx of brain mass, HIV, HTN, HLD, CAD, CKD-IIIa, multiple skin melanoma resections, depression with anxiety and GERD who presented to the hospital this morning for evaluation of what he felt was a possible stroke occurring about one week ago. He states that a week ago last Thursday he noticed left sided facial weakness including inability to smile or form his mouth around a glass or bottle to drink, as well as difficulty fully closing his left eye. He did endorse difficulty with word finding. He denies any changes in taste or recent abrupt change in hearing acuity. No sensory numbness to left side of face. No double vision, headache or vision loss. No limb weakness or numbness.   He denies left ear pain or ear drainage. He has had chronically declining hearing acuity but is not able to outline exactly for how long.   Past Medical History:  Diagnosis Date  . Allergy   . BCE (basal cell epithelioma)   . HIV infection ([place])   . HTN (hypertension)   . Obesity     Past Surgical History:  Procedure Laterality Date  . Cataract surgery Bilateral   . LEFT HEART CATH AND CORONARY ANGIOGRAPHY N/A 06/13/2017   Procedure: LEFT HEART CATH AND CORONARY ANGIOGRAPHY;  Surgeon: Minna Merritts, MD;  Location: [facility];  Service: Cardiovascular;  Laterality: N/A;    Family History  Problem Relation Age of Onset  . CAD Mother        a. MI at age 49  . CAD Maternal Grandmother   . CAD Paternal Grandmother               Social History:  reports that he has never smoked. He has never used smokeless tobacco. He reports current alcohol use. He  reports that he does not use drugs.  Allergies  Allergen Reactions  . Penicillins     [place]    MEDICATIONS:  Prior to Admission:  Medications Prior to Admission  Medication Sig Dispense Refill Last Dose  . ALPRAZolam (XANAX) 1 MG tablet Take 1 mg by mouth 2 (two) times daily.   12/24/2020 at Unknown time  . amLODipine (NORVASC) 5 MG tablet Take 1 tablet by mouth daily.   12/24/2020 at Unknown time  . aspirin 81 MG chewable tablet Chew 1 tablet (81 mg total) daily by mouth. 30 tablet 0 12/24/2020 at Unknown time  . buPROPion (WELLBUTRIN SR) 150 MG 12 hr tablet Take 150 mg daily by mouth.  3 12/24/2020 at Unknown time  . citalopram (CELEXA) 40 MG tablet Take 40 mg daily by mouth.  3 12/24/2020 at Unknown time  . efavirenz-emtricitabine-tenofovir (ATRIPLA) 600-200-300 MG tablet Take 1 tablet by mouth at bedtime.   12/23/2020 at Unknown time  . ezetimibe (ZETIA) 10 MG tablet Take 10 mg by mouth daily.   12/24/2020 at Unknown time  . ibuprofen (ADVIL) 800 MG tablet Take 800 mg by mouth 3 (three) times daily.   12/24/2020 at Unknown time  . lisinopril-hydrochlorothiazide (PRINZIDE,ZESTORETIC) 20-12.5 MG tablet Take 2 tablets daily by mouth.  3 12/24/2020 at Unknown time  . metoprolol tartrate (LOPRESSOR) 50 MG tablet Take 50 mg by mouth 2 (two) times daily.   12/24/2020 at 0900  . omeprazole (PRILOSEC) 20 MG capsule Take 20 mg by mouth daily.   12/24/2020 at Unknown time  . TRINTELLIX 20 MG TABS tablet Take 20 mg by mouth daily.   12/24/2020 at Unknown time  . atorvastatin (LIPITOR) 40 MG tablet Take 0.5 tablets (20 mg total) daily by mouth. (Patient not taking: Reported on 12/24/2020) 30 tablet 0 Not Taking at Unknown time  . bictegravir-emtricitabine-tenofovir AF (BIKTARVY) 50-200-25 MG TABS tablet Take 1 tablet by mouth daily. (Patient not taking: Reported on 12/24/2020) 30 tablet 2 Not Taking  at Unknown time   Scheduled: . atorvastatin  40 mg Oral Daily     ROS:                                                                                                                                       No CP, cough, SOB, fever, chills, N/V or abdominal pain. Other ROS as per HPI.  Blood pressure 94/72, pulse 67, temperature 97.8 F (36.6 C), temperature source Oral, resp. rate 16, height 5\' 10"  (1.778 m), weight 106.6 kg, SpO2 100 %.  General Examination:  Physical Exam  HEENT: [place]/AT. No scleral injection Lungs: Respirations unlabored.  Ext: No edema  Neurological Examination Mental Status: Awake and alert. Oriented x 5. Pleasant and cooperative. Speech fluent with intact comprehension and naming. No neglect.  Cranial Nerves: II: Temporal visual fields intact with no extinction to DSS. PERRL.  III,IV, VI: Left palpebral fissure is smaller than on the right. EOMI without nystagmus or diplopia.  V: Temp sensation equal in V1-3 distributions bilaterally.  VII: Left lower quadrant of face with moderate to severe weakness. Left eyelid closure is weak. Brow furrowing on the left is decreased relative to the right.  VIII: Hyperacusis to left ear as compared with the right (tested with pen-click) IX,X: No hypophonia XI: Symmetric shoulder shrug XII: Midline tongue extension Motor: Right : Upper extremity   5/5    Left:     Upper extremity   5/5  Lower extremity   5/5     Lower extremity   5/5 No pronator drift.  Sensory: Temp and light touch intact throughout, bilaterally. No extinction to DSS.  Deep Tendon Reflexes: 2+ and symmetric throughout Cerebellar: No ataxia with FNF bilaterally  Gait: Deferred   Lab Results: Basic Metabolic Panel: Recent Labs  Lab 12/24/20 1124  NA 133*  K 3.8  CL 103  CO2 20*  GLUCOSE 109*  BUN 28*  CREATININE 1.49*  CALCIUM 8.8*     CBC: Recent Labs  Lab 12/24/20 1124  WBC 9.1  NEUTROABS 6.4  HGB 14.9  HCT 43.1  MCV 81.0  PLT 363    Cardiac Enzymes: No results for input(s): CKTOTAL, CKMB, CKMBINDEX, TROPONINI in the last 168 hours.  Lipid Panel: No results for input(s): CHOL, TRIG, HDL, CHOLHDL, VLDL, LDLCALC in the last 168 hours.  Imaging: CT HEAD WO CONTRAST  Result Date: 12/24/2020 CLINICAL DATA:  Facial weakness which began about a week ago. Left-sided facial droop. Some speech disturbance. EXAM: CT HEAD WITHOUT CONTRAST TECHNIQUE: Contiguous axial images were obtained from the base of the skull through the vertex without intravenous contrast. COMPARISON:  03/14/2017 FINDINGS: Brain: Brain parenchyma shows mild age related volume loss. No evidence of old or acute focal infarction. No intra-axial mass lesion, hemorrhage, hydrocephalus or extra-axial collection. There is an intraventricular mass in the right lateral ventricle measuring 8 mm in diameter. This was present in 2018 and is either the same or very minimally larger. The differential diagnosis is intraventricular meningioma versus is sub ependymoma. Neither should be significant given the absence of enlargement. Consider MRI of the brain with and without contrast for further characterization. Vascular: No abnormal vascular finding. Skull: Normal Sinuses/Orbits: Clear/normal Other: None IMPRESSION: No evidence stroke by CT. 8 mm right sided intraventricular or subependymal mass of the right lateral ventricle, very similar to the study of August 2018. This argues against any progressive or aggressive diagnosis. 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 Consider MRI with and without contrast for further evaluation. Electronically Signed   By: Nelson Chimes M.D.   On: 12/24/2020 12:49   MR BRAIN W WO CONTRAST  Result Date: 12/24/2020 CLINICAL DATA:  Left facial droop and difficulty speaking. Brain mass. EXAM:  MRI HEAD WITHOUT AND WITH CONTRAST TECHNIQUE: Multiplanar, multiecho pulse sequences of the brain and surrounding structures were obtained without and with intravenous contrast. CONTRAST:  70mL GADAVIST GADOBUTROL 1 MMOL/ML IV SOLN COMPARISON:  Head CT 12/24/2020 and 03/14/2017 FINDINGS: Brain: No acute infarct, intracranial hemorrhage, midline shift, or extra-axial fluid collection is identified.  The brain itself is normal in signal. Mild cerebral atrophy is within normal limits for age. There is a 9 mm T2 and FLAIR hyperintense, nonenhancing mass in the body of the right lateral ventricle which is unchanged in size from 2018 and is not attached to the septum pellucidum. Vascular: Major intracranial vascular flow voids are preserved. Skull and upper cervical spine: Unremarkable bone marrow signal. Sinuses/Orbits: Bilateral cataract extraction. Small mucous retention cysts in the right maxillary and right sphenoid sinuses. Small right mastoid effusion. Other: None. IMPRESSION: 1. No acute intracranial abnormality. 2. 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 Electronically Signed   By: Logan Bores M.D.   On: 12/24/2020 15:09    Assessment: 60 year old male presenting with left sided facial droop.   1. Exam reveals findings most consistent with left sided Bell's palsy. MRI has ruled out brainstem stroke.   2. CT head reveals an 8 mm right sided intraventricular or subependymal mass of the right lateral ventricle, very similar to the study of August 2018. This argues against any progressive or aggressive diagnosis. 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 3. MRI brain: No acute intracranial abnormality. 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   Recommendations: 1. Although his symptoms began 8 days ago, he may still benefit from steroids and antiviral therapy  for his Bell's palsy.  2. Recommend prednisone followed by a taper: Start with 60 mg po qd x 6 days, then decrease by 10 mg per day (50, 40, 30, 20, 10, then stop).  3. Recommend valacyclovir 1 g po TID x 7 days 4. Outpatient Neurology follow up.  5. The subependymoma should be re-imaged in 1 year's time to assess for stability.  6. Discussed recommendations with the patient who expressed understanding and agreement.   Electronically signed: Dr. Kerney Elbe 12/24/2020, 5:09 PM

## 2020-12-24 NOTE — ED Provider Notes (Signed)
[facility] Emergency Department Provider Note   ____________________________________________    I have reviewed the triage vital signs and the nursing notes.   HISTORY  Chief Complaint Possible stroke    HPI Christopher Hart is a 60 y.o. male with history as noted below who presents with reports of left facial droop and difficulty speaking.  Patient reports yesterday morning he woke up and noted the left side of his face was drooping and he had difficulty speaking, he reports this improved somewhat throughout the day however this morning when he woke up his droop seemed worse and when he drank water it came out the left side of his mouth.  No difficulty speaking today.  No head injury.  No nausea or vomiting.  No extremity weakness.  Past Medical History:  Diagnosis Date  . Allergy   . BCE (basal cell epithelioma)   . HIV infection ([place])   . HTN (hypertension)   . Obesity     Patient Active Problem List   Diagnosis Date Noted  . Stroke ([place]) 12/24/2020  . HLD (hyperlipidemia) 12/24/2020  . GERD (gastroesophageal reflux disease) 12/24/2020  . Depression with anxiety 12/24/2020  . CKD (chronic kidney disease), stage IIIa 12/24/2020  . Brain mass 12/24/2020  . CAD (coronary artery disease) 12/24/2020  . Healthcare maintenance 10/21/2018  . NSTEMI (non-ST elevated myocardial infarction) ([place]) 06/12/2017  . Hypertension 04/01/2012  . HIV disease ([place]) 04/01/2012  . Allergic rhinitis, mild 04/01/2012  . Obesity (BMI 35.0-39.9 without comorbidity) 04/01/2012    Past Surgical History:  Procedure Laterality Date  . Cataract surgery Bilateral   . LEFT HEART CATH AND CORONARY ANGIOGRAPHY N/A 06/13/2017   Procedure: LEFT HEART CATH AND CORONARY ANGIOGRAPHY;  Surgeon: Minna Merritts, MD;  Location: [facility];  Service: Cardiovascular;  Laterality: N/A;    Prior to Admission medications   Medication Sig Start Date End Date Taking?  Authorizing Provider  ALPRAZolam Duanne Moron) 0.5 MG tablet Take 0.5 mg 2 (two) times daily by mouth. 03/09/17   [provider]  aspirin 81 MG chewable tablet Chew 1 tablet (81 mg total) daily by mouth. 06/14/17   Max Sane, MD  atorvastatin (LIPITOR) 40 MG tablet Take 0.5 tablets (20 mg total) daily by mouth. 06/13/17   Max Sane, MD  bictegravir-emtricitabine-tenofovir AF (BIKTARVY) 50-200-25 MG TABS tablet Take 1 tablet by mouth daily. 10/21/18   [place] Circle, FNP  buPROPion (WELLBUTRIN SR) 150 MG 12 hr tablet Take 150 mg daily by mouth. 05/25/17   [provider]  citalopram (CELEXA) 40 MG tablet Take 40 mg daily by mouth. 05/25/17   [provider]  fluconazole (DIFLUCAN) 200 MG tablet  08/31/18   [provider]  lisinopril-hydrochlorothiazide (PRINZIDE,ZESTORETIC) 20-12.5 MG tablet Take 2 tablets daily by mouth. 05/25/17   [provider]  metoprolol succinate (TOPROL-XL) 100 MG 24 hr tablet Take 1 tablet daily by mouth. 03/14/17   [provider]  omeprazole (PRILOSEC) 20 MG capsule  10/17/18   [provider]     Allergies Penicillins  Family History  Problem Relation Age of Onset  . CAD Mother        a. MI at age 12  . CAD Maternal Grandmother   . CAD Paternal Grandmother     Social History Social History   Tobacco Use  . Smoking status: Never Smoker  . Smokeless tobacco: Never Used  Vaping Use  . Vaping Use: Never used  Substance Use Topics  . Alcohol use: Yes    Comment: rare  . Drug use: No    Review of Systems  Constitutional: No fever/chills Eyes: No visual changes.  ENT: No sore throat. Cardiovascular: Denies chest pain. Respiratory: Denies shortness of breath. Gastrointestinal: No abdominal pain.   Genitourinary: Negative for dysuria. Musculoskeletal: Negative for back pain. Skin: Negative for rash. Neurological: As above   ____________________________________________   PHYSICAL  EXAM:  VITAL SIGNS: ED Triage Vitals  Enc Vitals Group     BP 12/24/20 1123 98/75     Pulse Rate 12/24/20 1123 78     Resp 12/24/20 1123 20     Temp 12/24/20 1123 97.9 F (36.6 C)     Temp Source 12/24/20 1123 Oral     SpO2 12/24/20 1123 97 %     Weight 12/24/20 1122 106.6 kg (235 lb)     Height 12/24/20 1122 1.778 m (5\' 10" )     Head Circumference --      Peak Flow --      Pain Score 12/24/20 1122 5     Pain Loc --      Pain Edu? --      Excl. in [place]? --     Constitutional: Alert and oriented. No acute distress. Pleasant and interactive Eyes: Conjunctivae are normal.   Nose: No congestion/rhinnorhea. Mouth/Throat: Mucous membranes are moist.    Cardiovascular: Normal rate, regular rhythm. Grossly normal heart sounds.  Good peripheral circulation. Respiratory: Normal respiratory effort.  No retractions. Lungs CTAB. Gastrointestinal: Soft and nontender. No distention.  No CVA tenderness. Genitourinary: deferred Musculoskeletal: No lower extremity tenderness nor edema.  Warm and well perfused Neurologic: Normal speech and language, left-sided facial droop includes forehead, normal strength in the upper and lower extremities. Skin:  Skin is warm, dry and intact. No rash noted. Psychiatric: Mood and affect are normal. Speech and behavior are normal.  ____________________________________________   LABS (all labs ordered are listed, but only abnormal results are displayed)  Labs Reviewed  APTT - Abnormal; Notable for the following components:      Result Value   aPTT 37 (*)    All other components within normal limits  COMPREHENSIVE METABOLIC PANEL - Abnormal; Notable for the following components:   Sodium 133 (*)    CO2 20 (*)    Glucose, Bld 109 (*)    BUN 28 (*)    Creatinine, Ser 1.49 (*)    Calcium 8.8 (*)    GFR, Estimated 53 (*)    All other components within normal limits  RESP PANEL BY RT-PCR (FLU A&B, COVID) ARPGX2  PROTIME-INR  CBC  DIFFERENTIAL  CBG  MONITORING, ED   ____________________________________________  EKG  ED ECG REPORT I, Lavonia Drafts, the attending physician, personally viewed and interpreted this ECG.  Date: 12/24/2020  Rhythm: normal sinus rhythm QRS Axis: normal Intervals: normal ST/T Wave abnormalities: normal Narrative Interpretation: no evidence of acute ischemia  ____________________________________________  RADIOLOGY  CT head without evidence of acute stroke ____________________________________________   PROCEDURES  Procedure(s) performed: No  Procedures   Critical Care performed: No ____________________________________________   INITIAL IMPRESSION / ASSESSMENT AND PLAN / ED COURSE  Pertinent labs & imaging results that were available during my care of the patient were reviewed by me and considered in my medical decision making (see chart for details).  Patient presents with left-sided facial droop as noted above as well as difficulty speaking, now improved.  Concerning for CVA.  Lab work  overall unremarkable, CT head without evidence of acute stroke.  On exam patient does have left-sided facial droop involving the forehead.  No other neurodeficits at this time.  I discussed with the hospitalist for admission for further evaluation.  Aspirin given    ____________________________________________   FINAL CLINICAL IMPRESSION(S) / ED DIAGNOSES  Final diagnoses:  Cerebrovascular accident (CVA), unspecified mechanism ([place])        Note:  This document was prepared using Dragon voice recognition software and may include unintentional dictation errors.   Lavonia Drafts, MD 12/24/20 [phone]

## 2020-12-24 NOTE — ED Notes (Signed)
EDP notified chewable ASA given.

## 2020-12-24 NOTE — ED Triage Notes (Signed)
Pt to ED via POV with c/o poss stroke approx 1 week ago. Pt states unable to smile, unable to form his mouth around a glass/bottle to drink. Pt with noted L sided facial droop. Pt also c/o difficulty finding his words.   Pt states LKW 5/9, states discovered symptoms on 5/12.

## 2020-12-24 NOTE — ED Notes (Signed)
Transportation requested  

## 2020-12-25 DIAGNOSIS — N1831 Chronic kidney disease, stage 3a: Secondary | ICD-10-CM | POA: Diagnosis not present

## 2020-12-25 LAB — CBC
HCT: 40.4 % (ref 39.0–52.0)
Hemoglobin: 13.9 g/dL (ref 13.0–17.0)
MCH: 28.3 pg (ref 26.0–34.0)
MCHC: 34.4 g/dL (ref 30.0–36.0)
MCV: 82.3 fL (ref 80.0–100.0)
Platelets: 344 10*3/uL (ref 150–400)
RBC: 4.91 MIL/uL (ref 4.22–5.81)
RDW: 12.5 % (ref 11.5–15.5)
WBC: 9.6 10*3/uL (ref 4.0–10.5)
nRBC: 0 % (ref 0.0–0.2)

## 2020-12-25 LAB — BASIC METABOLIC PANEL
Anion gap: 11 (ref 5–15)
BUN: 24 mg/dL — ABNORMAL HIGH (ref 8–23)
CO2: 22 mmol/L (ref 22–32)
Calcium: 8.7 mg/dL — ABNORMAL LOW (ref 8.9–10.3)
Chloride: 101 mmol/L (ref 98–111)
Creatinine, Ser: 1.52 mg/dL — ABNORMAL HIGH (ref 0.61–1.24)
GFR, Estimated: 51 mL/min — ABNORMAL LOW (ref >60)
Glucose, Bld: 117 mg/dL — ABNORMAL HIGH (ref 70–99)
Potassium: 3.4 mmol/L — ABNORMAL LOW (ref 3.5–5.1)
Sodium: 134 mmol/L — ABNORMAL LOW (ref 135–145)

## 2020-12-25 MED ORDER — VALACYCLOVIR HCL 1 G PO TABS: 1000.0000 mg | ORAL_TABLET | Freq: Three times a day (TID) | ORAL | 0 refills | Status: AC

## 2020-12-25 MED ORDER — AMLODIPINE BESYLATE 5 MG PO TABS
5.0000 mg | ORAL_TABLET | Freq: Every day | ORAL | Status: DC
  Filled 2020-12-25: qty 1

## 2020-12-25 MED ORDER — EFAVIRENZ-EMTRICITAB-TENOFOVIR 600-200-300 MG PO TABS
1.0000 | ORAL_TABLET | Freq: Every day | ORAL | Status: DC
  Filled 2020-12-25: qty 1

## 2020-12-25 MED ORDER — ACETAMINOPHEN 325 MG PO TABS: 650.0000 mg | ORAL_TABLET | ORAL | Status: DC | PRN

## 2020-12-25 MED ORDER — STROKE: EARLY STAGES OF RECOVERY BOOK: Freq: Once | Status: DC

## 2020-12-25 MED ORDER — PANTOPRAZOLE SODIUM 40 MG PO TBEC
40.0000 mg | DELAYED_RELEASE_TABLET | Freq: Every day | ORAL | Status: DC
  Administered 2020-12-25: 09:00:00 40 mg via ORAL
  Filled 2020-12-25: qty 1

## 2020-12-25 MED ORDER — ALPRAZOLAM 1 MG PO TABS
1.0000 mg | ORAL_TABLET | Freq: Two times a day (BID) | ORAL | Status: DC
  Administered 2020-12-25: 1 mg via ORAL
  Filled 2020-12-25: qty 1

## 2020-12-25 MED ORDER — ACETAMINOPHEN 650 MG RE SUPP: 650.0000 mg | RECTAL | Status: DC | PRN

## 2020-12-25 MED ORDER — VALACYCLOVIR HCL 500 MG PO TABS
1000.0000 mg | ORAL_TABLET | Freq: Three times a day (TID) | ORAL | Status: DC
  Administered 2020-12-25: 1000 mg via ORAL
  Filled 2020-12-25 (×3): qty 2

## 2020-12-25 MED ORDER — SENNOSIDES-DOCUSATE SODIUM 8.6-50 MG PO TABS
1.0000 | ORAL_TABLET | Freq: Every evening | ORAL | Status: DC | PRN
  Administered 2020-12-25: 09:00:00 1 via ORAL
  Filled 2020-12-25: qty 1

## 2020-12-25 MED ORDER — EZETIMIBE 10 MG PO TABS
10.0000 mg | ORAL_TABLET | Freq: Every day | ORAL | Status: DC
  Administered 2020-12-25: 09:00:00 10 mg via ORAL
  Filled 2020-12-25: qty 1

## 2020-12-25 MED ORDER — CITALOPRAM HYDROBROMIDE 20 MG PO TABS
40.0000 mg | ORAL_TABLET | Freq: Every day | ORAL | Status: DC
  Administered 2020-12-25: 09:00:00 40 mg via ORAL
  Filled 2020-12-25: qty 2

## 2020-12-25 MED ORDER — ACETAMINOPHEN 160 MG/5ML PO SOLN
650.0000 mg | ORAL | Status: DC | PRN
  Filled 2020-12-25: qty 20.3

## 2020-12-25 MED ORDER — PREDNISONE 50 MG PO TABS
60.0000 mg | ORAL_TABLET | Freq: Every day | ORAL | Status: DC
  Administered 2020-12-25: 60 mg via ORAL
  Filled 2020-12-25: qty 1

## 2020-12-25 MED ORDER — ENOXAPARIN SODIUM 60 MG/0.6ML IJ SOSY
0.5000 mg/kg | PREFILLED_SYRINGE | INTRAMUSCULAR | Status: DC
  Administered 2020-12-25: 52.5 mg via SUBCUTANEOUS
  Filled 2020-12-25: qty 0.6

## 2020-12-25 MED ORDER — VORTIOXETINE HBR 5 MG PO TABS
20.0000 mg | ORAL_TABLET | Freq: Every day | ORAL | Status: DC
  Administered 2020-12-25: 09:00:00 20 mg via ORAL
  Filled 2020-12-25: qty 4

## 2020-12-25 MED ORDER — ASPIRIN 325 MG PO TABS
325.0000 mg | ORAL_TABLET | Freq: Every day | ORAL | Status: DC
  Administered 2020-12-25: 325 mg via ORAL
  Filled 2020-12-25: qty 1

## 2020-12-25 MED ORDER — PREDNISONE 50 MG PO TABS: 60.0000 mg | ORAL_TABLET | Freq: Every day | ORAL | Status: DC

## 2020-12-25 MED ORDER — METOPROLOL TARTRATE 50 MG PO TABS
50.0000 mg | ORAL_TABLET | Freq: Two times a day (BID) | ORAL | Status: DC
  Filled 2020-12-25: qty 1

## 2020-12-25 MED ORDER — PREDNISONE 20 MG PO TABS: ORAL_TABLET | ORAL | 0 refills | Status: DC

## 2020-12-25 MED ORDER — ASPIRIN 300 MG RE SUPP
300.0000 mg | Freq: Every day | RECTAL | Status: DC
  Filled 2020-12-25: qty 1

## 2020-12-25 MED ORDER — BUPROPION HCL ER (SR) 150 MG PO TB12
150.0000 mg | ORAL_TABLET | Freq: Every day | ORAL | Status: DC
  Administered 2020-12-25: 150 mg via ORAL
  Filled 2020-12-25: qty 1

## 2020-12-25 NOTE — Discharge Summary (Signed)
Physician Discharge Summary  Christopher Hart [id number] DOB: [birth year] DOA: 12/24/2020  PCP: Nicoletta Dress, MD  Admit date: 12/24/2020 Discharge date: 12/25/2020  Admitted From: Home  Disposition:  Home   Recommendations for Outpatient Follow-up:  1. Follow up with PCP in 1-2 weeks 2. Please obtain BMP/CBC in one week 3. Taper prednisone, Vatrex 4. Monitor BP 5. Needs to follow up with neurologist   Home Health: None  Discharge Condition: Stable.  CODE STATUS: Full code Diet recommendation: Heart Healthy   Brief/Interim Summary: 60 year old with past medical history significant for brain mass, HIV CD4 1699, hypertension, hyperlipidemia, CAD, CKD stage IIIa, depression, anxiety who presents with left facial droop and difficulty speaking since 6 days prior to admission.  Evaluation in the ED: COVID PCR negative,  CT head; 8 mm right side intraventricular or subependymal mass of the right lateral ventricle, very similar to the study of August 2018.  This argues against any progressive or aggressive diagnosis.  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   1-left side Bell's palsy; He was evaluated by neurologist and was thought to have Bell's palsy.  MRI was negative for acute stroke. She was a started on prednisone taper (60 grams for 6 days then decrease by 10 mg daily on. )  And Valtrex  2-Hypertension: His blood pressure has been soft we held his amlodipine and metoprolol.  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      Discharge Diagnoses:   Principal Problem:   Facial droop Active Problems:   Hypertension   HIV disease ([place])   HLD (hyperlipidemia)   GERD (gastroesophageal reflux disease)   Depression with anxiety   CKD (chronic kidney disease), stage IIIa   Brain mass   CAD (coronary artery disease)    Discharge Instructions  Discharge Instructions    Diet - low sodium heart healthy   Complete by: As directed    Increase activity slowly   Complete by: As directed      Allergies as of 12/25/2020      Reactions   Penicillins    Whelps      Medication List    STOP taking these medications   amLODipine 5 MG tablet Commonly known as: NORVASC   bictegravir-emtricitabine-tenofovir AF 50-200-25 MG Tabs tablet Commonly known as: BIKTARVY   ibuprofen 800 MG tablet Commonly known as: ADVIL   lisinopril-hydrochlorothiazide 20-12.5 MG tablet Commonly known as: ZESTORETIC   metoprolol tartrate 50 MG tablet Commonly known as: LOPRESSOR     TAKE these medications   ALPRAZolam 1 MG tablet Commonly known as: XANAX Take 1 mg by mouth 2 (two) times daily.   aspirin 81 MG chewable tablet Chew 1 tablet (81 mg total) daily by mouth.   atorvastatin 40 MG tablet Commonly known as: LIPITOR Take 0.5 tablets (20 mg total) daily by mouth.   buPROPion 150 MG 12 hr tablet Commonly known as: WELLBUTRIN SR Take 150 mg daily by mouth.   citalopram 40 MG tablet Commonly known as: CELEXA Take 40 mg daily by mouth.   efavirenz-emtricitabine-tenofovir 600-200-300 MG tablet Commonly known as: ATRIPLA Take  1 tablet by mouth at bedtime.   ezetimibe 10 MG tablet Commonly known as: ZETIA Take 10 mg by mouth daily.   omeprazole 20 MG capsule Commonly known as: PRILOSEC Take 20 mg by mouth daily.   predniSONE 20 MG tablet Commonly known as: DELTASONE Take 60 mg daily for 6 days, then take 50 mg for one day, then 40 mg for one day, then 30 mg for one day, then 20 mg for one day then 10 mg for one day, then stop.    Trintellix 20 MG Tabs tablet Generic drug: vortioxetine HBr Take 20 mg by mouth daily.   valACYclovir 1000 MG tablet Commonly known as: VALTREX Take 1 tablet (1,000 mg total) by mouth 3 (three) times daily for 7 days.       Follow-up Information    Nicoletta Dress, MD Follow up in 1 week(s).   Specialty: Internal Medicine Contact information: [place] [address] [phone]        Sharl Ma, MD Follow up in 1 week(s).   Specialty: Neurology Contact information: [address] [place] [address] [phone]              Allergies  Allergen Reactions  . Penicillins     Whelps    Consultations:  Neurology    Procedures/Studies: CT HEAD WO CONTRAST  Result Date: 12/24/2020 CLINICAL DATA:  Facial weakness which began about a week ago. Left-sided facial droop. Some speech disturbance. EXAM: CT HEAD WITHOUT CONTRAST TECHNIQUE: Contiguous axial images were obtained from the base of the skull through the vertex without intravenous contrast. COMPARISON:  03/14/2017 FINDINGS: Brain: Brain parenchyma shows mild age related volume loss. No evidence of old or acute focal infarction. No intra-axial mass lesion, hemorrhage, hydrocephalus or extra-axial collection. There is an intraventricular mass in the right lateral ventricle measuring 8 mm in diameter. This was present in 2018 and is either the same or very minimally larger. The differential diagnosis is intraventricular meningioma versus is sub ependymoma. Neither should be significant given the absence of enlargement. Consider MRI of the brain with and without contrast for further characterization. Vascular: No abnormal vascular finding. Skull: Normal Sinuses/Orbits: Clear/normal Other: None IMPRESSION: No evidence stroke by CT. 8 mm right sided intraventricular or subependymal mass of the right lateral ventricle, very similar to the study of August 2018. This argues against  any progressive or aggressive diagnosis. 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 Consider MRI with and without contrast for further evaluation. Electronically Signed   By: Nelson Chimes M.D.   On: 12/24/2020 12:49   MR BRAIN W WO CONTRAST  Result Date: 12/24/2020 CLINICAL DATA:  Left facial droop and difficulty speaking. Brain mass. EXAM: MRI HEAD WITHOUT AND WITH CONTRAST TECHNIQUE: Multiplanar, multiecho pulse sequences of the brain and surrounding structures were obtained without and with intravenous contrast. CONTRAST:  46mL GADAVIST GADOBUTROL 1 MMOL/ML IV SOLN COMPARISON:  Head CT 12/24/2020 and 03/14/2017 FINDINGS: Brain: No acute infarct, intracranial hemorrhage, midline shift, or extra-axial fluid collection is identified. The brain itself is normal in signal. Mild cerebral atrophy is within normal limits for age. There is a 9 mm T2 and FLAIR hyperintense, nonenhancing mass in the body of the right lateral ventricle which is unchanged in size from 2018 and is not attached to the septum pellucidum. Vascular: Major intracranial vascular flow voids are preserved. Skull and upper cervical spine: Unremarkable bone marrow signal. Sinuses/Orbits: Bilateral cataract  extraction. Small mucous retention cysts in the right maxillary and right sphenoid sinuses. Small right mastoid effusion. Other: None. IMPRESSION: 1. No acute intracranial abnormality. 2. 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 Electronically Signed   By: Logan Bores M.D.   On: 12/24/2020 15:09     Subjective: Left facial drop stable.   Discharge Exam: Vitals:   12/25/20 0547 12/25/20 0826  BP: 106/61 109/66  Pulse: 74 77  Resp: 16 18  Temp: 98 F (36.7 C) 98.4 F (36.9 C)  SpO2: 97% 100%     General: Pt is alert, awake, not in acute distress, left facial drop.  Cardiovascular: RRR, S1/S2 +, no rubs, no gallops Respiratory: CTA  bilaterally, no wheezing, no rhonchi Abdominal: Soft, NT, ND, bowel sounds + Extremities: no edema, no cyanosis    The results of significant diagnostics from this hospitalization (including imaging, microbiology, ancillary and laboratory) are listed below for reference.     Microbiology: Recent Results (from the past 240 hour(s))  Resp Panel by RT-PCR (Flu A&B, Covid) Nasopharyngeal Swab     Status: None   Collection Time: 12/24/20  1:19 PM   Specimen: Nasopharyngeal Swab; Nasopharyngeal(NP) swabs in vial transport medium  Result Value Ref Range Status   SARS Coronavirus 2 by RT PCR NEGATIVE NEGATIVE Final    Comment: (NOTE) SARS-CoV-2 target nucleic acids are NOT DETECTED.  The SARS-CoV-2 RNA is generally detectable in upper respiratory specimens during the acute phase of infection. The lowest concentration of SARS-CoV-2 viral copies this assay can detect is 138 copies/mL. A negative result does not preclude SARS-Cov-2 infection and should not be used as the sole basis for treatment or other patient management decisions. A negative result may occur with  improper specimen collection/handling, submission of specimen other than nasopharyngeal swab, presence of viral mutation(s) within the areas targeted by this assay, and inadequate number of viral copies(<138 copies/mL). A negative result must be combined with clinical observations, patient history, and epidemiological information. The expected result is Negative.  Fact Sheet for Patients:  EntrepreneurPulse.com.au  Fact Sheet for Healthcare Providers:  IncredibleEmployment.be  This test is no t yet approved or cleared by the [place] FDA and  has been authorized for detection and/or diagnosis of SARS-CoV-2 by FDA under an Emergency Use Authorization (EUA). This EUA will remain  in effect (meaning this test can be used) for the duration of the COVID-19 declaration under Section  564(b)(1) of the Act, 21 U.S.C.section 360bbb-3(b)(1), unless the authorization is terminated  or revoked sooner.       Influenza A by PCR NEGATIVE NEGATIVE Final   Influenza B by PCR NEGATIVE NEGATIVE Final    Comment: (NOTE) The Xpert Xpress SARS-CoV-2/FLU/RSV plus assay is intended as an aid in the diagnosis of influenza from Nasopharyngeal swab specimens and should not be used as a sole basis for treatment. Nasal washings and aspirates are unacceptable for Xpert Xpress SARS-CoV-2/FLU/RSV testing.  Fact Sheet for Patients: EntrepreneurPulse.com.au  Fact Sheet for Healthcare Providers: IncredibleEmployment.be  This test is not yet approved or cleared by the [place] FDA and has been authorized for detection and/or diagnosis of SARS-CoV-2 by FDA under an Emergency Use Authorization (EUA). This EUA will remain in effect (meaning this test can be used) for the duration of the COVID-19 declaration under Section 564(b)(1) of the Act, 21 U.S.C. section 360bbb-3(b)(1), unless the authorization is terminated or revoked.  Performed at [facility], [address] [place],  [place] [address]      Labs: BNP (last 3 results) No results for input(s): BNP in the last 8760 hours. Basic Metabolic Panel: Recent Labs  Lab 12/24/20 1124 12/25/20 0726  NA 133* 134*  K 3.8 3.4*  CL 103 101  CO2 20* 22  GLUCOSE 109* 117*  BUN 28* 24*  CREATININE 1.49* 1.52*  CALCIUM 8.8* 8.7*   Liver Function Tests: Recent Labs  Lab 12/24/20 1124  AST 20  ALT 23  ALKPHOS 116  BILITOT 0.5  PROT 7.4  ALBUMIN 3.7   No results for input(s): LIPASE, AMYLASE in the last 168 hours. No results for input(s): AMMONIA in the last 168 hours. CBC: Recent Labs  Lab 12/24/20 1124 12/25/20 0726  WBC 9.1 9.6  NEUTROABS 6.4  --   HGB 14.9 13.9  HCT 43.1 40.4  MCV 81.0 82.3  PLT 363 344   Cardiac Enzymes: No results for input(s): CKTOTAL, CKMB,  CKMBINDEX, TROPONINI in the last 168 hours. BNP: Invalid input(s): POCBNP CBG: No results for input(s): GLUCAP in the last 168 hours. D-Dimer No results for input(s): DDIMER in the last 72 hours. Hgb A1c No results for input(s): HGBA1C in the last 72 hours. Lipid Profile No results for input(s): CHOL, HDL, LDLCALC, TRIG, CHOLHDL, LDLDIRECT in the last 72 hours. Thyroid function studies No results for input(s): TSH, T4TOTAL, T3FREE, THYROIDAB in the last 72 hours.  Invalid input(s): FREET3 Anemia work up No results for input(s): VITAMINB12, FOLATE, FERRITIN, TIBC, IRON, RETICCTPCT in the last 72 hours. Urinalysis    Component Value Date/Time   COLORURINE YELLOW 09/10/2018 1319   APPEARANCEUR CLEAR 09/10/2018 1319   LABSPEC 1.022 09/10/2018 1319   PHURINE < OR = 5.0 09/10/2018 1319   GLUCOSEU NEGATIVE 09/10/2018 1319   HGBUR NEGATIVE 09/10/2018 1319   KETONESUR NEGATIVE 09/10/2018 1319   PROTEINUR NEGATIVE 09/10/2018 1319   NITRITE NEGATIVE 09/10/2018 1319   LEUKOCYTESUR NEGATIVE 09/10/2018 1319   Sepsis Labs Invalid input(s): PROCALCITONIN,  WBC,  LACTICIDVEN Microbiology Recent Results (from the past 240 hour(s))  Resp Panel by RT-PCR (Flu A&B, Covid) Nasopharyngeal Swab     Status: None   Collection Time: 12/24/20  1:19 PM   Specimen: Nasopharyngeal Swab; Nasopharyngeal(NP) swabs in vial transport medium  Result Value Ref Range Status   SARS Coronavirus 2 by RT PCR NEGATIVE NEGATIVE Final    Comment: (NOTE) SARS-CoV-2 target nucleic acids are NOT DETECTED.  The SARS-CoV-2 RNA is generally detectable in upper respiratory specimens during the acute phase of infection. The lowest concentration of SARS-CoV-2 viral copies this assay can detect is 138 copies/mL. A negative result does not preclude SARS-Cov-2 infection and should not be used as the sole basis for treatment or other patient management decisions. A negative result may occur with  improper specimen  collection/handling, submission of specimen other than nasopharyngeal swab, presence of viral mutation(s) within the areas targeted by this assay, and inadequate number of viral copies(<138 copies/mL). A negative result must be combined with clinical observations, patient history, and epidemiological information. The expected result is Negative.  Fact Sheet for Patients:  EntrepreneurPulse.com.au  Fact Sheet for Healthcare Providers:  IncredibleEmployment.be  This test is no t yet approved or cleared by the [place] FDA and  has been authorized for detection and/or diagnosis of SARS-CoV-2 by FDA under an Emergency Use Authorization (EUA). This EUA will remain  in effect (meaning this test can be used) for the duration of the COVID-19 declaration under Section 564(b)(1) of the  Act, 21 U.S.C.section 360bbb-3(b)(1), unless the authorization is terminated  or revoked sooner.       Influenza A by PCR NEGATIVE NEGATIVE Final   Influenza B by PCR NEGATIVE NEGATIVE Final    Comment: (NOTE) The Xpert Xpress SARS-CoV-2/FLU/RSV plus assay is intended as an aid in the diagnosis of influenza from Nasopharyngeal swab specimens and should not be used as a sole basis for treatment. Nasal washings and aspirates are unacceptable for Xpert Xpress SARS-CoV-2/FLU/RSV testing.  Fact Sheet for Patients: EntrepreneurPulse.com.au  Fact Sheet for Healthcare Providers: IncredibleEmployment.be  This test is not yet approved or cleared by the [place] FDA and has been authorized for detection and/or diagnosis of SARS-CoV-2 by FDA under an Emergency Use Authorization (EUA). This EUA will remain in effect (meaning this test can be used) for the duration of the COVID-19 declaration under Section 564(b)(1) of the Act, 21 U.S.C. section 360bbb-3(b)(1), unless the authorization is terminated or revoked.  Performed at [facility], [address] [place], [place] [address]      Time coordinating discharge: 40 minutes  SIGNED:   Elmarie Shiley, MD  [facility]

## 2020-12-25 NOTE — Progress Notes (Signed)
OT Cancellation Note  Patient Details Name: Christopher Hart MRN: [id number] DOB: 11-14-1960   Cancelled Treatment:    Reason Eval/Treat Not Completed: OT screened, no needs identified, will sign off. Consult received, chart reviewed. Pt pleasant and eager to return home. Pt denies difficulty with ADL tasks, does not demonstrate deficits. No skilled OT services indicated. Will sign off.   Hanley Hays, MPH, MS, OTR/L ascom [phone] 12/25/20, 9:59 AM

## 2020-12-25 NOTE — Discharge Instructions (Signed)
 Bell's Palsy, Adult  Bell's palsy is a short-term inability to move muscles in a part of the face. The inability to move, also called paralysis, results from inflammation or compression of the seventh cranial nerve. This nerve travels along the skull and under the ear to the side of the face. This nerve is responsible for facial movements that include blinking, closing the eyes, smiling, and frowning. What are the causes? The exact cause of this condition is not known. It may be caused by an infection from a virus, such as the chickenpox (herpes zoster), Epstein-Barr, or mumps virus. What increases the risk? You are more likely to develop this condition if:  You are pregnant.  You have diabetes.  You have had a recent infection in your nose, throat, or airways.  You have a weakened body defense system (immune system).  You have had a facial injury, such as a fracture.  You have a family history of Bell's palsy. What are the signs or symptoms? Symptoms of this condition include:  Weakness on one side of the face.  Drooping eyelid and corner of the mouth.  Excessive tearing in one eye.  Difficulty closing the eyelid.  Dry eye.  Drooling.  Dry mouth.  Changes in taste.  Change in facial appearance.  Pain behind one ear.  Ringing in one or both ears.  Sensitivity to sound in one ear.  Facial twitching.  Headache.  Impaired speech.  Dizziness.  Difficulty eating or drinking. Most of the time, only one side of the face is affected. In rare cases, Bell's palsy may affect the whole face. How is this diagnosed? This condition is diagnosed based on:  Your symptoms.  Your medical history.  A physical exam. You may also have to see health care providers who specialize in disorders of the nerves (neurologist) or diseases and conditions of the eye (ophthalmologist). You may have tests, such as:  A test to check for nerve damage (electromyogram).  Imaging  studies, such as a CT scan or an MRI.  Blood tests. How is this treated? This condition affects every person differently. Sometimes symptoms go away without treatment within a couple weeks. If treatment is needed, it varies from person to person. The goal of treatment is to reduce inflammation and protect the eye from damage. Treatment for Bell's palsy may include:  Medicines, such as: ? Steroids to reduce swelling and inflammation. ? Antiviral medicines. ? Pain relievers, including aspirin, acetaminophen, or ibuprofen.  Eye drops or ointment to keep your eye moist.  Eye protection, if you cannot close your eye.  Exercises or massage to regain muscle strength and function (physical therapy). Follow these instructions at home:  Take over-the-counter and prescription medicines only as told by your health care provider.  If your eye is affected: ? Keep your eye moist with eye drops or ointment as told by your health care provider. ? Follow instructions for eye care and protection as told by your health care provider.  Do any physical therapy exercises as told by your health care provider.  Keep all follow-up visits. This is important.   Contact a health care provider if:  You have a fever or chills.  Your symptoms do not get better within 2-3 weeks, or your symptoms get worse.  Your eye is red, irritated, or painful.  You have new symptoms. Get help right away if:  You have weakness or numbness in a part of your body other than your face.  You   have trouble swallowing.  You develop neck pain or stiffness.  You develop dizziness or shortness of breath. Summary  Bell's palsy is a short-term inability to move muscles in a part of the face. The inability to move results from inflammation or compression of the facial nerve.  This condition affects every person differently. Sometimes symptoms go away without treatment within a couple weeks.  If treatment is needed, it varies  from person to person. The goal of treatment is to reduce inflammation and protect the eye from damage.  Contact your health care provider if your symptoms do not get better within 2-3 weeks, or your symptoms get worse. This information is not intended to replace advice given to you by your health care provider. Make sure you discuss any questions you have with your health care provider. Document Revised: 04/22/2020 Document Reviewed: 04/22/2020 Elsevier Patient Education  2021 [employer]  

## 2020-12-25 NOTE — Evaluation (Addendum)
Speech Language Pathology Evaluation Patient Details Name: Christopher Hart MRN: [id number] DOB: 09/03/1960 Today's Date: 12/25/2020 Time: 0820-0900 SLP Time Calculation (min) (ACUTE ONLY): 40 min  Problem List:  Patient Active Problem List   Diagnosis Date Noted  . HLD (hyperlipidemia) 12/24/2020  . GERD (gastroesophageal reflux disease) 12/24/2020  . Depression with anxiety 12/24/2020  . CKD (chronic kidney disease), stage IIIa 12/24/2020  . Brain mass 12/24/2020  . CAD (coronary artery disease) 12/24/2020  . Facial droop 12/24/2020  . Healthcare maintenance 10/21/2018  . NSTEMI (non-ST elevated myocardial infarction) ([place]) 06/12/2017  . Hypertension 04/01/2012  . HIV disease ([place]) 04/01/2012  . Allergic rhinitis, mild 04/01/2012  . Obesity (BMI 35.0-39.9 without comorbidity) 04/01/2012   Past Medical History:  Past Medical History:  Diagnosis Date  . Allergy   . BCE (basal cell epithelioma)   . HIV infection ([place])   . HTN (hypertension)   . Obesity    Past Surgical History:  Past Surgical History:  Procedure Laterality Date  . Cataract surgery Bilateral   . LEFT HEART CATH AND CORONARY ANGIOGRAPHY N/A 06/13/2017   Procedure: LEFT HEART CATH AND CORONARY ANGIOGRAPHY;  Surgeon: Minna Merritts, MD;  Location: [facility];  Service: Cardiovascular;  Laterality: N/A;   HPI:  Pt  is an 60 y.o. male with a PMHx of brain mass, HIV, HTN, HLD, CAD, CKD-IIIa, multiple skin melanoma resections, depression with anxiety and GERD who presented to the hospital this morning for evaluation of what he felt was a possible stroke occurring about one week ago. He states that a week ago last Thursday he noticed left sided facial weakness including inability to smile or form his mouth around a glass or bottle to drink, as well as difficulty fully closing his left eye. He denies any changes in taste or recent abrupt change in hearing acuity though loss of hearing in Left ear. No sensory  numbness to left side of face. No double vision, headache or vision loss. No limb weakness or numbness.  MRI: No acute intracranial abnormality.  Neurology has consulted and dx'd pt w/ Bell's Palsy and initiated a medication regimen.   Assessment / Plan / Recommendation Clinical Impression  Pt appears to present w/ No Expressive or Receptive Aphasia per informal assessment at Bedside today. No Cognitive deficits noted. Pt is A/Ox4 and converses in general conversation w/ no language or significant motor speech deficits noted. Pt does present w/ decreased (mild) Left labial-facial tone which slightly impacts pt's articulation/intelligibility of speech w/ certain speech sounds(new dx of Bell's Palsy). Pt's speech is fully intelligible, but when he slows his speech rate and attends to full articulation of speech sounds/word, precision of speech sounds/words increases. Pt also ate his breakfast meal during session w/ no anterior leakage or loss of liquids/foods on Left side d/t the decreased labial tone. OM exam revealed No lingual weakness; Left labial-facial decreased tone.   Per Neurology note, an acute CVA has been ruled out; the dx of Bell's Palsy given: "Exam reveals findings most consistent with Left sided Bell's palsy. MRI has ruled out brainstem stroke.". A medication regimen has been initiated.   Discussed w/ pt information on Bell's Palsy; Handout given for further reading w/ encouragement to f/u w/ PCP, or readings on medical websites. Recommend Rest and Hydration but explained No OM exercises are recommended for tx of Bell's Palsy.  Of Note, pt revealed he had been under increased Stress ~8-10 days ago - about the time when his  s/s initiated.  No further ST services indicated at this time. NSG to reconsult if any new needs during admit. Pt agreed w/ above. MD updated.    SLP Assessment  SLP Recommendation/Assessment: Patient does not need any further Speech Lanaguage Pathology Services SLP Visit  Diagnosis:  (Bell's Palsy)    Follow Up Recommendations  None    Frequency and Duration  (n/a)   (n/a)      SLP Evaluation Cognition  Overall Cognitive Status: Within Functional Limits for tasks assessed Arousal/Alertness: Awake/alert Orientation Level: Oriented X4 Attention: Focused;Sustained Focused Attention: Appears intact Sustained Attention: Appears intact Memory: Appears intact Awareness: Appears intact Problem Solving: Appears intact Safety/Judgment: Appears intact       Comprehension  Auditory Comprehension Overall Auditory Comprehension: Appears within functional limits for tasks assessed Yes/No Questions: Within Functional Limits Commands: Within Functional Limits Conversation: Complex Visual Recognition/Discrimination Discrimination: Not tested Reading Comprehension Reading Status: Not tested    Expression Expression Primary Mode of Expression: Verbal Verbal Expression Overall Verbal Expression: Appears within functional limits for tasks assessed Initiation: No impairment Level of Generative/Spontaneous Verbalization: Conversation Naming: No impairment Pragmatics: No impairment Interfering Components: Speech intelligibility (slight) Non-Verbal Means of Communication: Not applicable   Oral / Motor  Oral Motor/Sensory Function Overall Oral Motor/Sensory Function: Mild impairment Facial ROM: Reduced left Facial Symmetry: Abnormal symmetry left Facial Strength: Reduced left Facial Sensation: Within Functional Limits Lingual ROM: Within Functional Limits Lingual Symmetry: Within Functional Limits Lingual Strength: Within Functional Limits Mandible: Within Functional Limits Motor Speech Overall Motor Speech: Appears within functional limits for tasks assessed Respiration: Within functional limits Phonation: Normal Resonance: Within functional limits Articulation: Impaired (slight -- d/t left labial decreased tone) Intelligibility: Intelligible Motor  Planning: Witnin functional limits Motor Speech Errors: Not applicable Effective Techniques: Over-articulate (min)   GO                      Orinda Kenner, MS, CCC-SLP Speech Language Pathologist Rehab Services [phone] [facility] 12/25/2020, 1:24 PM

## 2020-12-25 NOTE — Progress Notes (Signed)
PT Cancellation Note  Patient Details Name: Christopher Hart MRN: [id number] DOB: 01/13/1961   Cancelled Treatment:    Reason Eval/Treat Not Completed: PT screened, no needs identified, will sign off. Patient says he has no trouble getting around. Hoping to go home today.    [name] 12/25/2020, 10:33 AM

## 2020-12-25 NOTE — Progress Notes (Signed)
PHARMACIST - PHYSICIAN COMMUNICATION  CONCERNING:  Enoxaparin (Lovenox) for DVT Prophylaxis    RECOMMENDATION: Patient was prescribed enoxaprin 40mg  q24 hours for VTE prophylaxis.   Filed Weights   12/24/20 1122  Weight: 106.6 kg (235 lb)    Body mass index is 33.72 kg/m.  Estimated Creatinine Clearance: 62.8 mL/min (A) (by C-G formula based on SCr of 1.49 mg/dL (H)).   Based on [place] patient is candidate for enoxaparin 0.5mg /kg TBW SQ every 24 hours based on BMI being >30.  DESCRIPTION: Pharmacy has adjusted enoxaparin dose per [facility] policy.  Patient is now receiving enoxaparin 0.5 mg/kg every 24 hours    Renda Rolls, PharmD, [facility] 12/25/2020 6:44 AM

## 2020-12-31 DIAGNOSIS — Z8673 Personal history of transient ischemic attack (TIA), and cerebral infarction without residual deficits: Secondary | ICD-10-CM | POA: Diagnosis not present

## 2020-12-31 DIAGNOSIS — G510 Bell's palsy: Secondary | ICD-10-CM | POA: Diagnosis not present

## 2020-12-31 DIAGNOSIS — E785 Hyperlipidemia, unspecified: Secondary | ICD-10-CM | POA: Diagnosis not present

## 2020-12-31 DIAGNOSIS — I10 Essential (primary) hypertension: Secondary | ICD-10-CM | POA: Diagnosis not present

## 2021-01-09 ENCOUNTER — Encounter: Payer: Self-pay | Admitting: Radiology

## 2021-01-09 ENCOUNTER — Emergency Department: Payer: BC Managed Care – PPO

## 2021-01-09 ENCOUNTER — Emergency Department
Admission: EM | Admit: 2021-01-09 | Discharge: 2021-01-09 | Disposition: A | Discharge status: Home / Self Care | Payer: BC Managed Care – PPO | Attending: Emergency Medicine | Admitting: Emergency Medicine

## 2021-01-09 ENCOUNTER — Other Ambulatory Visit: Payer: Self-pay

## 2021-01-09 DIAGNOSIS — I129 Hypertensive chronic kidney disease with stage 1 through stage 4 chronic kidney disease, or unspecified chronic kidney disease: Secondary | ICD-10-CM | POA: Insufficient documentation

## 2021-01-09 DIAGNOSIS — Z20822 Contact with and (suspected) exposure to covid-19: Secondary | ICD-10-CM | POA: Diagnosis not present

## 2021-01-09 DIAGNOSIS — R112 Nausea with vomiting, unspecified: Secondary | ICD-10-CM | POA: Insufficient documentation

## 2021-01-09 DIAGNOSIS — R531 Weakness: Secondary | ICD-10-CM | POA: Diagnosis not present

## 2021-01-09 DIAGNOSIS — R059 Cough, unspecified: Secondary | ICD-10-CM | POA: Diagnosis not present

## 2021-01-09 DIAGNOSIS — N1831 Chronic kidney disease, stage 3a: Secondary | ICD-10-CM | POA: Diagnosis not present

## 2021-01-09 DIAGNOSIS — Z7982 Long term (current) use of aspirin: Secondary | ICD-10-CM | POA: Diagnosis not present

## 2021-01-09 DIAGNOSIS — R6883 Chills (without fever): Secondary | ICD-10-CM | POA: Diagnosis not present

## 2021-01-09 DIAGNOSIS — I2510 Atherosclerotic heart disease of native coronary artery without angina pectoris: Secondary | ICD-10-CM | POA: Insufficient documentation

## 2021-01-09 DIAGNOSIS — R000 Tachycardia, unspecified: Secondary | ICD-10-CM | POA: Diagnosis not present

## 2021-01-09 DIAGNOSIS — R110 Nausea: Secondary | ICD-10-CM | POA: Diagnosis not present

## 2021-01-09 DIAGNOSIS — R42 Dizziness and giddiness: Secondary | ICD-10-CM | POA: Diagnosis not present

## 2021-01-09 DIAGNOSIS — Z21 Asymptomatic human immunodeficiency virus [HIV] infection status: Secondary | ICD-10-CM | POA: Insufficient documentation

## 2021-01-09 DIAGNOSIS — R1110 Vomiting, unspecified: Secondary | ICD-10-CM | POA: Diagnosis not present

## 2021-01-09 DIAGNOSIS — R519 Headache, unspecified: Secondary | ICD-10-CM | POA: Insufficient documentation

## 2021-01-09 LAB — BASIC METABOLIC PANEL
Anion gap: 14 (ref 5–15)
BUN: 29 mg/dL — ABNORMAL HIGH (ref 8–23)
CO2: 18 mmol/L — ABNORMAL LOW (ref 22–32)
Calcium: 9 mg/dL (ref 8.9–10.3)
Chloride: 101 mmol/L (ref 98–111)
Creatinine, Ser: 1.87 mg/dL — ABNORMAL HIGH (ref 0.61–1.24)
GFR, Estimated: 40 mL/min — ABNORMAL LOW (ref >60)
Glucose, Bld: 158 mg/dL — ABNORMAL HIGH (ref 70–99)
Potassium: 3.2 mmol/L — ABNORMAL LOW (ref 3.5–5.1)
Sodium: 133 mmol/L — ABNORMAL LOW (ref 135–145)

## 2021-01-09 LAB — RESP PANEL BY RT-PCR (FLU A&B, COVID) ARPGX2
Influenza A by PCR: NEGATIVE
Influenza B by PCR: NEGATIVE
SARS Coronavirus 2 by RT PCR: NEGATIVE

## 2021-01-09 LAB — CBC
HCT: 44.7 % (ref 39.0–52.0)
Hemoglobin: 15.6 g/dL (ref 13.0–17.0)
MCH: 28 pg (ref 26.0–34.0)
MCHC: 34.9 g/dL (ref 30.0–36.0)
MCV: 80.3 fL (ref 80.0–100.0)
Platelets: 387 10*3/uL (ref 150–400)
RBC: 5.57 MIL/uL (ref 4.22–5.81)
RDW: 13.1 % (ref 11.5–15.5)
WBC: 12.8 10*3/uL — ABNORMAL HIGH (ref 4.0–10.5)
nRBC: 0 % (ref 0.0–0.2)

## 2021-01-09 LAB — TROPONIN I (HIGH SENSITIVITY): Troponin I (High Sensitivity): 9 ng/L (ref <18)

## 2021-01-09 IMAGING — DX DG CHEST 1V PORT
1 series · 1 of 1 positions shown · non-contrast
Comparison: Chest x-ray dated [DATE].

CLINICAL DATA: Cough, weakness

EXAM:
PORTABLE CHEST 1 VIEW

[chest ap]
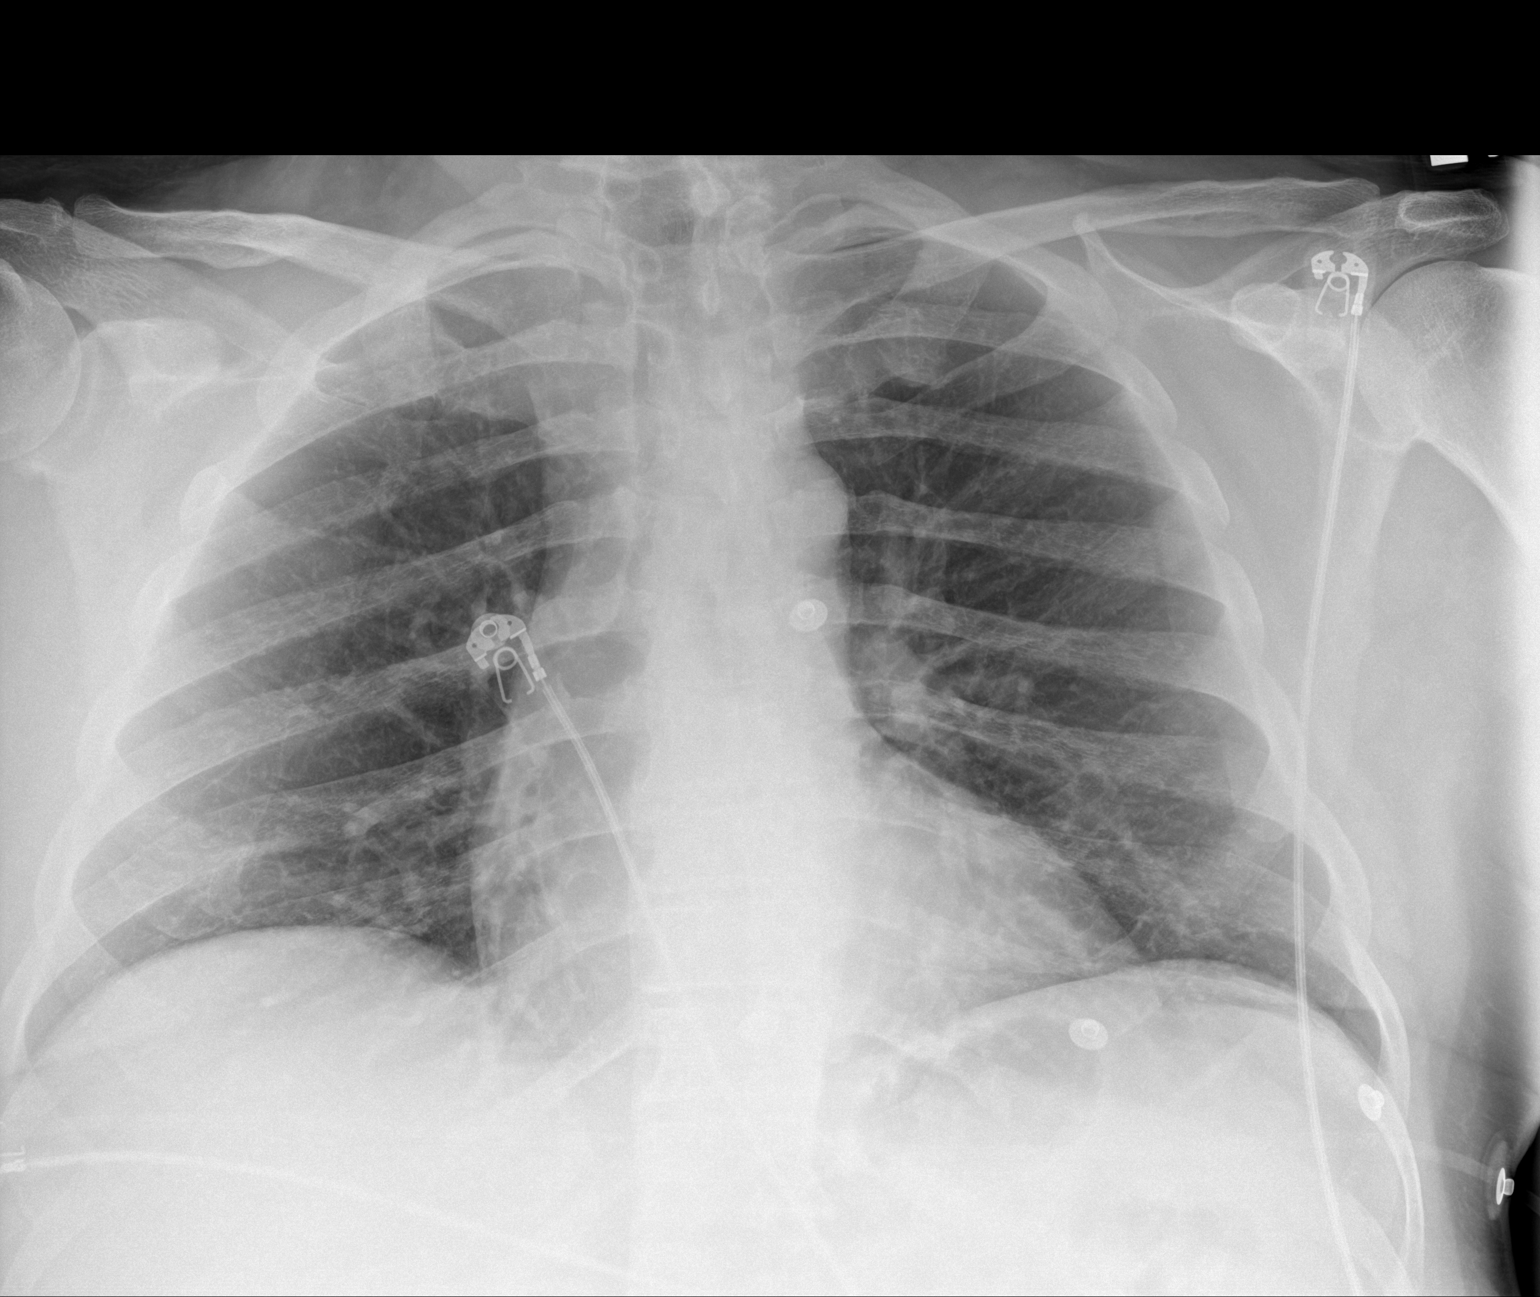

[1 of 1 positions shown; findings below may reference images not displayed]

FINDINGS: Heart size and mediastinal contours are within normal limits. Lungs
are clear. No pleural effusion or pneumothorax is seen. Osseous
structures about the chest are unremarkable.
IMPRESSION: No active disease. No evidence of pneumonia or pulmonary edema.

## 2021-01-09 IMAGING — US US ABDOMEN LIMITED
1 series · 14 of 25 positions shown · non-contrast
Comparison: None.

CLINICAL DATA: Right upper quadrant pain and vomiting.

EXAM:
ULTRASOUND ABDOMEN LIMITED RIGHT UPPER QUADRANT

[Series 1: us abdomen limited ruq (liver/gb) · 14 of 37 slices shown]
[im 1/37]
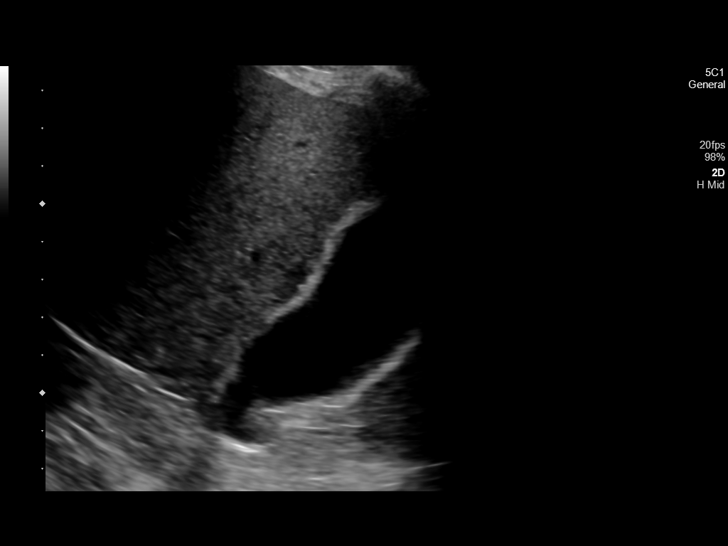
[im 4/37]
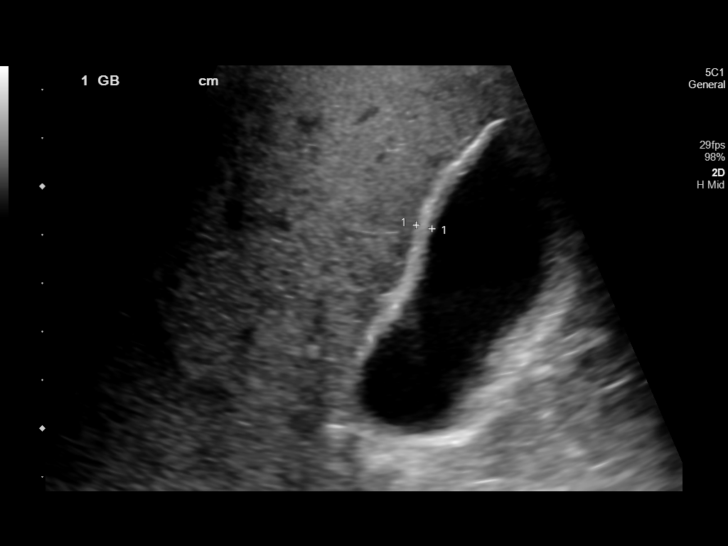
[im 7/37]
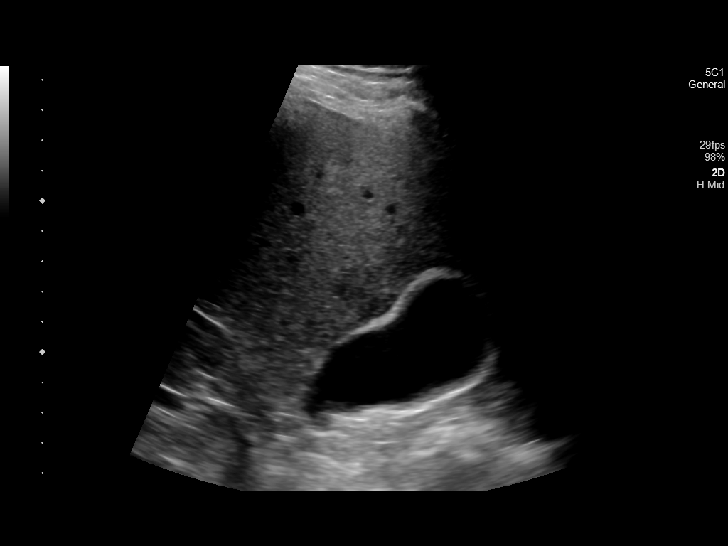
[im 10/37]
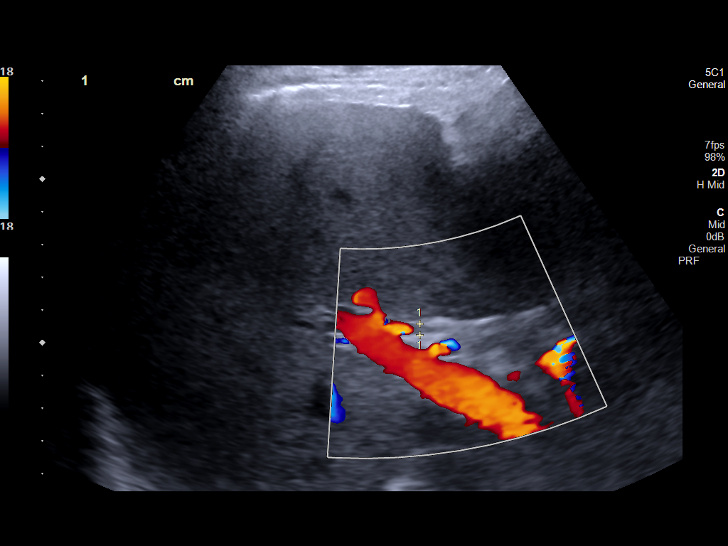
[im 13/37]
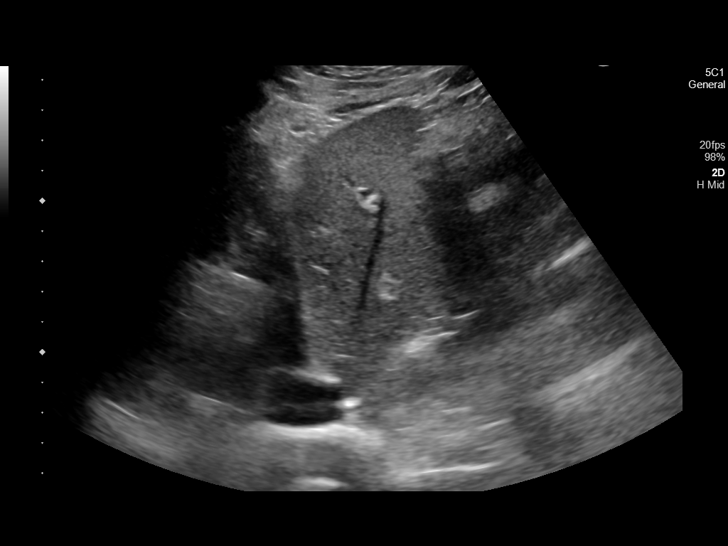
[im 14/37]
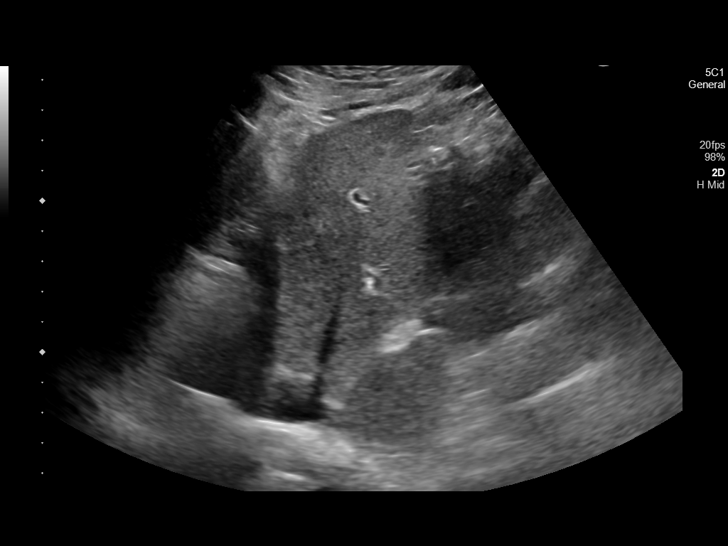
[im 17/37]
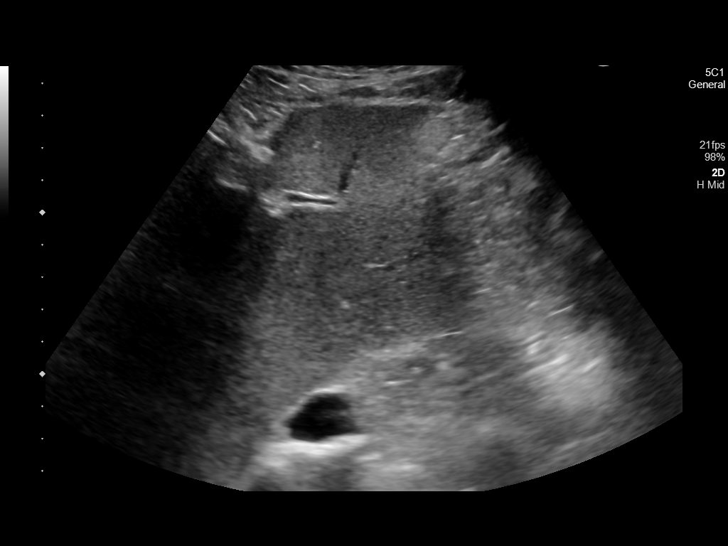
[im 20/37]
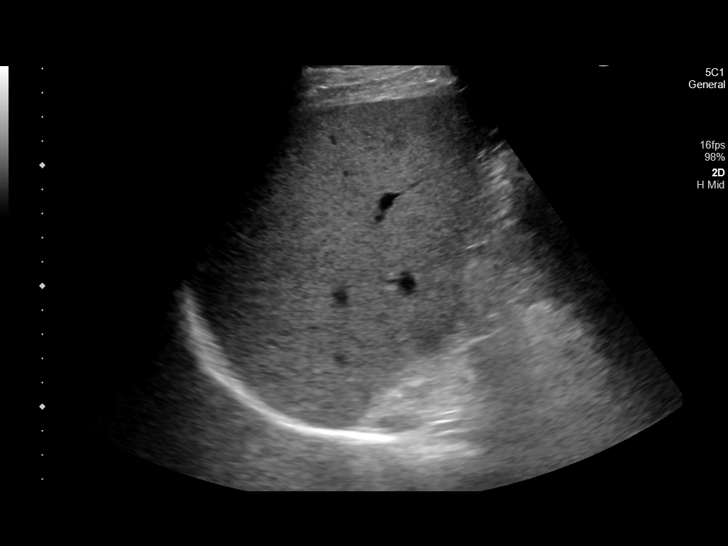
[im 23/37]
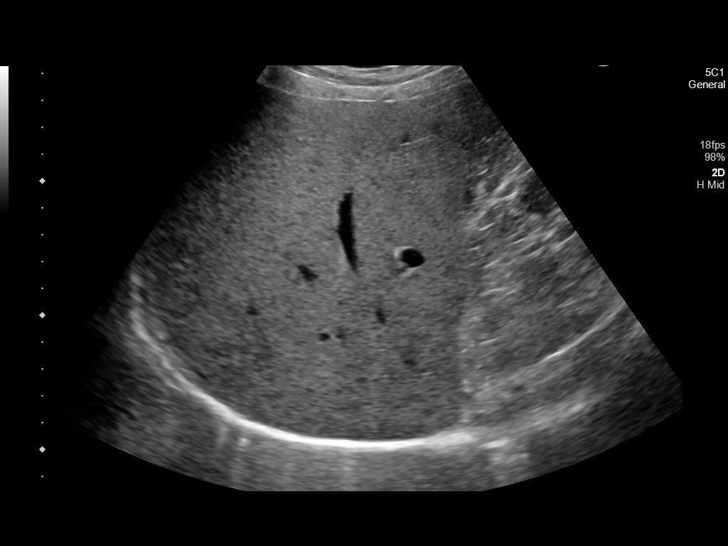
[im 25/37]
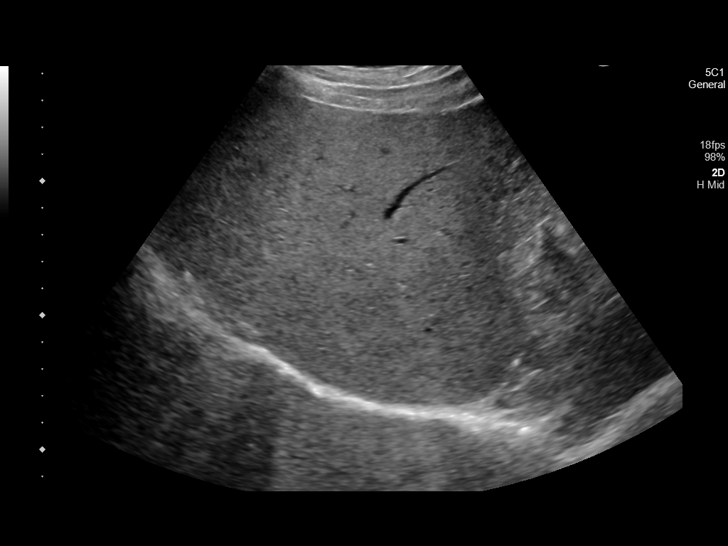
[im 28/37]
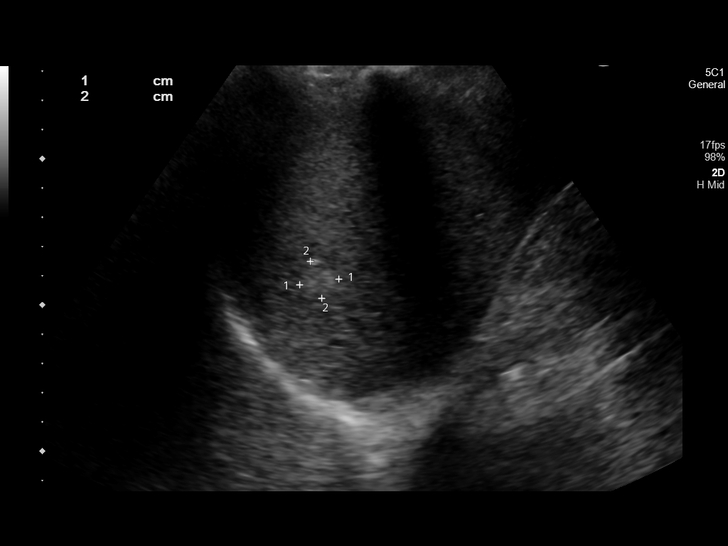
[im 31/37]
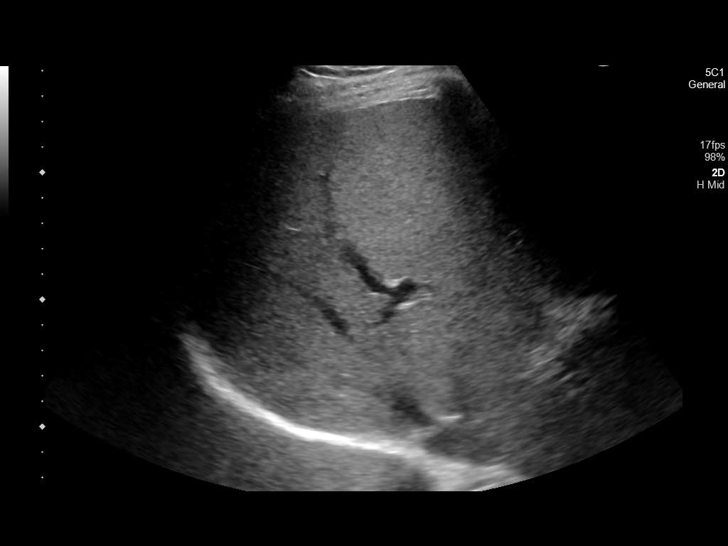
[im 34/37]
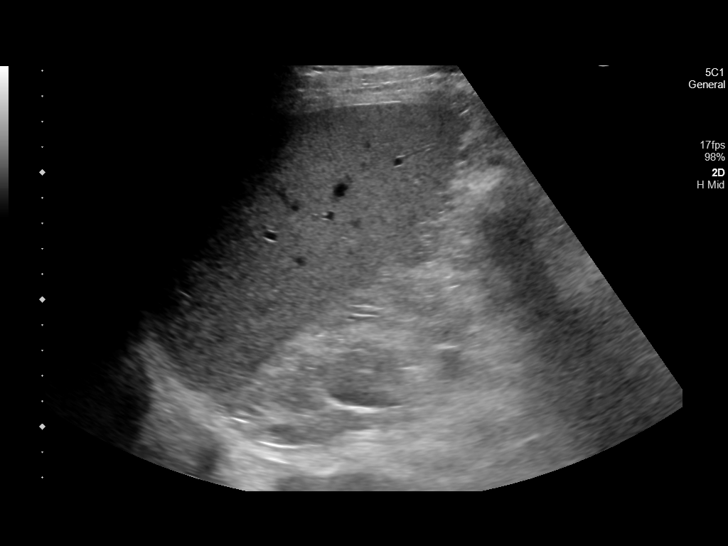
[im 37/37]
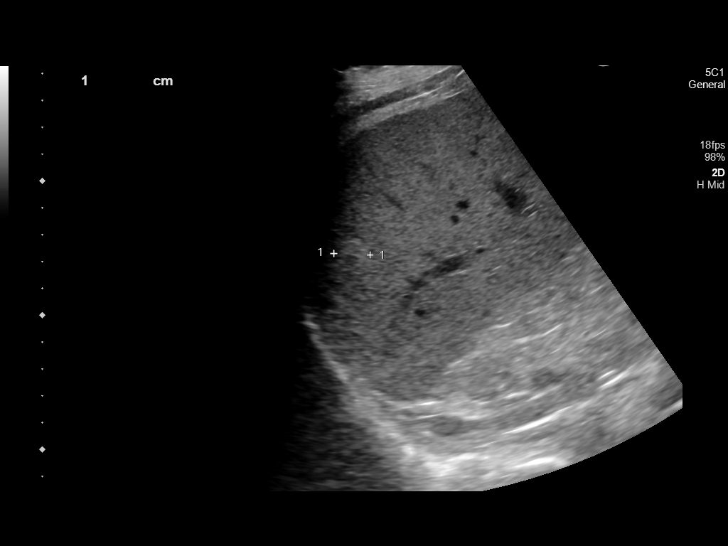

[14 of 25 positions shown; findings below may reference images not displayed]

FINDINGS: Gallbladder:

No gallstones are visualized. The gallbladder wall measures 3.3 mm
in thickness. No sonographic Murphy sign noted by sonographer.

Common bile duct:

Diameter: 3.5 cm.

Liver:

A 1.4 cm x 1.3 cm hyperechoic focus is seen within the right lobe of
the liver. No abnormal flow is seen within this region on color
Doppler evaluation. Diffusely increased echogenicity of the liver
parenchyma is noted. Portal vein is patent on color Doppler imaging
with normal direction of blood flow towards the liver.

Other: The study is limited secondary to the patient's body habitus.
IMPRESSION: 1. Fatty liver.
2. Small hyperechoic lesion within the right lobe which may
represent a small hemangioma. Correlation with nonemergent hepatic
MRI is recommended.

## 2021-01-09 IMAGING — CT CT HEAD W/O CM
3 series · 16 of 47 positions shown, 19 images · non-contrast
Comparison: Head CT dated [DATE].

CLINICAL DATA: CVA 2 weeks ago, dizziness yesterday. Cerebral
hemorrhage suspected.

EXAM:
CT HEAD WITHOUT CONTRAST
TECHNIQUE: Contiguous axial images were obtained from the base of the skull
through the vertex without intravenous contrast.

[Series 2: head wo · axial · 0.46mm/px · z∈[-69,+76]mm · 10 of 35 slices shown, 13 images]
[im 3/35  brain]
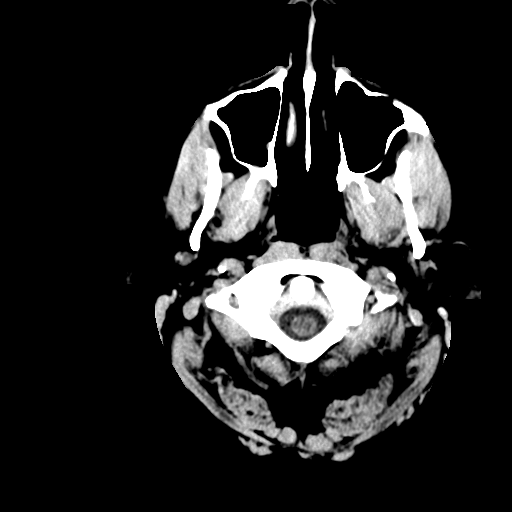
[im 3/35  bone]
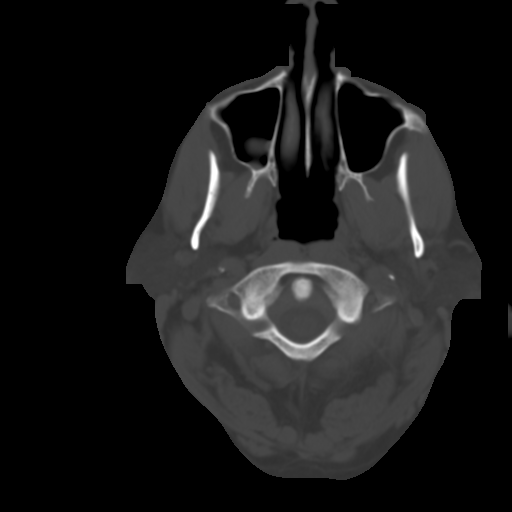
[im 6/35  brain]
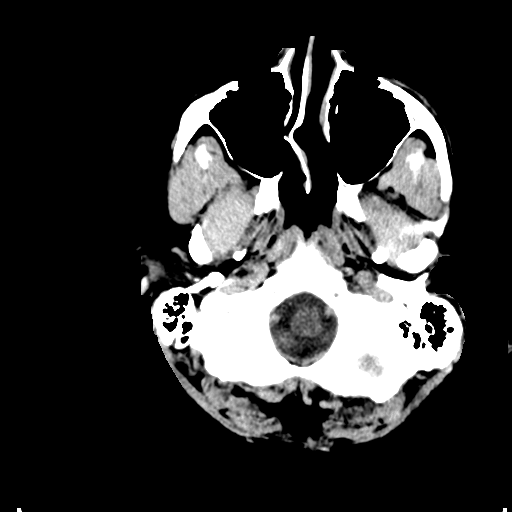
[im 10/35  brain]
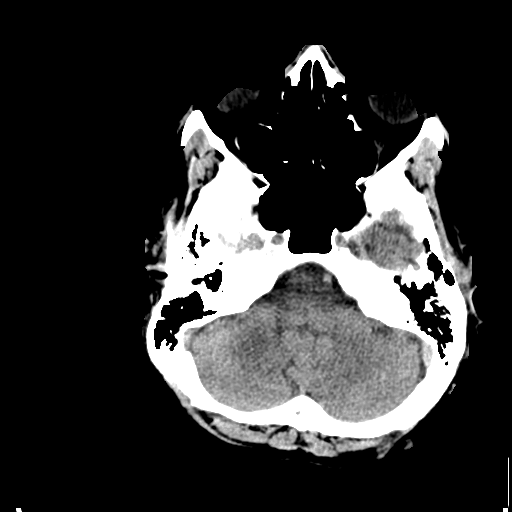
[im 12/35  brain]
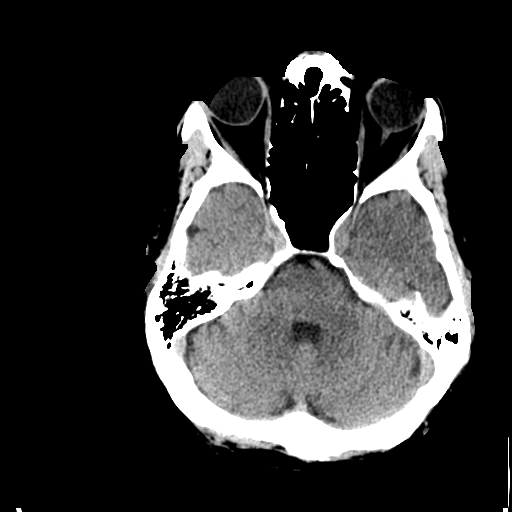
[im 16/35  brain]
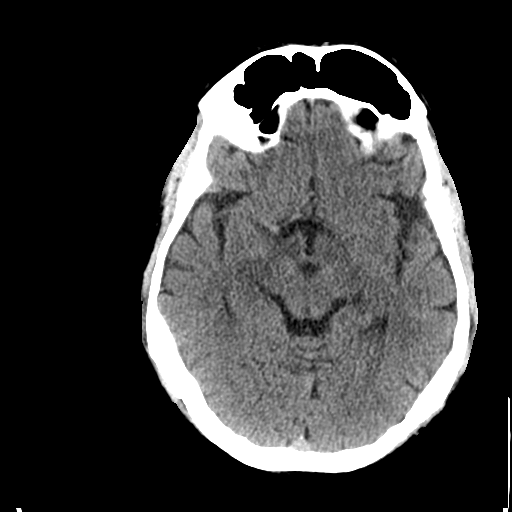
[im 16/35  bone]
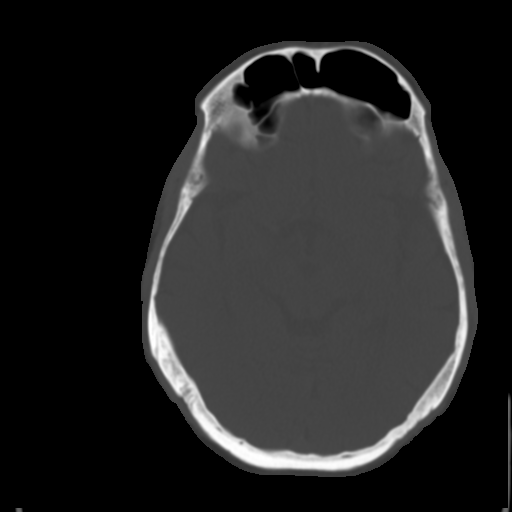
[im 19/35  brain]
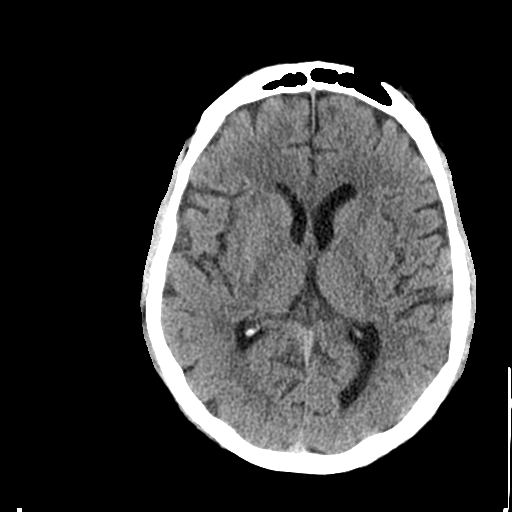
[im 23/35  brain]
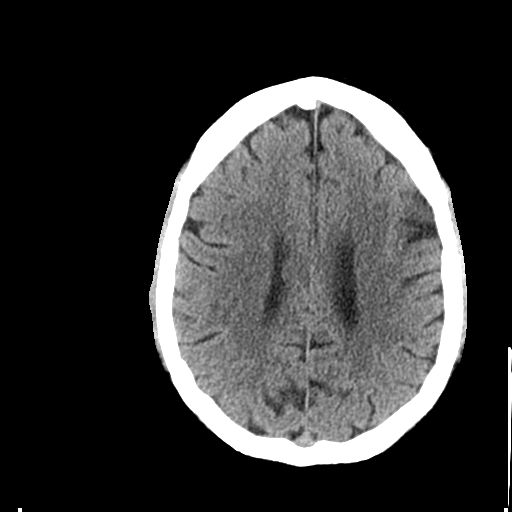
[im 26/35  brain]
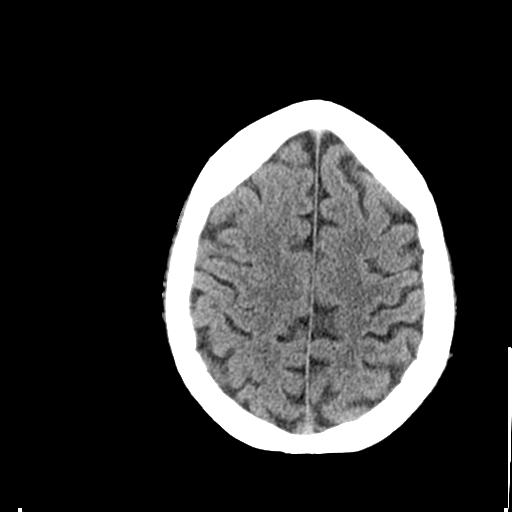
[im 29/35  brain]
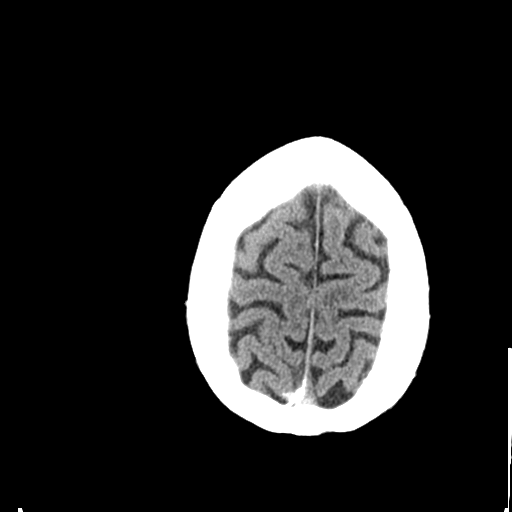
[im 29/35  bone]
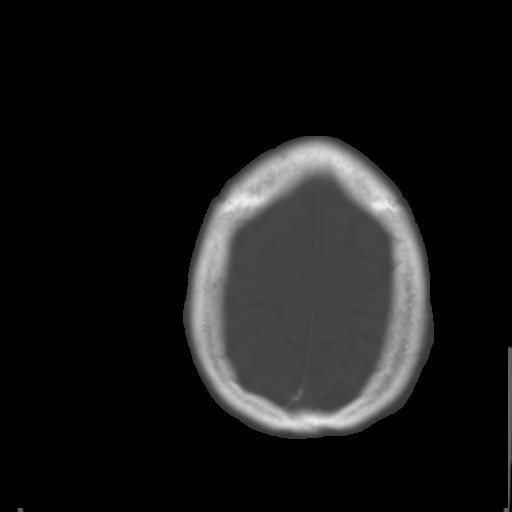
[im 32/35  brain]
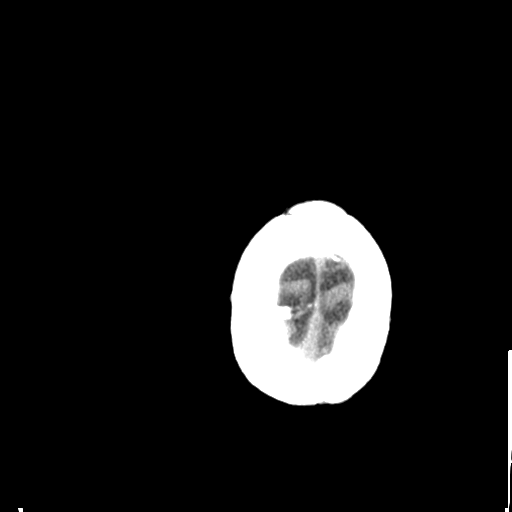

[Series 4: coronal soft tissue · coronal · 0.39mm/px · 3 of 77 slices shown]
[im 26/77  brain]
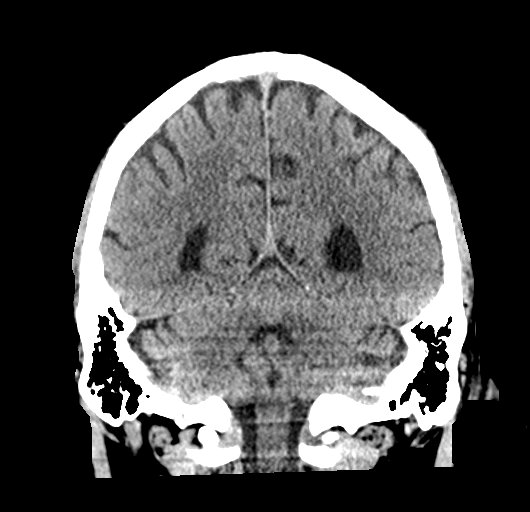
[im 34/77  brain]
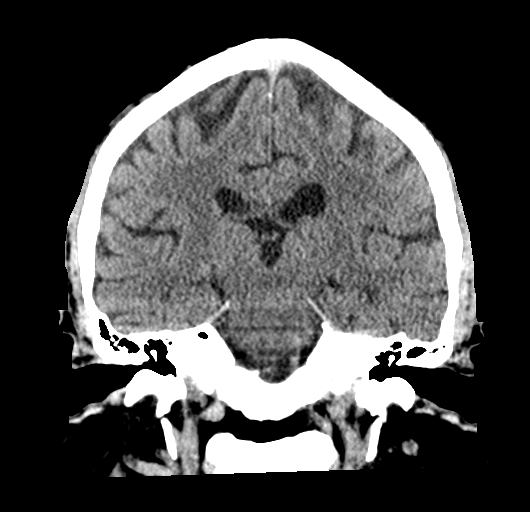
[im 43/77  brain]
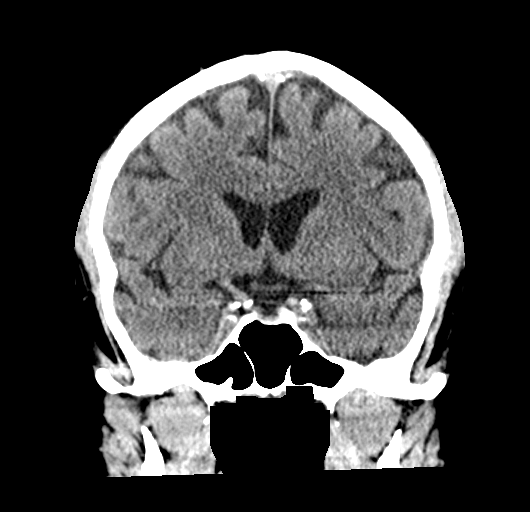

[Series 5: sagittal soft tissue · sagittal · 0.38mm/px · 3 of 66 slices shown]
[im 22/66  brain]
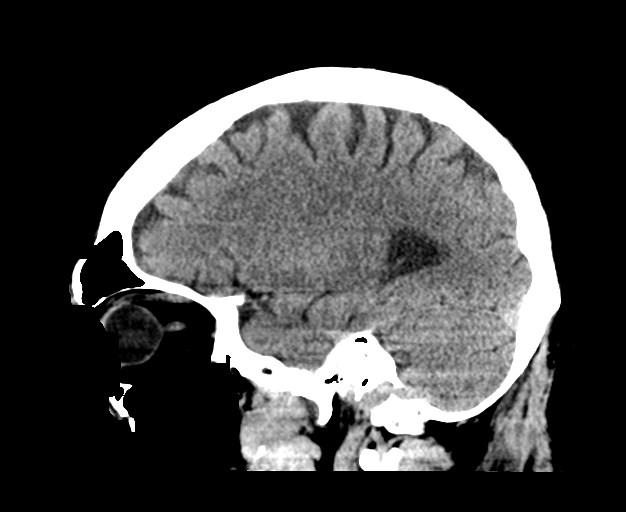
[im 33/66  brain]
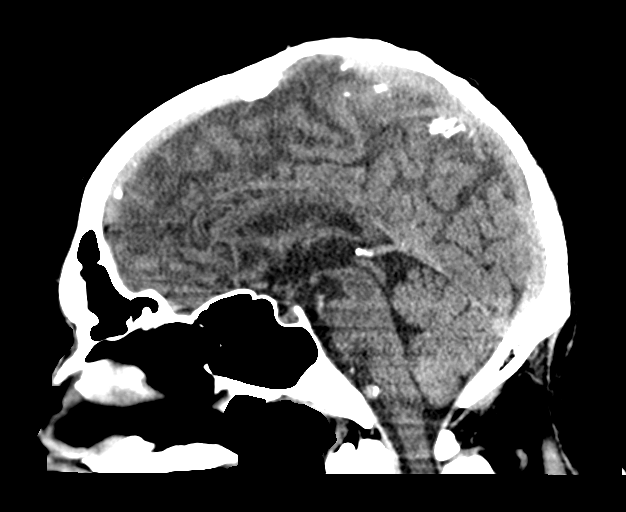
[im 44/66  brain]
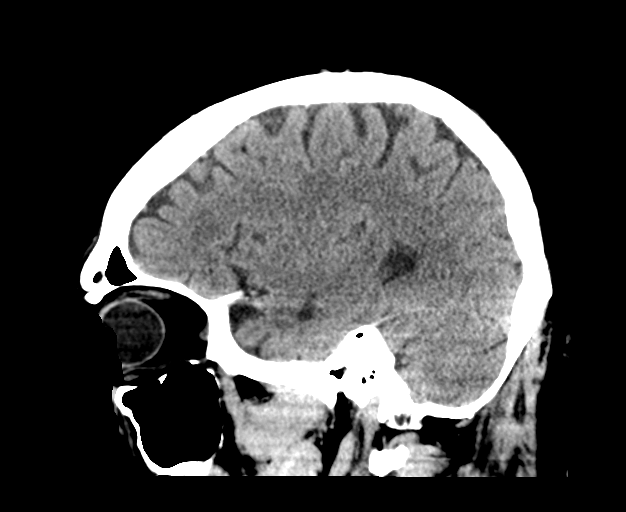

[16 of 47 positions shown; findings below may reference images not displayed]

FINDINGS: Brain: Ventricles are stable in size and configuration. There is no
hemorrhage, edema or other evidence of acute parenchymal
abnormality. No extra-axial hemorrhage. Again noted is a small mass
in the RIGHT lateral ventricle, nonenhancing on recent MRI and most
consistent with a subependymoma.

Vascular: No hyperdense vessel or unexpected calcification.

Skull: Normal. Negative for fracture or focal lesion.

Sinuses/Orbits: No acute finding.

Other: None.
IMPRESSION: 1. No acute findings. No intracranial hemorrhage or edema.
2. Stable small mass in the RIGHT lateral ventricle, described as
nonenhancing on recent MRI report of [DATE] and most consistent
with a subependymoma.

## 2021-01-09 MED ORDER — ONDANSETRON HCL 4 MG/2ML IJ SOLN
4.0000 mg | Freq: Once | INTRAMUSCULAR | Status: AC
  Administered 2021-01-09: 4 mg via INTRAVENOUS
  Filled 2021-01-09: qty 2

## 2021-01-09 MED ORDER — ONDANSETRON 4 MG PO TBDP: 4.0000 mg | ORAL_TABLET | Freq: Three times a day (TID) | ORAL | 0 refills | Status: DC | PRN

## 2021-01-09 MED ORDER — LORAZEPAM 2 MG/ML IJ SOLN
1.0000 mg | Freq: Once | INTRAMUSCULAR | Status: AC
  Administered 2021-01-09: 1 mg via INTRAVENOUS
  Filled 2021-01-09: qty 1

## 2021-01-09 MED ORDER — MECLIZINE HCL 25 MG PO TABS: 25.0000 mg | ORAL_TABLET | Freq: Three times a day (TID) | ORAL | 1 refills | Status: AC | PRN

## 2021-01-09 MED ORDER — LORAZEPAM 1 MG PO TABS: 1.0000 mg | ORAL_TABLET | Freq: Three times a day (TID) | ORAL | 0 refills | Status: AC | PRN

## 2021-01-09 MED ORDER — SODIUM CHLORIDE 0.9 % IV BOLUS
1000.0000 mL | Freq: Once | INTRAVENOUS | Status: AC
  Administered 2021-01-09: 1000 mL via INTRAVENOUS

## 2021-01-09 MED ORDER — MECLIZINE HCL 25 MG PO TABS
50.0000 mg | ORAL_TABLET | Freq: Once | ORAL | Status: AC
  Administered 2021-01-09: 50 mg via ORAL
  Filled 2021-01-09: qty 2

## 2021-01-09 NOTE — Discharge Instructions (Signed)
Your lab tests and imaging today were all reassuring.  Your recent MRI did reveal a small benign mass in your brain.  Your ultrasound today suggests a small vascular malformation in your liver.  Please follow up with your primary care doctor for further monitoring of these incidental findings as needed.

## 2021-01-09 NOTE — ED Notes (Signed)
Pt to ct 

## 2021-01-09 NOTE — ED Provider Notes (Signed)
[facility] ([place] Emergency Department Provider Note  ____________________________________________  Time seen: Approximately 11:34 PM  I have reviewed the triage vital signs and the nursing notes.   HISTORY  Chief Complaint Dizziness    HPI Christopher Gittleman. is a 60 y.o. male with a history of hypertension, obesity, CKD  and CVA 2 weeks ago who comes ED complaining of dizziness that started yesterday.  It is characterized by room spinning, worse with moving his eyes or turning his head side to side.  Associated nausea and vomiting.  Also has a generalized headache and chills.  No chest pain or shortness of breath.  No abdominal pain or diarrhea.  No constipation.  Symptoms are intermittent, no alleviating factors.  No vision changes.  No neck pain.     Past Medical History:  Diagnosis Date  . Allergy   . BCE (basal cell epithelioma)   . HIV infection ([place])   . HTN (hypertension)   . Obesity      Patient Active Problem List   Diagnosis Date Noted  . HLD (hyperlipidemia) 12/24/2020  . GERD (gastroesophageal reflux disease) 12/24/2020  . Depression with anxiety 12/24/2020  . CKD (chronic kidney disease), stage IIIa 12/24/2020  . Brain mass 12/24/2020  . CAD (coronary artery disease) 12/24/2020  . Facial droop 12/24/2020  . Healthcare maintenance 10/21/2018  . NSTEMI (non-ST elevated myocardial infarction) ([place]) 06/12/2017  . Hypertension 04/01/2012  . HIV disease ([place]) 04/01/2012  . Allergic rhinitis, mild 04/01/2012  . Obesity (BMI 35.0-39.9 without comorbidity) 04/01/2012     Past Surgical History:  Procedure Laterality Date  . Cataract surgery Bilateral   . LEFT HEART CATH AND CORONARY ANGIOGRAPHY N/A 06/13/2017   Procedure: LEFT HEART CATH AND CORONARY ANGIOGRAPHY;  Surgeon: Minna Merritts, MD;  Location: [place] [facility];  Service: Cardiovascular;  Laterality: N/A;     Prior to Admission medications   Medication Sig Start Date End  Date Taking? Authorizing Provider  LORazepam (ATIVAN) 1 MG tablet Take 1 tablet (1 mg total) by mouth every 8 (eight) hours as needed for up to 3 days (vertigo). 01/09/21 01/12/21 Yes Carrie Mew, MD  meclizine (ANTIVERT) 25 MG tablet Take 1 tablet (25 mg total) by mouth 3 (three) times daily as needed for dizziness or nausea. 01/09/21  Yes Carrie Mew, MD  ondansetron (ZOFRAN ODT) 4 MG disintegrating tablet Take 1 tablet (4 mg total) by mouth every 8 (eight) hours as needed for nausea or vomiting. 01/09/21  Yes Carrie Mew, MD  ALPRAZolam Duanne Moron) 1 MG tablet Take 1 mg by mouth 2 (two) times daily. 10/15/20   [provider]  aspirin 81 MG chewable tablet Chew 1 tablet (81 mg total) daily by mouth. 06/14/17   Max Sane, MD  atorvastatin (LIPITOR) 40 MG tablet Take 0.5 tablets (20 mg total) daily by mouth. Patient not taking: Reported on 12/24/2020 06/13/17   Max Sane, MD  buPROPion [facility] SR) 150 MG 12 hr tablet Take 150 mg daily by mouth. 05/25/17   [provider]  citalopram (CELEXA) 40 MG tablet Take 40 mg daily by mouth. 05/25/17   [provider]  efavirenz-emtricitabine-tenofovir (ATRIPLA) 600-200-300 MG tablet Take 1 tablet by mouth at bedtime.    [provider]  ezetimibe (ZETIA) 10 MG tablet Take 10 mg by mouth daily. 11/03/20   [provider]  omeprazole (PRILOSEC) 20 MG capsule Take 20 mg by mouth daily. 10/17/18   [provider]  predniSONE (DELTASONE) 20  MG tablet Take 60 mg daily for 6 days, then take 50 mg for one day, then 40 mg for one day, then 30 mg for one day, then 20 mg for one day then 10 mg for one day, then stop. 12/25/20   Regalado, Belkys A, MD  TRINTELLIX 20 MG TABS tablet Take 20 mg by mouth daily. 09/09/20   [provider]     Allergies Penicillins   Family History  Problem Relation Age of Onset  . CAD Mother        a. MI at age 76  . CAD Maternal Grandmother   . CAD Paternal  Grandmother     Social History Social History   Tobacco Use  . Smoking status: Never Smoker  . Smokeless tobacco: Never Used  Vaping Use  . Vaping Use: Never used  Substance Use Topics  . Alcohol use: Yes    Comment: rare  . Drug use: No    Review of Systems  Constitutional:   No fever or chills.  ENT:   No sore throat. No rhinorrhea. Cardiovascular:   No chest pain or syncope. Respiratory:   No dyspnea or cough. Gastrointestinal:   Negative for abdominal pain, positive vomiting Musculoskeletal:   Negative for focal pain or swelling All other systems reviewed and are negative except as documented above in ROS and HPI.  ____________________________________________   PHYSICAL EXAM:  VITAL SIGNS: ED Triage Vitals  Enc Vitals Group     BP 01/09/21 1912 90/79     Pulse Rate 01/09/21 1912 (!) 112     Resp 01/09/21 1912 16     Temp 01/09/21 1912 98.3 F (36.8 C)     Temp src --      SpO2 01/09/21 1912 100 %     Weight 01/09/21 1909 222 lb (100.7 kg)     Height 01/09/21 1909 5\' 10"  (1.778 m)     Head Circumference --      Peak Flow --      Pain Score --      Pain Loc --      Pain Edu? --      Excl. in [place]? --     Vital signs reviewed, nursing assessments reviewed.   Constitutional:   Alert and oriented. Non-toxic appearance. Eyes:   Conjunctivae are normal. EOMI. PERRL.  No nystagmus ENT      Head:   Normocephalic and atraumatic.  External canals and TMs normal      Nose:   Normal      Mouth/Throat: Normal, moist mucosa.      Neck:   No meningismus. Full ROM. Hematological/Lymphatic/Immunilogical:   No cervical lymphadenopathy. Cardiovascular:   RRR. Symmetric bilateral radial and DP pulses.  No murmurs. Cap refill less than 2 seconds. Respiratory:   Normal respiratory effort without tachypnea/retractions. Breath sounds are clear and equal bilaterally. No wheezes/rales/rhonchi. Gastrointestinal:   Soft with mild right upper quadrant tenderness. Non distended.  There is no CVA tenderness.  No rebound, rigidity, or guarding. Genitourinary:   deferred Musculoskeletal:   Normal range of motion in all extremities. No joint effusions.  No lower extremity tenderness.  No edema. Neurologic:   Normal speech and language.  Motor grossly intact. No acute focal neurologic deficits are appreciated.  Skin:    Skin is warm, dry and intact. No rash noted.  No petechiae, purpura, or bullae.  ____________________________________________    LABS (pertinent positives/negatives) (all labs ordered are listed, but only abnormal results are  displayed) Labs Reviewed  BASIC METABOLIC PANEL - Abnormal; Notable for the following components:      Result Value   Sodium 133 (*)    Potassium 3.2 (*)    CO2 18 (*)    Glucose, Bld 158 (*)    BUN 29 (*)    Creatinine, Ser 1.87 (*)    GFR, Estimated 40 (*)    All other components within normal limits  CBC - Abnormal; Notable for the following components:   WBC 12.8 (*)    All other components within normal limits  RESP PANEL BY RT-PCR (FLU A&B, COVID) ARPGX2  URINALYSIS, COMPLETE (UACMP) WITH MICROSCOPIC  TROPONIN I (HIGH SENSITIVITY)   ____________________________________________   EKG  Interpreted by me Normal sinus rhythm rate of 99, normal axis and intervals.  Normal QRS ST segments and T waves.  No ischemic changes.  ____________________________________________    RADIOLOGY  CT Head Wo Contrast  Result Date: 01/09/2021 CLINICAL DATA:  CVA 2 weeks ago, dizziness yesterday. Cerebral hemorrhage suspected. EXAM: CT HEAD WITHOUT CONTRAST TECHNIQUE: Contiguous axial images were obtained from the base of the skull through the vertex without intravenous contrast. COMPARISON:  Head CT dated 12/24/2020. FINDINGS: Brain: Ventricles are stable in size and configuration. There is no hemorrhage, edema or other evidence of acute parenchymal abnormality. No extra-axial hemorrhage. Again noted is a small mass in the RIGHT  lateral ventricle, nonenhancing on recent MRI and most consistent with a subependymoma. Vascular: No hyperdense vessel or unexpected calcification. Skull: Normal. Negative for fracture or focal lesion. Sinuses/Orbits: No acute finding. Other: None. IMPRESSION: 1. No acute findings. No intracranial hemorrhage or edema. 2. Stable small mass in the RIGHT lateral ventricle, described as nonenhancing on recent MRI report of 12/24/2020 and most consistent with a subependymoma. Electronically Signed   By: Franki Cabot M.D.   On: 01/09/2021 20:07   DG Chest Portable 1 View  Result Date: 01/09/2021 CLINICAL DATA:  Cough, weakness EXAM: PORTABLE CHEST 1 VIEW COMPARISON:  Chest x-ray dated 06/12/2017. FINDINGS: Heart size and mediastinal contours are within normal limits. Lungs are clear. No pleural effusion or pneumothorax is seen. Osseous structures about the chest are unremarkable. IMPRESSION: No active disease. No evidence of pneumonia or pulmonary edema. Electronically Signed   By: Franki Cabot M.D.   On: 01/09/2021 21:24   US ABDOMEN LIMITED RUQ (LIVER/GB)  Result Date: 01/09/2021 CLINICAL DATA:  Right upper quadrant pain and vomiting. EXAM: ULTRASOUND ABDOMEN LIMITED RIGHT UPPER QUADRANT COMPARISON:  None. FINDINGS: Gallbladder: No gallstones are visualized. The gallbladder wall measures 3.3 mm in thickness. No sonographic Murphy sign noted by sonographer. Common bile duct: Diameter: 3.5 cm. Liver: A 1.4 cm x 1.3 cm hyperechoic focus is seen within the right lobe of the liver. No abnormal flow is seen within this region on color Doppler evaluation. Diffusely increased echogenicity of the liver parenchyma is noted. Portal vein is patent on color Doppler imaging with normal direction of blood flow towards the liver. Other: The study is limited secondary to the patient's body habitus. IMPRESSION: 1. Fatty liver. 2. Small hyperechoic lesion within the right lobe which may represent a small hemangioma. Correlation  with nonemergent hepatic MRI is recommended. Electronically Signed   By: Virgina Norfolk M.D.   On: 01/09/2021 23:03    ____________________________________________   PROCEDURES Procedures  ____________________________________________  DIFFERENTIAL DIAGNOSIS   Intracranial hemorrhage, cholecystitis, peripheral vertigo, viral illness, dehydration, electrolyte abnormality  CLINICAL IMPRESSION / ASSESSMENT AND PLAN / ED COURSE  Medications  ordered in the ED: Medications  sodium chloride 0.9 % bolus 1,000 mL (0 mLs Intravenous Stopped 01/09/21 2032)  ondansetron (ZOFRAN) injection 4 mg (4 mg Intravenous Given 01/09/21 1925)  meclizine (ANTIVERT) tablet 50 mg (50 mg Oral Given 01/09/21 2201)  LORazepam (ATIVAN) injection 1 mg (1 mg Intravenous Given 01/09/21 2201)    Pertinent labs & imaging results that were available during my care of the patient were reviewed by me and considered in my medical decision making (see chart for details).  Christopher Boyden Aikam Vinje. was evaluated in Emergency Department on 01/09/2021 for the symptoms described in the history of present illness. He was evaluated in the context of the global COVID-19 pandemic, which necessitated consideration that the patient might be at risk for infection with the SARS-CoV-2 virus that causes COVID-19. Institutional protocols and algorithms that pertain to the evaluation of patients at risk for COVID-19 are in a state of rapid change based on information released by regulatory bodies including the CDC and federal and state organizations. These policies and algorithms were followed during the patient's care in the ED.   Patient presents with multiple symptoms including vertigo, nausea vomiting, generalized headache.  Chills.  Most likely viral syndrome.  Ultrasound right upper quadrant negative, labs reassuring.  CT head obtained due to recent stroke history, negative for hemorrhage.  Chest x-ray unremarkable.  Feeling better after fluids,  meclizine, Ativan.  Discharged home with prescriptions for supportive care.  COVID and flu are negative.  Follow-up with ENT as needed.  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  Doubt meningitis encephalitis, new stroke, pancreatitis or bowel obstruction.  Abdomen is nonsurgical.  Vital signs are normal      ____________________________________________   FINAL CLINICAL IMPRESSION(S) / ED DIAGNOSES    Final diagnoses:  Vertigo     ED Discharge Orders         Ordered    meclizine (ANTIVERT) 25 MG tablet  3 times daily PRN        01/09/21 2333    LORazepam (ATIVAN) 1 MG tablet  Every 8 hours PRN        01/09/21 2333    ondansetron (ZOFRAN ODT) 4 MG disintegrating tablet  Every 8 hours PRN        01/09/21 2333          Portions of this note were generated with dragon dictation software. Dictation errors may occur despite best attempts at proofreading.   Carrie Mew, MD 01/09/21 [phone]

## 2021-01-09 NOTE — ED Triage Notes (Addendum)
Pt with cva two weeks ago per ems. Per ems pt states began to have dizziness yesterday. Pt states has been vomiting. Pt states does have a headache, no chest pain. Pt denies known fever, but states has had chills. Pt is sweaty.

## 2021-01-14 ENCOUNTER — Ambulatory Visit
Admission: RE | Admit: 2021-01-14 | Discharge: 2021-01-14 | Disposition: A | Discharge status: Home / Self Care | Payer: BC Managed Care – PPO | Source: Ambulatory Visit | Attending: Physician Assistant | Admitting: Physician Assistant

## 2021-01-14 ENCOUNTER — Other Ambulatory Visit: Payer: Self-pay | Admitting: Physician Assistant

## 2021-01-14 ENCOUNTER — Other Ambulatory Visit: Payer: Self-pay

## 2021-01-14 DIAGNOSIS — R531 Weakness: Secondary | ICD-10-CM | POA: Diagnosis not present

## 2021-01-14 DIAGNOSIS — R200 Anesthesia of skin: Secondary | ICD-10-CM | POA: Diagnosis not present

## 2021-01-14 DIAGNOSIS — G510 Bell's palsy: Secondary | ICD-10-CM | POA: Diagnosis not present

## 2021-01-14 DIAGNOSIS — R270 Ataxia, unspecified: Secondary | ICD-10-CM | POA: Diagnosis not present

## 2021-01-14 DIAGNOSIS — G9389 Other specified disorders of brain: Secondary | ICD-10-CM | POA: Diagnosis not present

## 2021-01-14 IMAGING — MR MR HEAD W/O CM
12 series · 46 of 48 positions shown · non-contrast
Comparison: [DATE]

CLINICAL DATA: Facial paralysis, numbness, weakness

EXAM:
MRI HEAD WITHOUT CONTRAST
TECHNIQUE: Multiplanar, multiecho pulse sequences of the brain and surrounding
structures were obtained without intravenous contrast.

[Series 5: ax dwi_tracew · axial · 3.0mm · 0.65mm/px · z∈[-57,+97]mm · 5 of 96 slices shown]
[im 1/96]
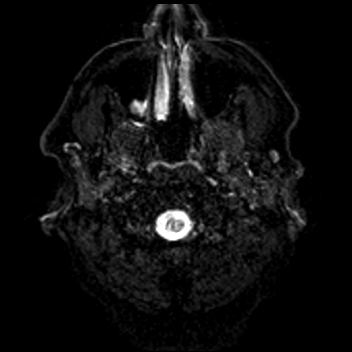
[im 24/96]
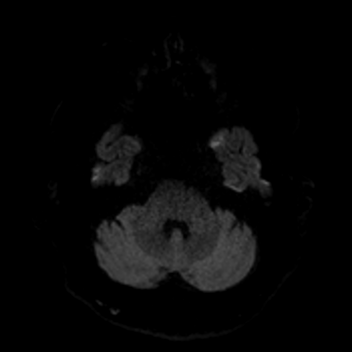
[im 48/96]
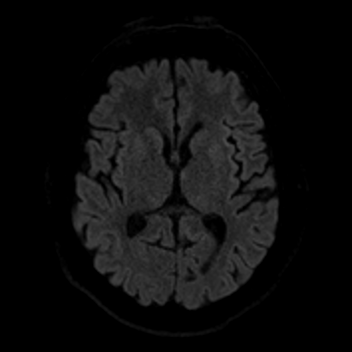
[im 72/96]
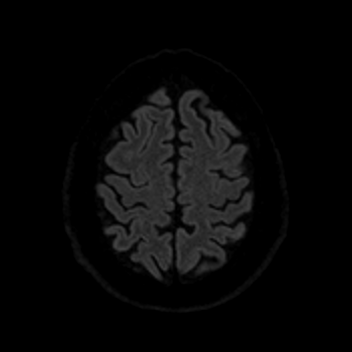
[im 96/96]
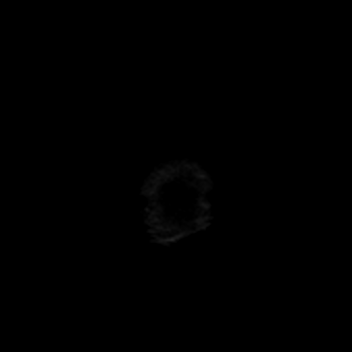

[Series 6: ax dwi_adc · axial · 3.0mm · 0.65mm/px · z∈[-57,+97]mm · 2 of 48 slices shown]
[im 1/48]
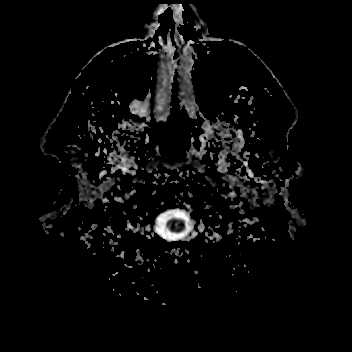
[im 48/48]
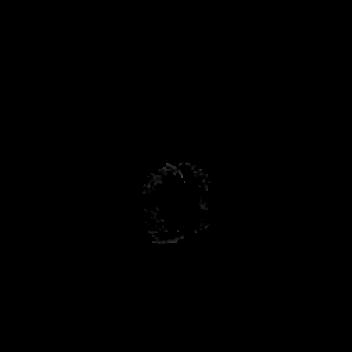

[Series 7: cor dwi_tracew · coronal · 5.0mm · 0.65mm/px · 5 of 80 slices shown]
[im 1/80]
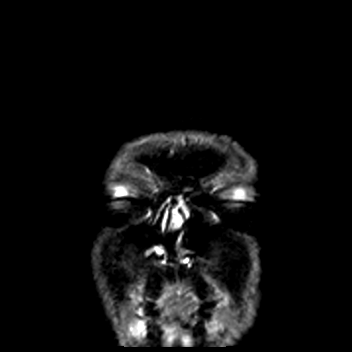
[im 20/80]
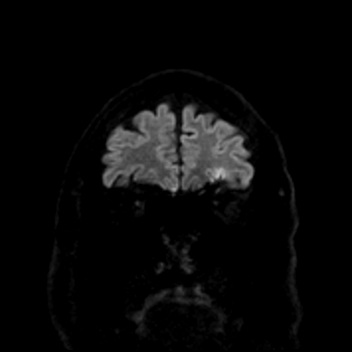
[im 40/80]
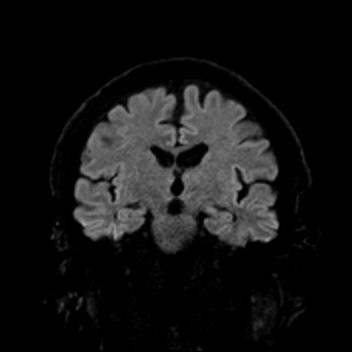
[im 60/80]
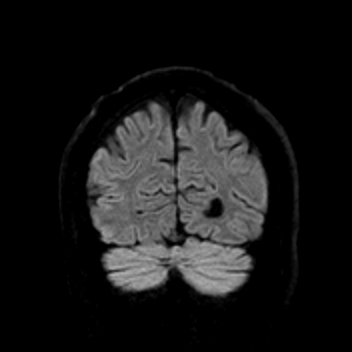
[im 80/80]
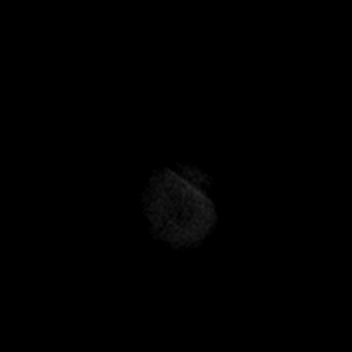

[Series 8: cor dwi_adc · coronal · 5.0mm · 0.65mm/px · 3 of 40 slices shown]
[im 1/40]
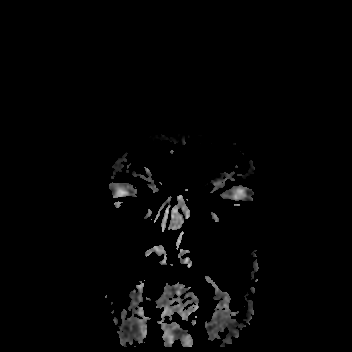
[im 20/40]
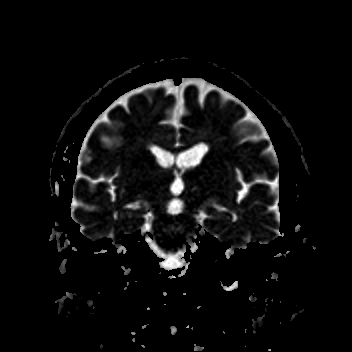
[im 40/40]
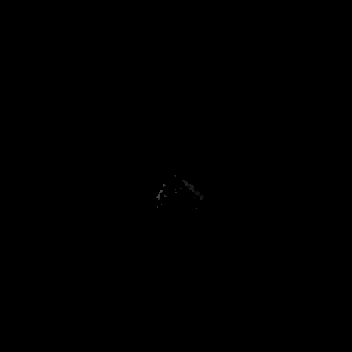

[Series 9: T1 · sagittal · 5.0mm · 0.62mm/px · 2 of 25 slices shown (1 of 2)]
[im 1/25]
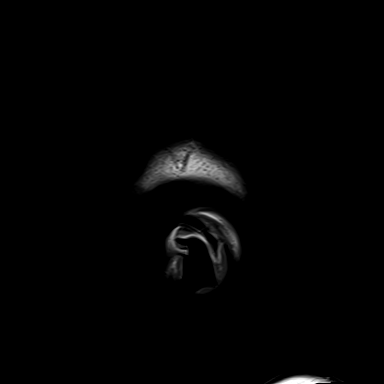
[im 25/25]
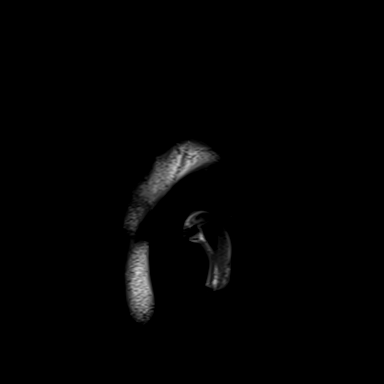

[Series 10: T2 · axial · 5.0mm · 0.53mm/px · z∈[-61,+100]mm · 2 of 28 slices shown (1 of 2)]
[im 1/28]
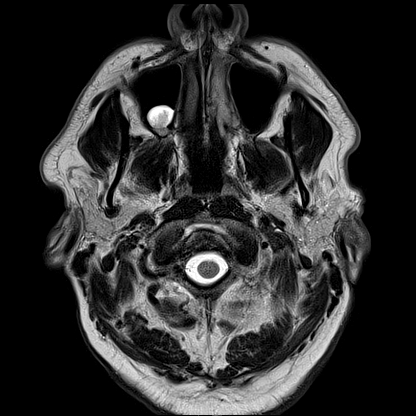
[im 28/28]
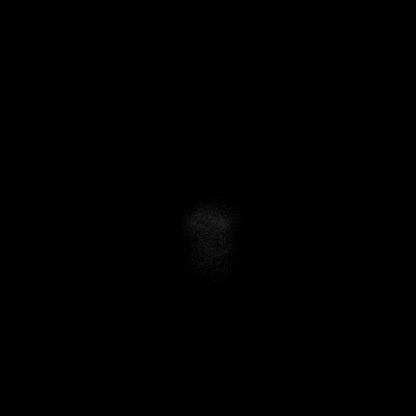

[Series 11: mag_images · axial · 3.0mm · 0.90mm/px · z∈[-67,+108]mm · 4 of 60 slices shown]
[im 1/60]
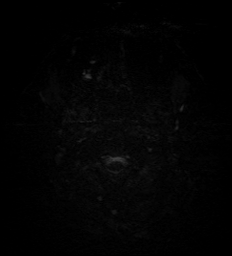
[im 20/60]
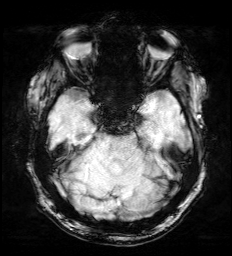
[im 40/60]
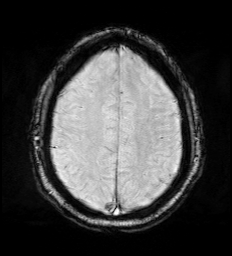
[im 60/60]
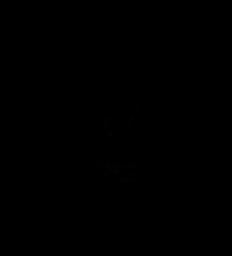

[Series 12: pha_images · axial · 3.0mm · 0.90mm/px · z∈[-65,+108]mm · 4 of 59 slices shown]
[im 1/59]
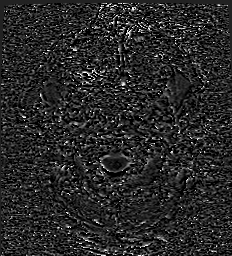
[im 20/59]
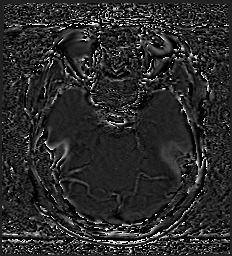
[im 39/59]
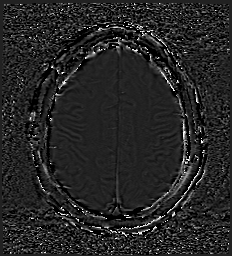
[im 59/59]
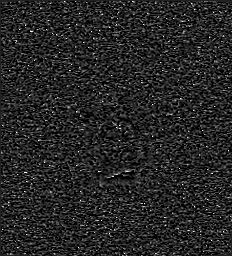

[Series 13: swi_images · axial · 3.0mm · 0.90mm/px · z∈[-67,+108]mm · 4 of 60 slices shown]
[im 1/60]
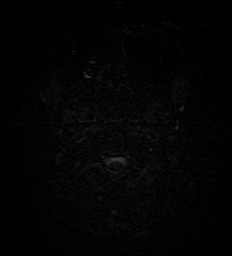
[im 20/60]
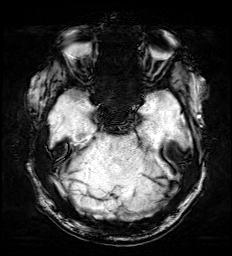
[im 40/60]
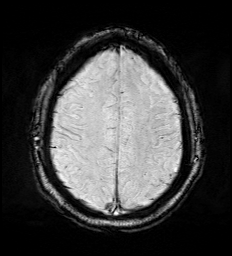
[im 60/60]
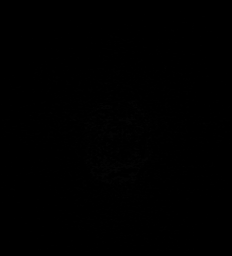

[Series 15: FLAIR · axial · 3.0mm · 0.53mm/px · z∈[-61,+100]mm · 4 of 55 slices shown]
[im 1/55]
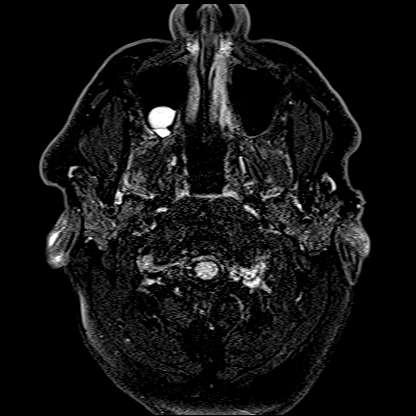
[im 19/55]
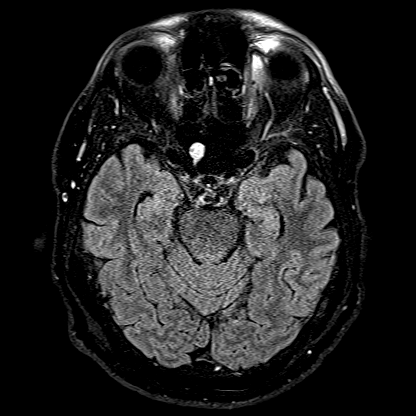
[im 37/55]
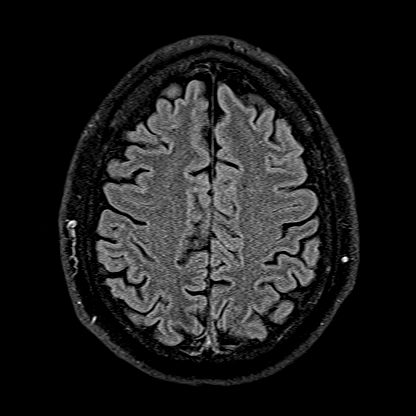
[im 55/55]
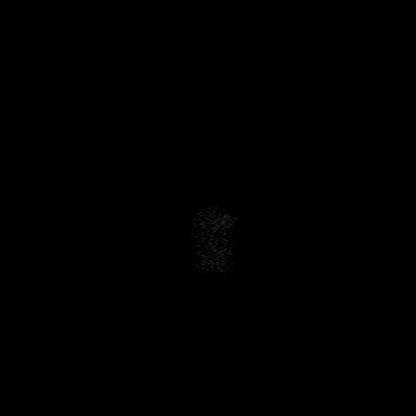

[Series 16: T1 · axial · 1.0mm · 0.98mm/px · z∈[-65,+108]mm · 9 of 174 slices shown (2 of 2)]
[im 1/174]
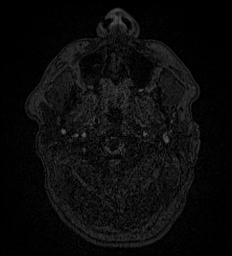
[im 18/174]
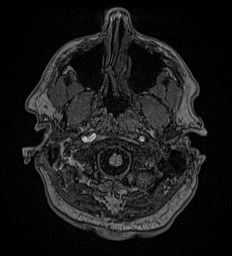
[im 35/174]
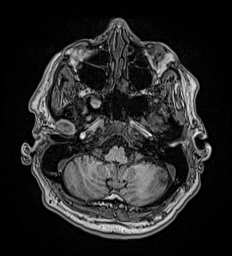
[im 52/174]
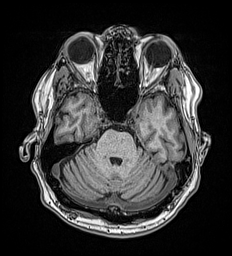
[im 70/174]
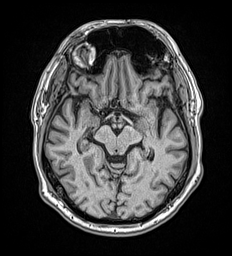
[im 104/174]
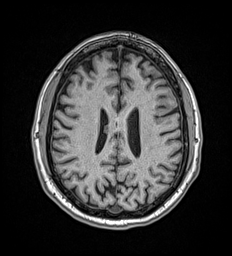
[im 122/174]
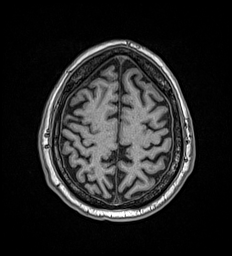
[im 139/174]
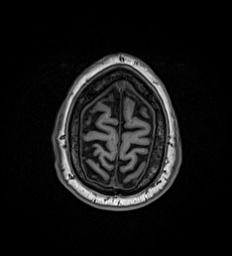
[im 174/174]
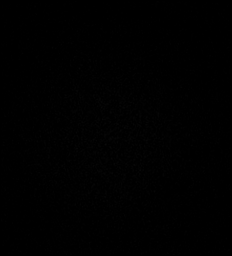

[Series 17: T2 · coronal · 5.0mm · 0.57mm/px · 2 of 29 slices shown (2 of 2)]
[im 1/29]
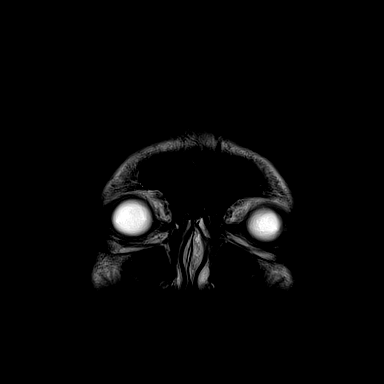
[im 29/29]
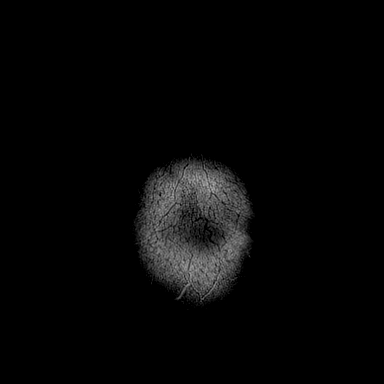

[46 of 48 positions shown; findings below may reference images not displayed]

FINDINGS: Brain: There is no acute infarction or intracranial hemorrhage.
Stable subcentimeter lesion of the body of the right lateral
ventricle. There is no new intracranial mass, mass effect, or edema.
There is no hydrocephalus or extra-axial fluid collection.
Ventricles and sulci are stable in size and configuration. Few small
foci of T2 hyperintensity in the supratentorial white matter are
nonspecific and may reflect minor chronic microvascular ischemic
changes.

Vascular: Major vessel flow voids at the skull base are preserved.

Skull and upper cervical spine: Normal marrow signal is preserved.

Sinuses/Orbits: Minor mucosal thickening. Retention cyst of the
right sphenoid sinus. Bilateral lens replacements.

Other: Sella is unremarkable. Mild patchy mastoid fluid
opacification.
IMPRESSION: No evidence of recent infarction, hemorrhage, or other acute
abnormality.

Stable small mass of the right lateral ventricle most consistent
with a subependymoma.

## 2021-01-20 DIAGNOSIS — Z79899 Other long term (current) drug therapy: Secondary | ICD-10-CM | POA: Diagnosis not present

## 2021-01-20 DIAGNOSIS — R42 Dizziness and giddiness: Secondary | ICD-10-CM | POA: Diagnosis not present

## 2021-01-20 DIAGNOSIS — G510 Bell's palsy: Secondary | ICD-10-CM | POA: Diagnosis not present

## 2021-01-20 DIAGNOSIS — Z21 Asymptomatic human immunodeficiency virus [HIV] infection status: Secondary | ICD-10-CM | POA: Diagnosis not present

## 2021-01-20 DIAGNOSIS — G609 Hereditary and idiopathic neuropathy, unspecified: Secondary | ICD-10-CM | POA: Diagnosis not present

## 2021-01-20 DIAGNOSIS — R29810 Facial weakness: Secondary | ICD-10-CM | POA: Diagnosis not present

## 2021-01-20 DIAGNOSIS — R94131 Abnormal electromyogram [EMG]: Secondary | ICD-10-CM | POA: Diagnosis not present

## 2021-01-20 DIAGNOSIS — R29898 Other symptoms and signs involving the musculoskeletal system: Secondary | ICD-10-CM | POA: Diagnosis not present

## 2021-02-02 DIAGNOSIS — R200 Anesthesia of skin: Secondary | ICD-10-CM | POA: Diagnosis not present

## 2021-02-02 DIAGNOSIS — M4802 Spinal stenosis, cervical region: Secondary | ICD-10-CM | POA: Diagnosis not present

## 2021-02-02 DIAGNOSIS — R479 Unspecified speech disturbances: Secondary | ICD-10-CM | POA: Diagnosis not present

## 2021-02-02 DIAGNOSIS — M4712 Other spondylosis with myelopathy, cervical region: Secondary | ICD-10-CM | POA: Diagnosis not present

## 2021-02-02 DIAGNOSIS — R42 Dizziness and giddiness: Secondary | ICD-10-CM | POA: Diagnosis not present

## 2021-02-02 DIAGNOSIS — G510 Bell's palsy: Secondary | ICD-10-CM | POA: Diagnosis not present

## 2021-02-02 DIAGNOSIS — R531 Weakness: Secondary | ICD-10-CM | POA: Diagnosis not present

## 2021-02-02 DIAGNOSIS — R29898 Other symptoms and signs involving the musculoskeletal system: Secondary | ICD-10-CM | POA: Diagnosis not present

## 2021-02-02 DIAGNOSIS — M9971 Connective tissue and disc stenosis of intervertebral foramina of cervical region: Secondary | ICD-10-CM | POA: Diagnosis not present

## 2021-02-02 DIAGNOSIS — G609 Hereditary and idiopathic neuropathy, unspecified: Secondary | ICD-10-CM | POA: Diagnosis not present

## 2021-02-04 DIAGNOSIS — G609 Hereditary and idiopathic neuropathy, unspecified: Secondary | ICD-10-CM | POA: Diagnosis not present

## 2021-02-04 DIAGNOSIS — R42 Dizziness and giddiness: Secondary | ICD-10-CM | POA: Diagnosis not present

## 2021-02-04 DIAGNOSIS — R29898 Other symptoms and signs involving the musculoskeletal system: Secondary | ICD-10-CM | POA: Diagnosis not present

## 2021-02-04 DIAGNOSIS — G510 Bell's palsy: Secondary | ICD-10-CM | POA: Diagnosis not present

## 2021-02-10 DIAGNOSIS — M25551 Pain in right hip: Secondary | ICD-10-CM | POA: Diagnosis not present

## 2021-02-10 DIAGNOSIS — R262 Difficulty in walking, not elsewhere classified: Secondary | ICD-10-CM | POA: Diagnosis not present

## 2021-02-10 DIAGNOSIS — R531 Weakness: Secondary | ICD-10-CM | POA: Diagnosis not present

## 2021-02-10 DIAGNOSIS — R42 Dizziness and giddiness: Secondary | ICD-10-CM | POA: Diagnosis not present

## 2021-02-11 DIAGNOSIS — E042 Nontoxic multinodular goiter: Secondary | ICD-10-CM | POA: Diagnosis not present

## 2021-02-11 DIAGNOSIS — E041 Nontoxic single thyroid nodule: Secondary | ICD-10-CM | POA: Diagnosis not present

## 2021-02-15 DIAGNOSIS — R42 Dizziness and giddiness: Secondary | ICD-10-CM | POA: Diagnosis not present

## 2021-02-15 DIAGNOSIS — M25551 Pain in right hip: Secondary | ICD-10-CM | POA: Diagnosis not present

## 2021-02-15 DIAGNOSIS — R262 Difficulty in walking, not elsewhere classified: Secondary | ICD-10-CM | POA: Diagnosis not present

## 2021-02-15 DIAGNOSIS — R531 Weakness: Secondary | ICD-10-CM | POA: Diagnosis not present

## 2021-02-17 DIAGNOSIS — M25551 Pain in right hip: Secondary | ICD-10-CM | POA: Diagnosis not present

## 2021-02-17 DIAGNOSIS — R262 Difficulty in walking, not elsewhere classified: Secondary | ICD-10-CM | POA: Diagnosis not present

## 2021-02-17 DIAGNOSIS — R42 Dizziness and giddiness: Secondary | ICD-10-CM | POA: Diagnosis not present

## 2021-02-17 DIAGNOSIS — R531 Weakness: Secondary | ICD-10-CM | POA: Diagnosis not present

## 2021-03-01 DIAGNOSIS — R531 Weakness: Secondary | ICD-10-CM | POA: Diagnosis not present

## 2021-03-01 DIAGNOSIS — R42 Dizziness and giddiness: Secondary | ICD-10-CM | POA: Diagnosis not present

## 2021-03-01 DIAGNOSIS — M25551 Pain in right hip: Secondary | ICD-10-CM | POA: Diagnosis not present

## 2021-03-01 DIAGNOSIS — R262 Difficulty in walking, not elsewhere classified: Secondary | ICD-10-CM | POA: Diagnosis not present

## 2021-03-03 DIAGNOSIS — M25551 Pain in right hip: Secondary | ICD-10-CM | POA: Diagnosis not present

## 2021-03-03 DIAGNOSIS — R531 Weakness: Secondary | ICD-10-CM | POA: Diagnosis not present

## 2021-03-03 DIAGNOSIS — R42 Dizziness and giddiness: Secondary | ICD-10-CM | POA: Diagnosis not present

## 2021-03-03 DIAGNOSIS — R262 Difficulty in walking, not elsewhere classified: Secondary | ICD-10-CM | POA: Diagnosis not present

## 2021-03-11 DIAGNOSIS — H35373 Puckering of macula, bilateral: Secondary | ICD-10-CM | POA: Diagnosis not present

## 2021-03-15 DIAGNOSIS — G510 Bell's palsy: Secondary | ICD-10-CM | POA: Diagnosis not present

## 2021-03-15 DIAGNOSIS — R29898 Other symptoms and signs involving the musculoskeletal system: Secondary | ICD-10-CM | POA: Diagnosis not present

## 2021-03-15 DIAGNOSIS — R42 Dizziness and giddiness: Secondary | ICD-10-CM | POA: Diagnosis not present

## 2021-03-15 DIAGNOSIS — R836 Abnormal cytological findings in cerebrospinal fluid: Secondary | ICD-10-CM | POA: Diagnosis not present

## 2021-03-15 DIAGNOSIS — G609 Hereditary and idiopathic neuropathy, unspecified: Secondary | ICD-10-CM | POA: Diagnosis not present

## 2021-03-16 DIAGNOSIS — R29898 Other symptoms and signs involving the musculoskeletal system: Secondary | ICD-10-CM | POA: Diagnosis not present

## 2021-03-16 DIAGNOSIS — G510 Bell's palsy: Secondary | ICD-10-CM | POA: Diagnosis not present

## 2021-03-16 DIAGNOSIS — R42 Dizziness and giddiness: Secondary | ICD-10-CM | POA: Diagnosis not present

## 2021-03-16 DIAGNOSIS — G609 Hereditary and idiopathic neuropathy, unspecified: Secondary | ICD-10-CM | POA: Diagnosis not present

## 2021-03-17 DIAGNOSIS — G609 Hereditary and idiopathic neuropathy, unspecified: Secondary | ICD-10-CM | POA: Diagnosis not present

## 2021-03-17 DIAGNOSIS — R531 Weakness: Secondary | ICD-10-CM | POA: Diagnosis not present

## 2021-03-17 DIAGNOSIS — R29898 Other symptoms and signs involving the musculoskeletal system: Secondary | ICD-10-CM | POA: Diagnosis not present

## 2021-03-17 DIAGNOSIS — G510 Bell's palsy: Secondary | ICD-10-CM | POA: Diagnosis not present

## 2021-03-17 DIAGNOSIS — H35373 Puckering of macula, bilateral: Secondary | ICD-10-CM | POA: Diagnosis not present

## 2021-03-22 DIAGNOSIS — H6123 Impacted cerumen, bilateral: Secondary | ICD-10-CM | POA: Diagnosis not present

## 2021-03-22 DIAGNOSIS — G510 Bell's palsy: Secondary | ICD-10-CM | POA: Diagnosis not present

## 2021-03-22 DIAGNOSIS — H81399 Other peripheral vertigo, unspecified ear: Secondary | ICD-10-CM | POA: Diagnosis not present

## 2021-03-22 DIAGNOSIS — H903 Sensorineural hearing loss, bilateral: Secondary | ICD-10-CM | POA: Diagnosis not present

## 2021-03-23 DIAGNOSIS — R42 Dizziness and giddiness: Secondary | ICD-10-CM | POA: Diagnosis not present

## 2021-03-23 DIAGNOSIS — R262 Difficulty in walking, not elsewhere classified: Secondary | ICD-10-CM | POA: Diagnosis not present

## 2021-03-23 DIAGNOSIS — R531 Weakness: Secondary | ICD-10-CM | POA: Diagnosis not present

## 2021-03-23 DIAGNOSIS — M25551 Pain in right hip: Secondary | ICD-10-CM | POA: Diagnosis not present

## 2021-03-25 DIAGNOSIS — R262 Difficulty in walking, not elsewhere classified: Secondary | ICD-10-CM | POA: Diagnosis not present

## 2021-03-25 DIAGNOSIS — M25551 Pain in right hip: Secondary | ICD-10-CM | POA: Diagnosis not present

## 2021-03-25 DIAGNOSIS — R531 Weakness: Secondary | ICD-10-CM | POA: Diagnosis not present

## 2021-03-25 DIAGNOSIS — R42 Dizziness and giddiness: Secondary | ICD-10-CM | POA: Diagnosis not present

## 2021-03-28 DIAGNOSIS — R531 Weakness: Secondary | ICD-10-CM | POA: Diagnosis not present

## 2021-03-28 DIAGNOSIS — M25551 Pain in right hip: Secondary | ICD-10-CM | POA: Diagnosis not present

## 2021-03-28 DIAGNOSIS — R262 Difficulty in walking, not elsewhere classified: Secondary | ICD-10-CM | POA: Diagnosis not present

## 2021-03-28 DIAGNOSIS — R42 Dizziness and giddiness: Secondary | ICD-10-CM | POA: Diagnosis not present

## 2021-03-30 DIAGNOSIS — R262 Difficulty in walking, not elsewhere classified: Secondary | ICD-10-CM | POA: Diagnosis not present

## 2021-03-30 DIAGNOSIS — R531 Weakness: Secondary | ICD-10-CM | POA: Diagnosis not present

## 2021-03-30 DIAGNOSIS — M25551 Pain in right hip: Secondary | ICD-10-CM | POA: Diagnosis not present

## 2021-03-30 DIAGNOSIS — R42 Dizziness and giddiness: Secondary | ICD-10-CM | POA: Diagnosis not present

## 2021-04-07 DIAGNOSIS — M25551 Pain in right hip: Secondary | ICD-10-CM | POA: Diagnosis not present

## 2021-04-07 DIAGNOSIS — R531 Weakness: Secondary | ICD-10-CM | POA: Diagnosis not present

## 2021-04-07 DIAGNOSIS — R262 Difficulty in walking, not elsewhere classified: Secondary | ICD-10-CM | POA: Diagnosis not present

## 2021-04-07 DIAGNOSIS — R42 Dizziness and giddiness: Secondary | ICD-10-CM | POA: Diagnosis not present

## 2021-04-12 DIAGNOSIS — H8111 Benign paroxysmal vertigo, right ear: Secondary | ICD-10-CM | POA: Diagnosis not present

## 2021-04-15 DIAGNOSIS — R531 Weakness: Secondary | ICD-10-CM | POA: Diagnosis not present

## 2021-04-15 DIAGNOSIS — R262 Difficulty in walking, not elsewhere classified: Secondary | ICD-10-CM | POA: Diagnosis not present

## 2021-04-15 DIAGNOSIS — M25551 Pain in right hip: Secondary | ICD-10-CM | POA: Diagnosis not present

## 2021-04-15 DIAGNOSIS — R42 Dizziness and giddiness: Secondary | ICD-10-CM | POA: Diagnosis not present

## 2021-04-19 DIAGNOSIS — R42 Dizziness and giddiness: Secondary | ICD-10-CM | POA: Diagnosis not present

## 2021-04-19 DIAGNOSIS — G510 Bell's palsy: Secondary | ICD-10-CM | POA: Insufficient documentation

## 2021-04-19 DIAGNOSIS — D432 Neoplasm of uncertain behavior of brain, unspecified: Secondary | ICD-10-CM | POA: Diagnosis not present

## 2021-04-19 DIAGNOSIS — R262 Difficulty in walking, not elsewhere classified: Secondary | ICD-10-CM | POA: Insufficient documentation

## 2021-04-20 DIAGNOSIS — H8111 Benign paroxysmal vertigo, right ear: Secondary | ICD-10-CM | POA: Diagnosis not present

## 2021-04-22 DIAGNOSIS — M25551 Pain in right hip: Secondary | ICD-10-CM | POA: Diagnosis not present

## 2021-04-22 DIAGNOSIS — R531 Weakness: Secondary | ICD-10-CM | POA: Diagnosis not present

## 2021-04-22 DIAGNOSIS — R42 Dizziness and giddiness: Secondary | ICD-10-CM | POA: Diagnosis not present

## 2021-04-22 DIAGNOSIS — R262 Difficulty in walking, not elsewhere classified: Secondary | ICD-10-CM | POA: Diagnosis not present

## 2021-04-27 DIAGNOSIS — H8111 Benign paroxysmal vertigo, right ear: Secondary | ICD-10-CM | POA: Diagnosis not present

## 2021-05-09 ENCOUNTER — Other Ambulatory Visit: Payer: Self-pay

## 2021-05-09 ENCOUNTER — Emergency Department: Payer: BC Managed Care – PPO

## 2021-05-09 ENCOUNTER — Inpatient Hospital Stay
Admission: EM | Admit: 2021-05-09 | Discharge: 2021-05-12 | DRG: 247 | Disposition: A | Discharge status: Home / Self Care | Payer: BC Managed Care – PPO | Attending: Internal Medicine | Admitting: Internal Medicine

## 2021-05-09 DIAGNOSIS — Z880 Allergy status to penicillin: Secondary | ICD-10-CM | POA: Diagnosis not present

## 2021-05-09 DIAGNOSIS — Z6830 Body mass index (BMI) 30.0-30.9, adult: Secondary | ICD-10-CM | POA: Diagnosis not present

## 2021-05-09 DIAGNOSIS — I200 Unstable angina: Secondary | ICD-10-CM | POA: Diagnosis present

## 2021-05-09 DIAGNOSIS — I252 Old myocardial infarction: Secondary | ICD-10-CM

## 2021-05-09 DIAGNOSIS — N1831 Chronic kidney disease, stage 3a: Secondary | ICD-10-CM | POA: Diagnosis present

## 2021-05-09 DIAGNOSIS — E785 Hyperlipidemia, unspecified: Secondary | ICD-10-CM | POA: Diagnosis not present

## 2021-05-09 DIAGNOSIS — I214 Non-ST elevation (NSTEMI) myocardial infarction: Principal | ICD-10-CM | POA: Diagnosis present

## 2021-05-09 DIAGNOSIS — Z955 Presence of coronary angioplasty implant and graft: Secondary | ICD-10-CM

## 2021-05-09 DIAGNOSIS — R42 Dizziness and giddiness: Secondary | ICD-10-CM | POA: Diagnosis present

## 2021-05-09 DIAGNOSIS — B20 Human immunodeficiency virus [HIV] disease: Secondary | ICD-10-CM | POA: Diagnosis present

## 2021-05-09 DIAGNOSIS — I129 Hypertensive chronic kidney disease with stage 1 through stage 4 chronic kidney disease, or unspecified chronic kidney disease: Secondary | ICD-10-CM | POA: Diagnosis present

## 2021-05-09 DIAGNOSIS — R079 Chest pain, unspecified: Secondary | ICD-10-CM | POA: Diagnosis not present

## 2021-05-09 DIAGNOSIS — H548 Legal blindness, as defined in USA: Secondary | ICD-10-CM | POA: Diagnosis not present

## 2021-05-09 DIAGNOSIS — E669 Obesity, unspecified: Secondary | ICD-10-CM | POA: Diagnosis not present

## 2021-05-09 DIAGNOSIS — I10 Essential (primary) hypertension: Secondary | ICD-10-CM | POA: Diagnosis present

## 2021-05-09 DIAGNOSIS — I249 Acute ischemic heart disease, unspecified: Secondary | ICD-10-CM | POA: Diagnosis present

## 2021-05-09 DIAGNOSIS — F419 Anxiety disorder, unspecified: Secondary | ICD-10-CM | POA: Diagnosis not present

## 2021-05-09 DIAGNOSIS — Z85828 Personal history of other malignant neoplasm of skin: Secondary | ICD-10-CM | POA: Diagnosis not present

## 2021-05-09 DIAGNOSIS — E782 Mixed hyperlipidemia: Secondary | ICD-10-CM | POA: Diagnosis present

## 2021-05-09 DIAGNOSIS — R0789 Other chest pain: Secondary | ICD-10-CM | POA: Diagnosis not present

## 2021-05-09 DIAGNOSIS — R110 Nausea: Secondary | ICD-10-CM | POA: Diagnosis not present

## 2021-05-09 DIAGNOSIS — Z91041 Radiographic dye allergy status: Secondary | ICD-10-CM

## 2021-05-09 DIAGNOSIS — Z8249 Family history of ischemic heart disease and other diseases of the circulatory system: Secondary | ICD-10-CM

## 2021-05-09 DIAGNOSIS — K219 Gastro-esophageal reflux disease without esophagitis: Secondary | ICD-10-CM | POA: Diagnosis present

## 2021-05-09 DIAGNOSIS — I25110 Atherosclerotic heart disease of native coronary artery with unstable angina pectoris: Secondary | ICD-10-CM | POA: Diagnosis not present

## 2021-05-09 DIAGNOSIS — Z7982 Long term (current) use of aspirin: Secondary | ICD-10-CM | POA: Diagnosis not present

## 2021-05-09 DIAGNOSIS — Z79899 Other long term (current) drug therapy: Secondary | ICD-10-CM | POA: Diagnosis not present

## 2021-05-09 DIAGNOSIS — Z20822 Contact with and (suspected) exposure to covid-19: Secondary | ICD-10-CM | POA: Diagnosis present

## 2021-05-09 DIAGNOSIS — I248 Other forms of acute ischemic heart disease: Secondary | ICD-10-CM | POA: Diagnosis not present

## 2021-05-09 DIAGNOSIS — I2510 Atherosclerotic heart disease of native coronary artery without angina pectoris: Secondary | ICD-10-CM | POA: Diagnosis not present

## 2021-05-09 DIAGNOSIS — G609 Hereditary and idiopathic neuropathy, unspecified: Secondary | ICD-10-CM | POA: Diagnosis present

## 2021-05-09 HISTORY — DX: Dizziness and giddiness: R42

## 2021-05-09 HISTORY — DX: Allergic rhinitis, unspecified: J30.9

## 2021-05-09 HISTORY — DX: Squamous cell carcinoma of skin, unspecified: C44.92

## 2021-05-09 HISTORY — DX: Atherosclerotic heart disease of native coronary artery without angina pectoris: I25.10

## 2021-05-09 HISTORY — DX: Other ill-defined heart diseases: I51.89

## 2021-05-09 HISTORY — DX: Anxiety disorder, unspecified: F41.9

## 2021-05-09 HISTORY — DX: Gastro-esophageal reflux disease without esophagitis: K21.9

## 2021-05-09 HISTORY — DX: Difficulty in walking, not elsewhere classified: R26.2

## 2021-05-09 HISTORY — DX: Congenital cataract: Q12.0

## 2021-05-09 HISTORY — DX: Mixed hyperlipidemia: E78.2

## 2021-05-09 LAB — BASIC METABOLIC PANEL
Anion gap: 6 (ref 5–15)
BUN: 13 mg/dL (ref 8–23)
CO2: 24 mmol/L (ref 22–32)
Calcium: 8.8 mg/dL — ABNORMAL LOW (ref 8.9–10.3)
Chloride: 105 mmol/L (ref 98–111)
Creatinine, Ser: 1.15 mg/dL (ref 0.61–1.24)
GFR, Estimated: >60 mL/min (ref >60)
Glucose, Bld: 127 mg/dL — ABNORMAL HIGH (ref 70–99)
Potassium: 3.7 mmol/L (ref 3.5–5.1)
Sodium: 135 mmol/L (ref 135–145)

## 2021-05-09 LAB — CBC
HCT: 44.3 % (ref 39.0–52.0)
Hemoglobin: 14.4 g/dL (ref 13.0–17.0)
MCH: 25.9 pg — ABNORMAL LOW (ref 26.0–34.0)
MCHC: 32.5 g/dL (ref 30.0–36.0)
MCV: 79.7 fL — ABNORMAL LOW (ref 80.0–100.0)
Platelets: 378 10*3/uL (ref 150–400)
RBC: 5.56 MIL/uL (ref 4.22–5.81)
RDW: 13 % (ref 11.5–15.5)
WBC: 8.9 10*3/uL (ref 4.0–10.5)
nRBC: 0 % (ref 0.0–0.2)

## 2021-05-09 LAB — MAGNESIUM: Magnesium: 1.9 mg/dL (ref 1.7–2.4)

## 2021-05-09 LAB — RESP PANEL BY RT-PCR (FLU A&B, COVID) ARPGX2
Influenza A by PCR: NEGATIVE
Influenza B by PCR: NEGATIVE
SARS Coronavirus 2 by RT PCR: NEGATIVE

## 2021-05-09 LAB — TROPONIN I (HIGH SENSITIVITY)
Troponin I (High Sensitivity): 80 ng/L — ABNORMAL HIGH (ref <18)
Troponin I (High Sensitivity): 417 ng/L (ref <18)
Troponin I (High Sensitivity): 412 ng/L (ref <18)

## 2021-05-09 IMAGING — CR DG CHEST 2V
1 series · 2 of 2 positions shown · non-contrast
Comparison: [DATE].

CLINICAL DATA: Chest pain.

EXAM:
CHEST - 2 VIEW

[Series 1: dg chest 2 view · 0.14mm/px · 2 of 2 slices shown]
[im 1/2]
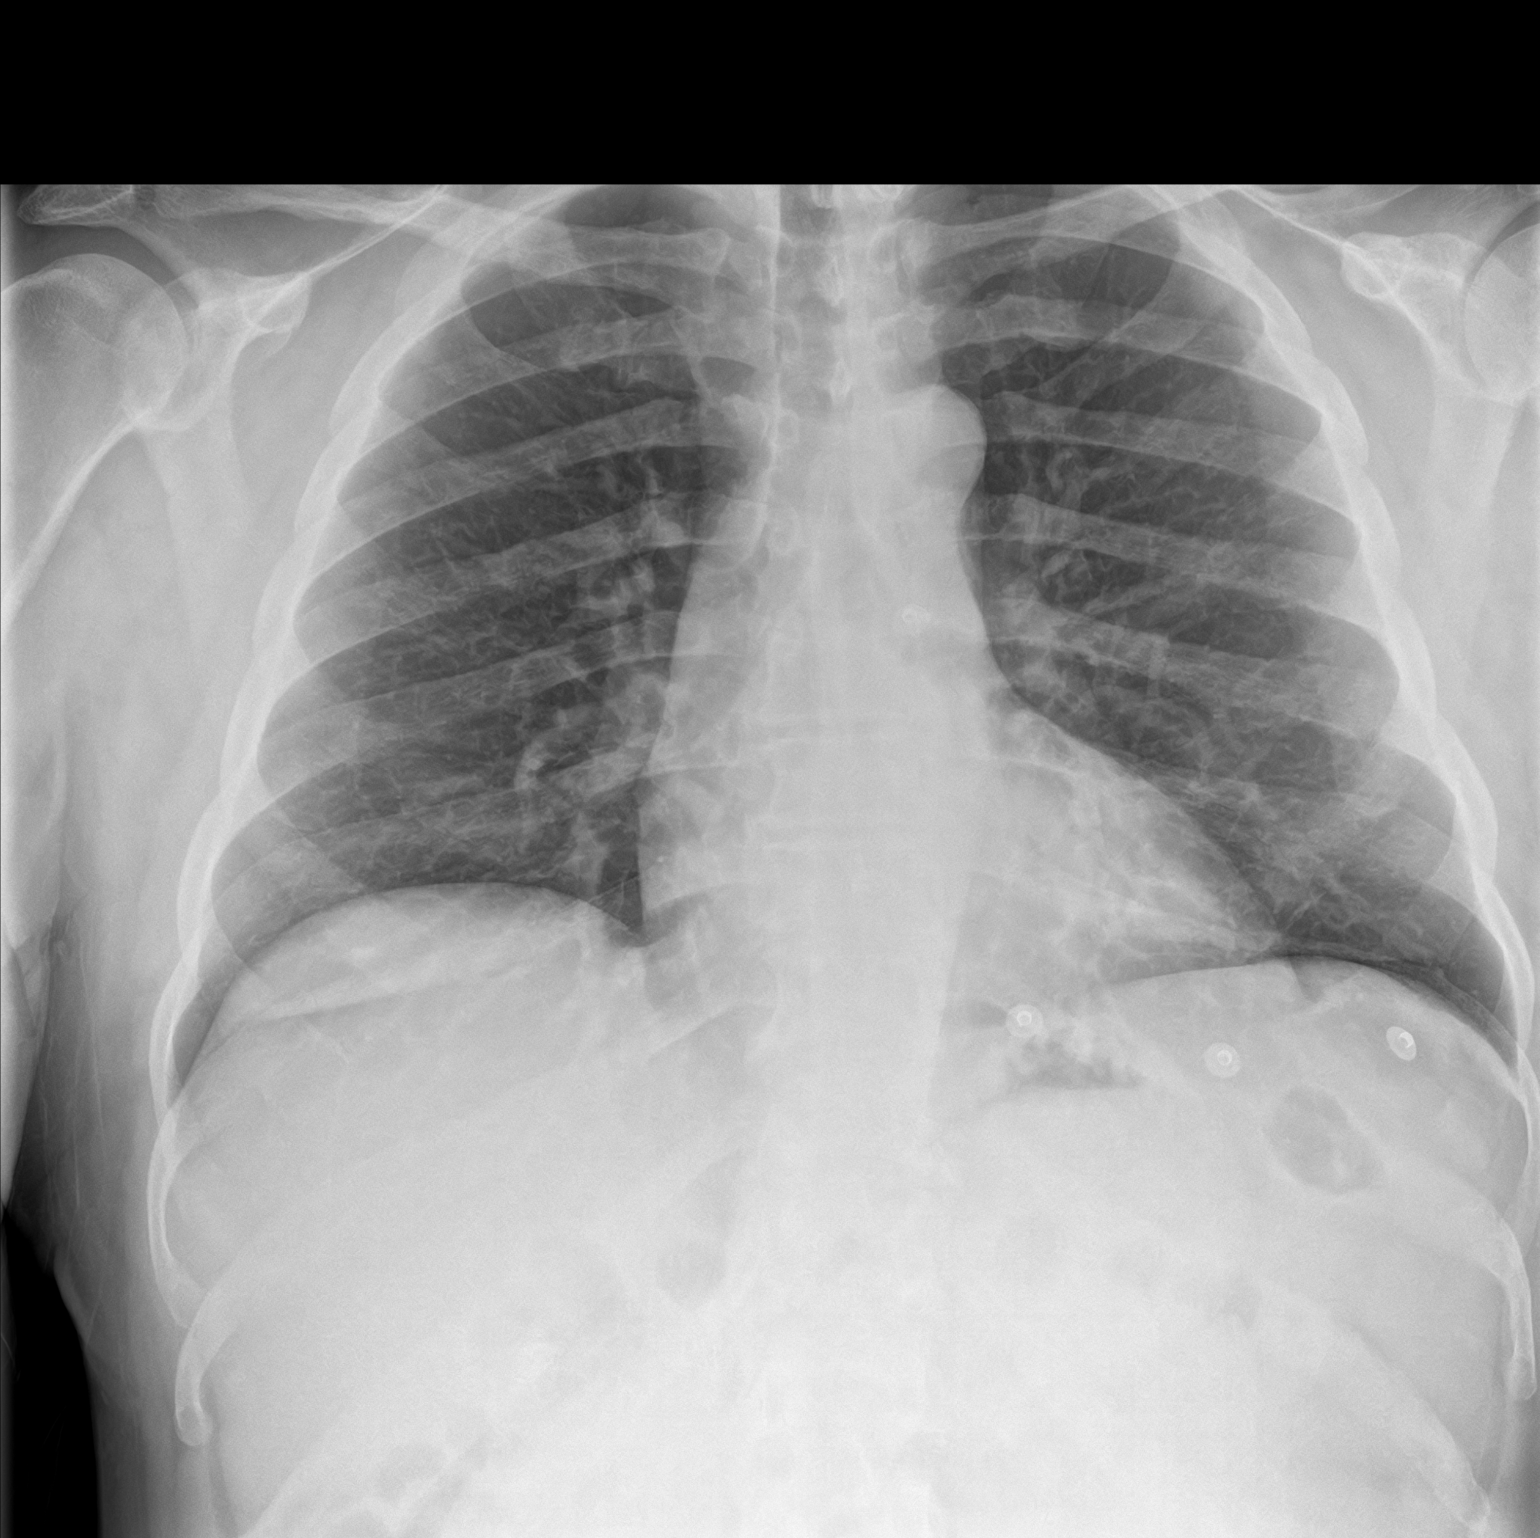
[im 2/2]
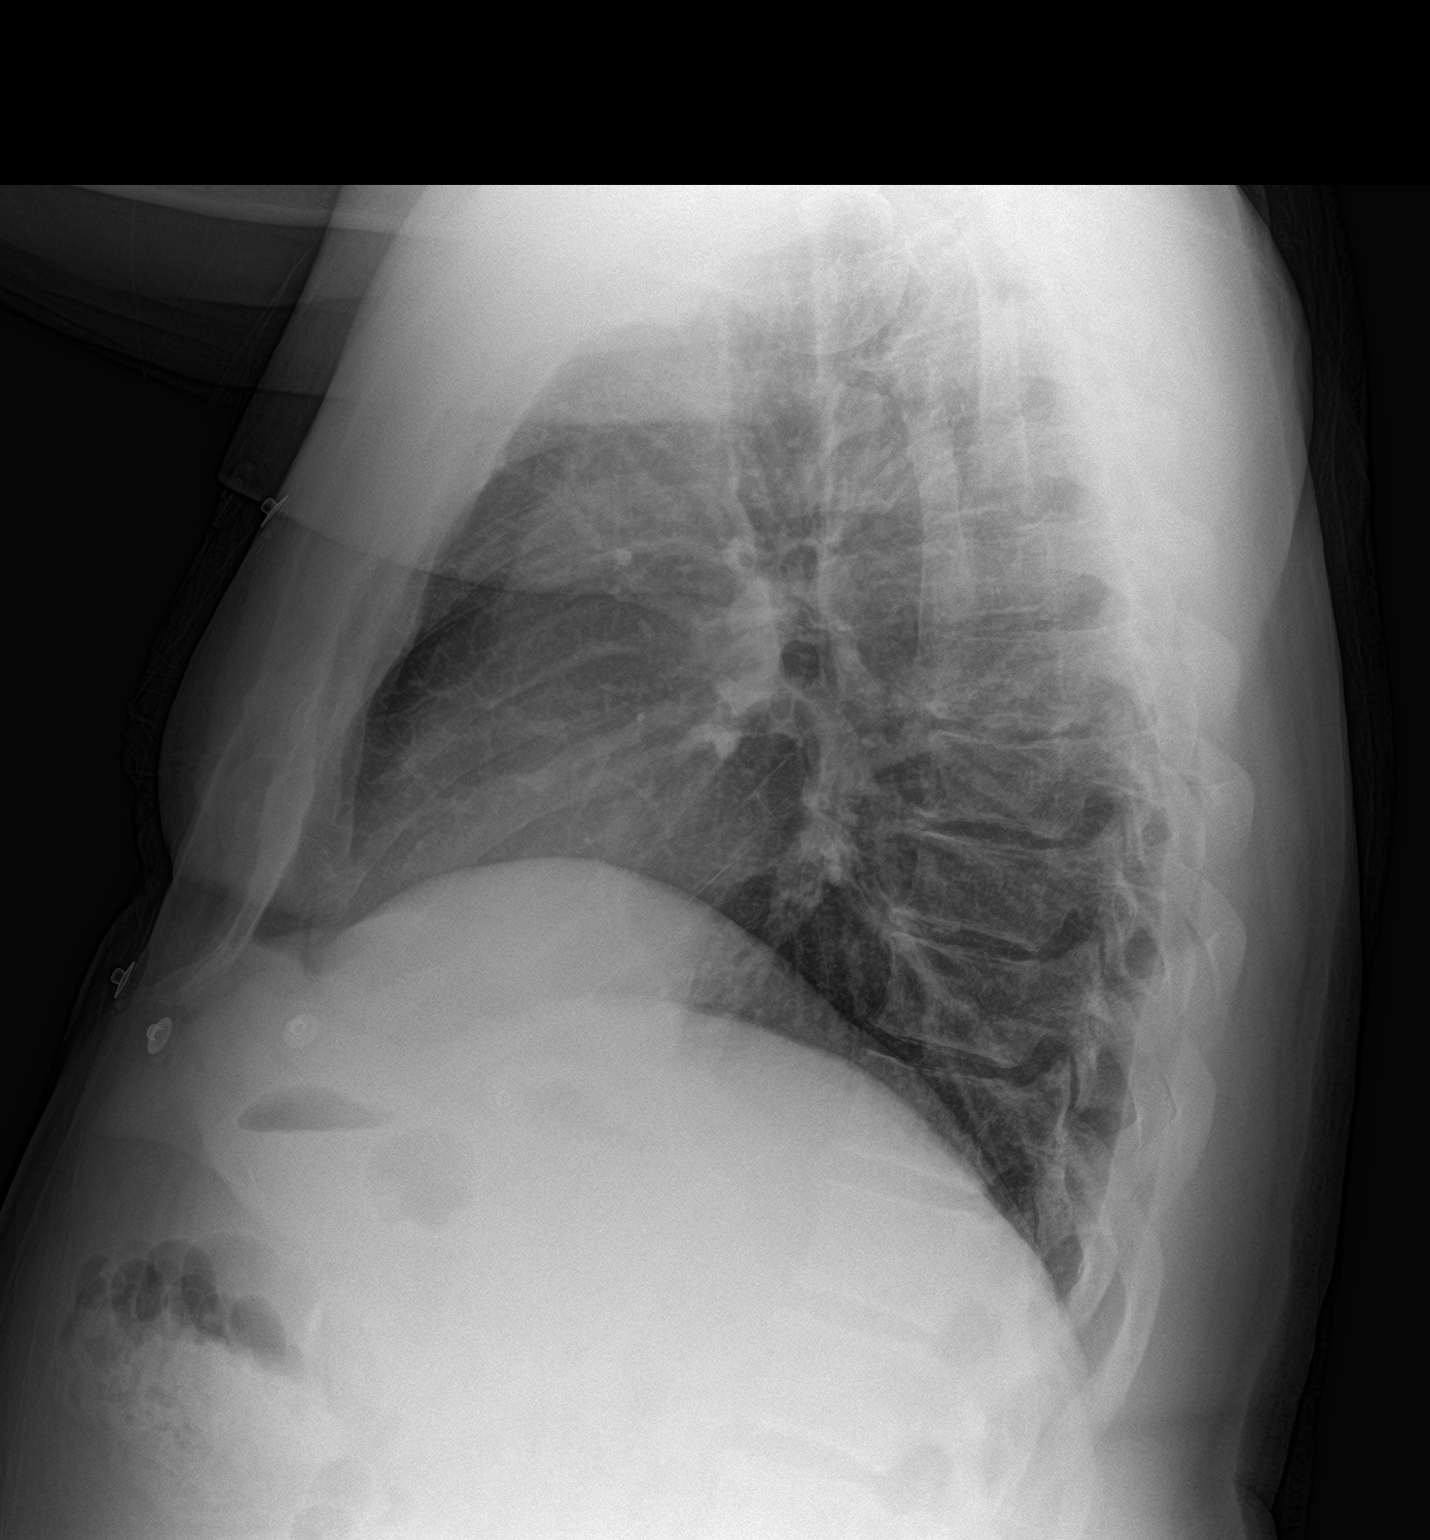

[2 of 2 positions shown; findings below may reference images not displayed]

FINDINGS: The heart size and mediastinal contours are within normal limits.
Both lungs are clear. The visualized skeletal structures are
unremarkable.
IMPRESSION: No active cardiopulmonary disease.

## 2021-05-09 MED ORDER — NITROGLYCERIN 0.4 MG SL SUBL: 0.4000 mg | SUBLINGUAL_TABLET | SUBLINGUAL | Status: DC | PRN

## 2021-05-09 MED ORDER — ONDANSETRON HCL 4 MG PO TABS: 4.0000 mg | ORAL_TABLET | Freq: Four times a day (QID) | ORAL | Status: DC | PRN

## 2021-05-09 MED ORDER — BISACODYL 5 MG PO TBEC: 5.0000 mg | DELAYED_RELEASE_TABLET | Freq: Every day | ORAL | Status: DC | PRN

## 2021-05-09 MED ORDER — ASPIRIN 300 MG RE SUPP: 300.0000 mg | RECTAL | Status: AC

## 2021-05-09 MED ORDER — NORTRIPTYLINE HCL 10 MG PO CAPS
20.0000 mg | ORAL_CAPSULE | Freq: Every day | ORAL | Status: DC
  Administered 2021-05-10 – 2021-05-11 (×2): 20 mg via ORAL
  Filled 2021-05-09 (×5): qty 2

## 2021-05-09 MED ORDER — METOPROLOL TARTRATE 50 MG PO TABS
50.0000 mg | ORAL_TABLET | Freq: Two times a day (BID) | ORAL | Status: DC
  Administered 2021-05-09 – 2021-05-12 (×6): 50 mg via ORAL
  Filled 2021-05-09 (×6): qty 1

## 2021-05-09 MED ORDER — MORPHINE SULFATE (PF) 2 MG/ML IV SOLN
2.0000 mg | INTRAVENOUS | Status: DC | PRN
Start: 2021-05-09 — End: 2021-05-12

## 2021-05-09 MED ORDER — ASPIRIN 81 MG PO CHEW
81.0000 mg | CHEWABLE_TABLET | Freq: Every day | ORAL | Status: DC
  Administered 2021-05-10 – 2021-05-12 (×3): 81 mg via ORAL
  Filled 2021-05-09 (×3): qty 1

## 2021-05-09 MED ORDER — ACETAMINOPHEN 325 MG PO TABS: 650.0000 mg | ORAL_TABLET | Freq: Four times a day (QID) | ORAL | Status: DC | PRN

## 2021-05-09 MED ORDER — SENNOSIDES-DOCUSATE SODIUM 8.6-50 MG PO TABS: 1.0000 | ORAL_TABLET | Freq: Every evening | ORAL | Status: DC | PRN

## 2021-05-09 MED ORDER — EFAVIRENZ-EMTRICITAB-TENOFOVIR 600-200-300 MG PO TABS
1.0000 | ORAL_TABLET | Freq: Every day | ORAL | Status: DC
  Administered 2021-05-09 – 2021-05-11 (×3): 1 via ORAL
  Filled 2021-05-09 (×5): qty 1

## 2021-05-09 MED ORDER — HYDROCODONE-ACETAMINOPHEN 5-325 MG PO TABS: 1.0000 | ORAL_TABLET | ORAL | Status: DC | PRN

## 2021-05-09 MED ORDER — ATORVASTATIN CALCIUM 20 MG PO TABS
20.0000 mg | ORAL_TABLET | Freq: Every day | ORAL | Status: DC
  Administered 2021-05-09 – 2021-05-11 (×3): 20 mg via ORAL
  Filled 2021-05-09 (×4): qty 1

## 2021-05-09 MED ORDER — ASPIRIN 81 MG PO CHEW: 324.0000 mg | CHEWABLE_TABLET | ORAL | Status: AC

## 2021-05-09 MED ORDER — ENOXAPARIN SODIUM 100 MG/ML IJ SOSY
100.0000 mg | PREFILLED_SYRINGE | Freq: Two times a day (BID) | INTRAMUSCULAR | Status: DC
  Administered 2021-05-09 – 2021-05-10 (×2): 100 mg via SUBCUTANEOUS
  Filled 2021-05-09 (×4): qty 1

## 2021-05-09 MED ORDER — ONDANSETRON HCL 4 MG/2ML IJ SOLN: 4.0000 mg | Freq: Four times a day (QID) | INTRAMUSCULAR | Status: DC | PRN

## 2021-05-09 MED ORDER — FOLIC ACID 1 MG PO TABS
1.0000 mg | ORAL_TABLET | Freq: Every day | ORAL | Status: DC
  Administered 2021-05-10 – 2021-05-12 (×3): 1 mg via ORAL
  Filled 2021-05-09 (×3): qty 1

## 2021-05-09 MED ORDER — LISINOPRIL-HYDROCHLOROTHIAZIDE 20-12.5 MG PO TABS: 2.0000 | ORAL_TABLET | Freq: Every day | ORAL | Status: DC

## 2021-05-09 MED ORDER — LORAZEPAM 1 MG PO TABS: 1.0000 mg | ORAL_TABLET | Freq: Three times a day (TID) | ORAL | Status: DC | PRN

## 2021-05-09 MED ORDER — HYDROCHLOROTHIAZIDE 25 MG PO TABS
25.0000 mg | ORAL_TABLET | Freq: Every day | ORAL | Status: DC
  Administered 2021-05-10 – 2021-05-12 (×3): 25 mg via ORAL
  Filled 2021-05-09 (×3): qty 1

## 2021-05-09 MED ORDER — PANTOPRAZOLE SODIUM 40 MG PO TBEC
40.0000 mg | DELAYED_RELEASE_TABLET | Freq: Every day | ORAL | Status: DC
Start: 2021-05-09 — End: 2021-05-12
  Administered 2021-05-09 – 2021-05-11 (×3): 40 mg via ORAL
  Filled 2021-05-09 (×4): qty 1

## 2021-05-09 MED ORDER — VORTIOXETINE HBR 5 MG PO TABS
20.0000 mg | ORAL_TABLET | Freq: Every day | ORAL | Status: DC
  Administered 2021-05-10 – 2021-05-12 (×3): 20 mg via ORAL
  Filled 2021-05-09 (×3): qty 4

## 2021-05-09 MED ORDER — AMLODIPINE BESYLATE 5 MG PO TABS
5.0000 mg | ORAL_TABLET | Freq: Every day | ORAL | Status: DC
  Administered 2021-05-10 – 2021-05-12 (×3): 5 mg via ORAL
  Filled 2021-05-09 (×3): qty 1

## 2021-05-09 MED ORDER — METOPROLOL TARTRATE 5 MG/5ML IV SOLN: 5.0000 mg | Freq: Four times a day (QID) | INTRAVENOUS | Status: DC | PRN

## 2021-05-09 MED ORDER — ENOXAPARIN SODIUM 40 MG/0.4ML IJ SOSY: 40.0000 mg | PREFILLED_SYRINGE | INTRAMUSCULAR | Status: DC

## 2021-05-09 MED ORDER — ACETAMINOPHEN 650 MG RE SUPP
650.0000 mg | Freq: Four times a day (QID) | RECTAL | Status: DC | PRN
  Filled 2021-05-09: qty 1

## 2021-05-09 MED ORDER — MECLIZINE HCL 25 MG PO TABS
25.0000 mg | ORAL_TABLET | Freq: Three times a day (TID) | ORAL | Status: DC | PRN
  Administered 2021-05-09: 25 mg via ORAL
  Filled 2021-05-09 (×3): qty 1

## 2021-05-09 MED ORDER — LISINOPRIL 20 MG PO TABS
40.0000 mg | ORAL_TABLET | Freq: Every day | ORAL | Status: DC
  Administered 2021-05-10 – 2021-05-12 (×3): 40 mg via ORAL
  Filled 2021-05-09: qty 4
  Filled 2021-05-09 (×2): qty 2

## 2021-05-09 NOTE — H&P (Signed)
History and Physical    [name] LKG:[id number] DOB: 06/22/1961 DOA: 05/09/2021  PCP: Gladstone Lighter, MD Previously Dr. Delena Bali in [place], [place]  Cardiologist:  NONE  Patient coming from: Home   I have personally briefly reviewed patient's old medical records in [facility].  Chief Complaint: chest pain  HPI: Christopher Hart. is a 60 y.o. male with medical history significant for coronary artery disease (NSTEMI and cardiac cath 06/13/17 but no stents placed and is not followed by a cardiologist), HIV, hypertension, hyperlipidemia, Bell's palsy with residual facial droop, GERD, chronic difficulty walking, who presents to the emergency department on 05/09/2021 with chest pain.  Patient reports left shoulder/elbow pain with exertion intermittently over the 3-4 weeks prior to admission but then developed overwhelming chest pain on the day of admission when he was walking his dog. Normally the pain crescendos then "pops" and stops. On the day of admission pain was in the left chest and substernal area, radiated to left shoulder, was up to 10/10 at times and was characterized as heaviness and pressure "like there were cinder blocks on it." He said, "It felt like there were 2 hands pulling on my heart in opposite directions." Symptoms are alleviated by nothing and exacerbated by walking/exertion. Associated symptoms: Profuse diaphoresis with chest pain. Shortness of breath. Nausea, drooling (happens before he vomits), but did not actually vomit. Subjective fever, chills, cough, and wheezing x several days. Had periumbilical abdominal pain, intermittent x 2 days, that was characterized as burning/indigestion. No other abdominal symptoms. Had episode of the periumbical abdominal burning with associated left arm numbness feeling. Generalized weakness/ fatigue. Uses a cane at baseline.  Patient's chest pain was severe so EMS was called.  Pain had decreased upon arrival in the emergency  department; he had 2 slight episodes of chest pain in the waiting room but none since then.   ED Course: SARS-CoV-2 PCR and influenza testing were all negative.  Initial troponin 80 with second troponin 417.  Chest x-ray showed no acute process.   History:  Cardiac catheterization, 06/13/2017, Dr. Rockey Situ: Summary: Mid Cx lesion is 100% stenosed. Prox RCA lesion is 30% stenosed. Dist RCA lesion is 40% stenosed. Prox LAD-1 lesion is 30% stenosed. Post intervention, there is a -1% residual stenosis. Prox LAD-2 lesion is 30% stenosed. Dist LAD lesion is 40% stenosed. Final Conclusions:   Culprit lesion likely distal circumflex which appears occluded There is also small diffusely diseased vessel coming off a ramus/high OM Otherwise nonobstructive disease noted in LAD and RCA Medical management recommended Recommendations:  Aspirin and Plavix, High-dose statin Stay on lisinopril and metoprolol  follow-up in clinic   Echocardiogram 2018: Study Conclusions  - Left ventricle: The cavity size was normal. There was mild    concentric hypertrophy. Systolic function was normal. The    estimated ejection fraction was in the range of 60% to 65%. Wall    motion was normal; there were no regional wall motion    abnormalities. Left ventricular diastolic function parameters    were normal.  - Mitral valve: There was mild regurgitation.  - Left atrium: The atrium was mildly dilated.  - Right ventricle: Systolic function was normal.  - Pulmonary arteries: Systolic pressure was within the normal    range.  _____________   Review of Systems: As per HPI otherwise all other systems reviewed and are unremarable. NEUROLOGICAL: No headache or new focal weakness. Does note generalized weakness. Left arm numbness.   Past Medical History:  Diagnosis Date   Allergic rhinitis    Anxiety    BCE (basal cell epithelioma)    Has had 23 basal cell skin cancer lesions   CAD (coronary artery disease)     Cath 06/13/17: Mid Cx 100% stenosed. 40% stenosis of dist RCA, distal LAD. 30% stenosis prox RCA, prox LAD. Unable to stent.   Congenital cataract of both eyes    Initially legally blind, but sight improved with cataract removal   Difficulty walking    Idiopathic peripheral neuropathy.  Followed by neurologist.  EMG 01/20/2021: Abnormal.  Sensorimotor peripheral neuropathy with axonal features.  No conduction blocks.  No evidence of myopathy.   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   GERD (gastroesophageal reflux disease)    HIV infection (HCC)    Low viral load. Takes medication. Managed by primary care.   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 Sees neurologist, Dr. Melrose Nakayama.   Vertigo    CAD: Patient reports he had the cardiac catheterization and was told no intervention was possible at the occluded lesion.  He never followed up as an outpatient with a cardiologist.   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 He has never used smokeless tobacco. He reports current alcohol use. He reports that he does not use drugs.  Allergies  Allergen Reactions   Penicillins     Whelps    Family History  Problem Relation Age of Onset   CAD Mother        a. MI at age 44   CAD Maternal Grandmother    CAD  Paternal Grandmother      Home Medications  Prior to Admission medications   Medication Sig Start Date End Date Taking? Authorizing Provider  ALPRAZolam Duanne Moron) 1 MG tablet Take 1 mg by mouth 2 (two) times daily. 10/15/20  Yes [provider]  amLODipine (NORVASC) 5 MG tablet Take 1 tablet by mouth daily.   Yes [provider]  aspirin 81 MG chewable tablet Chew 1 tablet (81 mg total) daily by mouth. 06/14/17  Yes Max Sane, MD  efavirenz-emtricitabine-tenofovir (ATRIPLA) 600-200-300 MG tablet Take 1 tablet by mouth at bedtime.   Yes [provider]  folic acid (FOLVITE) 1 MG tablet Take 1 tablet by mouth daily.   Yes [provider]  lisinopril-hydrochlorothiazide (ZESTORETIC) 20-12.5 MG tablet Take 2 tablets by mouth daily. 02/12/21  Yes [provider]  LORazepam (ATIVAN) 1 MG tablet Take 1 tablet by mouth every 8 (eight) hours as needed.   Yes [provider]  meclizine (ANTIVERT) 25 MG tablet Take 1 tablet (25 mg total) by mouth 3 (three) times daily as needed for dizziness or nausea. 01/09/21  Yes Carrie Mew, MD  metoprolol tartrate (LOPRESSOR) 50 MG tablet Take 50  mg by mouth 2 (two) times daily. 01/12/21  Yes [provider]  nortriptyline (PAMELOR) 10 MG capsule Take 20 capsules by mouth at bedtime. 04/19/21  Yes [provider]  omeprazole (PRILOSEC) 20 MG capsule Take 20 mg by mouth daily. 10/17/18  Yes [provider]  ondansetron (ZOFRAN ODT) 4 MG disintegrating tablet Take 1 tablet (4 mg total) by mouth every 8 (eight) hours as needed for nausea or vomiting. 01/09/21  Yes Carrie Mew, MD  TRINTELLIX 20 MG TABS tablet Take 20 mg by mouth daily. 09/09/20  Yes [provider]  atorvastatin (LIPITOR) 40 MG tablet Take 0.5 tablets (20 mg total) daily by mouth. Patient not taking: No sig reported 06/13/17   Max Sane, MD    Physical Exam: Vitals:   05/09/21 1310 05/09/21 1822 05/09/21 1917   BP: 132/87 138/87 132/90  Pulse: 74 68 64  Resp: 18 20 16   Temp: 98 F (36.7 C)  98 F (36.7 C)  TempSrc:   Oral  SpO2: 98% 100% 99%    Constitutional: NAD, calm, ill-appearing. Vitals:   05/09/21 1310 05/09/21 1822 05/09/21 1917  BP: 132/87 138/87 132/90  Pulse: 74 68 64  Resp: 18 20 16   Temp: 98 F (36.7 C)  98 F (36.7 C)  TempSrc:   Oral  SpO2: 98% 100% 99%   Eyes: Pupils equal and round, lids and conjunctivae without icterus or erythema. ENMT: Mucous membranes are moist. Posterior pharynx clear of any exudate or lesions. Nares patent without discharge or bleeding.  Normocephalic, atraumatic.   Neck: normal, supple, no masses, trachea midline.  Thyroid nontender, no masses appreciated, no thyromegaly. Respiratory: clear to auscultation bilaterally. Chest wall movements are symmetric. No wheezing, no crackles.  No rhonchi.  Normal respiratory effort. No accessory muscle use.  Cardiovascular: Regular rate and rhythm, no murmurs / rubs / gallops. Pulses: DP pulses 2+ bilaterally. No carotid bruits.  Capillary refill less than 3 seconds. Edema: None bilaterally. GI: soft, non-distended, normal active bowel sounds. No hepatosplenomegaly. No rigidity, rebound, or guarding. Non-tender. No masses palpated.  No CVA tenderness bilaterally. Musculoskeletal: no clubbing / cyanosis. No joint deformity upper and lower extremities. Good ROM, no contractures. Normal muscle tone.  No tenderness or deformity in the back bilaterally. Integument: no rashes, lesions, ulcers. No induration. Clean, dry, intact. Neurologic: CN 2-12 grossly intact, except slight right-sided facial droop (is chronic). Sensation grossly intact to light touch. DTR 2+ bilaterally.  Babinski: Toes downgoing bilaterally.  Strength 4/5 in all 4.  Intact rapid alternating movements bilaterally.  No pronator drift. Psychiatric: Normal judgment and insight. Alert and oriented x 3. Normal mood.  Normal and appropriate  affect. Lymphatic: No cervical lymphadenopathy. No supraclavicular lymphadenopathy.   Labs on Admission: I have personally reviewed the following labs and imaging studies.  CBC: Recent Labs  Lab 05/09/21 1243  WBC 8.9  HGB 14.4  HCT 44.3  MCV 79.7*  PLT 102    Basic Metabolic Panel: Recent Labs  Lab 05/09/21 1243  NA 135  K 3.7  CL 105  CO2 24  GLUCOSE 127*  BUN 13  CREATININE 1.15  CALCIUM 8.8*    GFR: CrCl cannot be calculated (Unknown ideal weight.).   Radiological Exams on Admission: DG Chest 2 View  Result Date: 05/09/2021 CLINICAL DATA:  Chest pain. EXAM: CHEST - 2 VIEW COMPARISON:  January 09, 2021. FINDINGS: The heart size and mediastinal contours are within normal limits. Both lungs are clear. The visualized skeletal structures are  unremarkable. IMPRESSION: No active cardiopulmonary disease. Electronically Signed   By: Marijo Conception M.D.   On: 05/09/2021 14:16    EKG: Independently reviewed.  74 bpm.  Normal sinus rhythm.    Assessment/Plan  Principal Problem:    Coronary artery disease involving native coronary artery of native heart with unstable angina pectoris ([place]) NSTEMI and cardiac cath 11/7/208 but had no intervention and did not follow up with cardiology.  Initial troponin 80 with second troponin 417.  Plan: Acute coronary syndrome order set. Aspirin 324 mg x 1 given, then daily aspirin. Nitroglycerin prn. Continue home beta-blocker, metoprolol.   Continue home ACE inhibitor (lisinopril-hydrochlorothiazide) Restart statin. Measure lipid panel. Telemetry.  Follow troponin levels. Oxygen support by nasal cannula as needed. Anticoagulation with Lovenox 1 mg/kg every 12 hours. Consult on-call cardiologist in the AM.   Active Problems:     HIV disease [facility]) Patient reports it is well controlled with a low viral load. Plan: Continue home Atripla.    Primary hypertension Plan: Continue home metoprolol and lisinopril-hydrochlorothiazide.   Continue amlodipine.    Mixed hyperlipidemia Patient previously took atorvastatin but stopped the medication. Plan: Restart atorvastatin.  Check lipid panel.    GERD without esophagitis Plan: Pantoprazole.    Anxiety Plan: Continue Trintellix. Continue home lorazepam.  Patient was not sure whether he was taking alprazolam or lorazepam; both are listed on his medication list.  Explained that he needs to take 1 or the other but not both and that he should speak with his primary care doctor to clarify which 1 he is to be taking.    Vertigo Plan: Continue as needed meclizine and outpatient follow-up.     DVT prophylaxis: Lovenox.  Code Status:   Full Code  Disposition Plan:   Patient is from:  Home  Anticipated DC to:  Home  Anticipated DC date:  05/10/2021  Anticipated DC barriers: Results of cardiac workup  Consults called:  None (call on call cardiologist in the am)  Admission status:  Observation   Severity of Illness: The appropriate patient status for this patient is OBSERVATION. Observation status is judged to be reasonable and necessary in order to provide the required intensity of service to ensure the patient's safety. The patient's presenting symptoms, physical exam findings, and initial radiographic and laboratory data in the context of their medical condition is felt to place them at decreased risk for further clinical deterioration. Furthermore, it is anticipated that the patient will be medically stable for discharge from the hospital within 2 midnights of admission. The following factors support the patient status of observation.   " The patient's presenting symptoms include chest pain, diaphoresis, and shortness of breath. " The physical exam findings include normal S1 and S2, respiratory exam without wheezing, rales, or rhonchi.. " The initial radiographic and laboratory data are notable for second troponin being elevated.    Tacey Ruiz MD [facility]  How to contact the [facility] Attending or Consulting provider [place] or covering provider during after hours [place], for this patient?   Check the care team in [facility] and look for a) attending/consulting [facility] provider listed and b) the [facility] team listed Log into [email or web address] and use [place]'s universal password to access. If you do not have the password, please contact the hospital operator. Locate the [facility] provider you are looking for under [facility] and page to a number that you can be directly reached. If you still have difficulty reaching the provider,  please page the [facility] (Director on Call) for the Hospitalists listed on amion for assistance.  05/09/2021, 8:32 PM

## 2021-05-09 NOTE — ED Notes (Signed)
Seen Pt  in room at this time , AAOx4, respi even-unlabored. In NAD . Denies CP/SOB at this time

## 2021-05-09 NOTE — ED Provider Notes (Signed)
Emergency Medicine Provider Triage Evaluation Note  Christopher Hart. , a 60 y.o. male  was evaluated in triage.  Pt complains of central chest pain with referral into the left arm with associated nausea, diaphoresis, and shortness of breath.  Patient reports onset occurred acutely approximately 1 hour prior to arrival.  The episode lasted about 5 minutes.  He would endorse for the last several weeks has had intermittent left arm discomfort without associated weakness or paralysis.  He presents today for evaluation of central chest pain with diaphoresis, nausea, left arm referral.  Review of Systems  Positive: Chest pain Negative: Fever chills sweats  Physical Exam  BP 132/87   Pulse 74   Temp 98 F (36.7 C)   Resp 18   SpO2 98%  Gen:   Awake, no distress NAD Resp:  Normal effort CTA MSK:   Moves extremities without difficulty  Other:  CVS: RRR  Medical Decision Making  Medically screening exam initiated at 1:29 PM.  Appropriate orders placed.  Christopher Hart. was informed that the remainder of the evaluation will be completed by another provider, this initial triage assessment does not replace that evaluation, and the importance of remaining in the ED until their evaluation is complete.  Patient with ED evaluation showed chest pain with left arm discomfort.   Melvenia Needles, PA-C 05/09/21 1331    Blake Divine, MD 05/09/21 [phone]

## 2021-05-09 NOTE — Progress Notes (Signed)
[place] for enoxaparin initiation and monitoring Indication: ACS / NSTEMI / CP  Patient Measurements: 98.1 kg recorded 05/09/21    Vital Signs: Temp: 98 F (36.7 C) (10/03 1917) Temp Source: Oral (10/03 1917) BP: 132/90 (10/03 1917) Pulse Rate: 64 (10/03 1917)  Labs: Recent Labs    05/09/21 1243 05/09/21 1928  HGB 14.4  --   HCT 44.3  --   PLT 378  --   CREATININE 1.15  --   TROPONINIHS 80* 417*    CrCl cannot be calculated (Unknown ideal weight.).   Medical History: Past Medical History:  Diagnosis Date   Allergy    BCE (basal cell epithelioma)    HIV infection (HCC)    HTN (hypertension)    Obesity     Medications:  Scheduled:   [START ON 05/10/2021] amLODipine  5 mg Oral Daily   aspirin  324 mg Oral NOW   Or   aspirin  300 mg Rectal NOW   [START ON 05/10/2021] aspirin  81 mg Oral Daily   atorvastatin  20 mg Oral Daily   efavirenz-emtricitabine-tenofovir  1 tablet Oral QHS   enoxaparin (LOVENOX) injection  100 mg Subcutaneous Q12H   [START ON 23/10/74] folic acid  1 mg Oral Daily   [START ON 05/10/2021] hydrochlorothiazide  25 mg Oral Daily   [START ON 05/10/2021] lisinopril  40 mg Oral Daily   metoprolol tartrate  50 mg Oral BID   nortriptyline  20 mg Oral QHS   pantoprazole  40 mg Oral Daily   [START ON 05/10/2021] vortioxetine HBr  20 mg Oral Daily    Assessment: 60 y.o. male w/ PMH of HIV, brain mass associated with Bell palsy, HTN, obesity, basal cell epithelioma, GERD, HDL, CKD and CAD with past medical history of NSTEMI who presents for assessment of some left-sided chest pain  Goal of Therapy:  Monitor platelets by anticoagulation protocol: Yes   Plan:  Start enoxaparin 1 mg/kg subcutaneously twice daily Monitor renal function for needed dose changes CBC at least every 3 days  Dallie Piles 05/09/2021,9:02 PM

## 2021-05-09 NOTE — ED Provider Notes (Signed)
[facility] Emergency Department Provider Note  ____________________________________________   Event Date/Time   First MD Initiated Contact with Patient 05/09/21 1322     (approximate)  I have reviewed the triage vital signs and the nursing notes.   HISTORY  Chief Complaint Chest Pain   HPI Christopher Hart. is a 60 y.o. male patient presents presents history of HIV, brain mass associated with Bell palsy, HTN, obesity, basal cell epithelioma, GERD, HDL, CKD and CAD with past medical history of NSTEMI who presents for assessment of some left-sided chest pain associate with shortness of breath and nausea with pain rating into the left arm that occurred today.  Patient states he has had episodes on and off for the last couple days.  States he has also had a couple episodes only lasting a few minutes avoid emergency room.  He did take 4 tablets baby aspirin with EMS.  He states he is currently chest pain-free.  He states he still had a mild nonproductive cough but no vomiting, diarrhea, abdominal pain, urinary symptoms, rash, headache, earache, sore throat or extremity pain.  No recent falls or injuries.  He does note that over the last couple nights whenever he lays flat he has had some pain in his chest.  He is not sure if it is clearly after meals or not.  He states his breathing is now better than it has been earlier today.  No other acute concerns at this time.         Past Medical History:  Diagnosis Date   Allergy    BCE (basal cell epithelioma)    HIV infection ([place])    HTN (hypertension)    Obesity     Patient Active Problem List   Diagnosis Date Noted   Unstable angina ([place]) 05/09/2021   HLD (hyperlipidemia) 12/24/2020   GERD (gastroesophageal reflux disease) 12/24/2020   Depression with anxiety 12/24/2020   CKD (chronic kidney disease), stage IIIa 12/24/2020   Brain mass 12/24/2020   CAD (coronary artery disease) 12/24/2020   Facial droop  12/24/2020   Healthcare maintenance 10/21/2018   NSTEMI (non-ST elevated myocardial infarction) ([place]) 06/12/2017   Hypertension 04/01/2012   HIV disease ([place]) 04/01/2012   Allergic rhinitis, mild 04/01/2012   Obesity (BMI 35.0-39.9 without comorbidity) 04/01/2012    Past Surgical History:  Procedure Laterality Date   Cataract surgery Bilateral    LEFT HEART CATH AND CORONARY ANGIOGRAPHY N/A 06/13/2017   Procedure: LEFT HEART CATH AND CORONARY ANGIOGRAPHY;  Surgeon: Minna Merritts, MD;  Location: [facility];  Service: Cardiovascular;  Laterality: N/A;    Prior to Admission medications   Medication Sig Start Date End Date Taking? Authorizing Provider  ALPRAZolam Duanne Moron) 1 MG tablet Take 1 mg by mouth 2 (two) times daily. 10/15/20   [provider]  aspirin 81 MG chewable tablet Chew 1 tablet (81 mg total) daily by mouth. 06/14/17   Max Sane, MD  atorvastatin (LIPITOR) 40 MG tablet Take 0.5 tablets (20 mg total) daily by mouth. Patient not taking: Reported on 12/24/2020 06/13/17   Max Sane, MD  buPROPion [facility] SR) 150 MG 12 hr tablet Take 150 mg daily by mouth. 05/25/17   [provider]  citalopram (CELEXA) 40 MG tablet Take 40 mg daily by mouth. 05/25/17   [provider]  efavirenz-emtricitabine-tenofovir (ATRIPLA) 600-200-300 MG tablet Take 1 tablet by mouth at bedtime.    [provider]  ezetimibe (ZETIA) 10 MG tablet  Take 10 mg by mouth daily. 11/03/20   [provider]  meclizine (ANTIVERT) 25 MG tablet Take 1 tablet (25 mg total) by mouth 3 (three) times daily as needed for dizziness or nausea. 01/09/21   Carrie Mew, MD  omeprazole (PRILOSEC) 20 MG capsule Take 20 mg by mouth daily. 10/17/18   [provider]  ondansetron (ZOFRAN ODT) 4 MG disintegrating tablet Take 1 tablet (4 mg total) by mouth every 8 (eight) hours as needed for nausea or vomiting. 01/09/21   Carrie Mew, MD  predniSONE (DELTASONE)  20 MG tablet Take 60 mg daily for 6 days, then take 50 mg for one day, then 40 mg for one day, then 30 mg for one day, then 20 mg for one day then 10 mg for one day, then stop. 12/25/20   Regalado, Belkys A, MD  TRINTELLIX 20 MG TABS tablet Take 20 mg by mouth daily. 09/09/20   [provider]    Allergies Penicillins  Family History  Problem Relation Age of Onset   CAD Mother        a. MI at age 42   CAD Maternal Grandmother    CAD Paternal Grandmother     Social History Social History   Tobacco Use   Smoking status: Never   Smokeless tobacco: Never  Vaping Use   Vaping Use: Never used  Substance Use Topics   Alcohol use: Yes    Comment: rare   Drug use: No    Review of Systems  Review of Systems  Constitutional:  Positive for diaphoresis. Negative for chills and fever.  HENT:  Negative for sore throat.   Eyes:  Negative for pain.  Respiratory:  Positive for cough and shortness of breath. Negative for stridor.   Cardiovascular:  Positive for chest pain.  Gastrointestinal:  Positive for nausea. Negative for vomiting.  Genitourinary:  Negative for dysuria.  Musculoskeletal:  Negative for myalgias.  Skin:  Negative for rash.  Neurological:  Negative for seizures, loss of consciousness and headaches.  Psychiatric/Behavioral:  Negative for suicidal ideas.   All other systems reviewed and are negative.    ____________________________________________   PHYSICAL EXAM:  VITAL SIGNS: ED Triage Vitals  Enc Vitals Group     BP 05/09/21 1310 132/87     Pulse Rate 05/09/21 1310 74     Resp 05/09/21 1310 18     Temp 05/09/21 1310 98 F (36.7 C)     Temp src --      SpO2 05/09/21 1310 98 %     Weight --      Height --      Head Circumference --      Peak Flow --      Pain Score 05/09/21 1249 0     Pain Loc --      Pain Edu? --      Excl. in [place]? --    Vitals:   05/09/21 1310  BP: 132/87  Pulse: 74  Resp: 18  Temp: 98 F (36.7 C)  SpO2: 98%    Physical Exam Vitals and nursing note reviewed.  Constitutional:      Appearance: He is well-developed. He is obese.  HENT:     Head: Normocephalic and atraumatic.     Right Ear: External ear normal.     Left Ear: External ear normal.     Nose: Nose normal.     Mouth/Throat:     Mouth: Mucous membranes are moist.  Eyes:  Conjunctiva/sclera: Conjunctivae normal.  Cardiovascular:     Rate and Rhythm: Normal rate and regular rhythm.     Heart sounds: No murmur heard. Pulmonary:     Effort: Pulmonary effort is normal. No respiratory distress.     Breath sounds: Normal breath sounds.  Abdominal:     Palpations: Abdomen is soft.     Tenderness: There is no abdominal tenderness. There is no right CVA tenderness or left CVA tenderness.  Musculoskeletal:     Cervical back: Neck supple.  Skin:    General: Skin is warm and dry.  Neurological:     Mental Status: He is alert and oriented to person, place, and time.     Cranial Nerves: Facial asymmetry (chronic R facial weakness) present.  Psychiatric:        Mood and Affect: Mood normal.     ____________________________________________   LABS (all labs ordered are listed, but only abnormal results are displayed)  Labs Reviewed  BASIC METABOLIC PANEL - Abnormal; Notable for the following components:      Result Value   Glucose, Bld 127 (*)    Calcium 8.8 (*)    All other components within normal limits  CBC - Abnormal; Notable for the following components:   MCV 79.7 (*)    MCH 25.9 (*)    All other components within normal limits  TROPONIN I (HIGH SENSITIVITY) - Abnormal; Notable for the following components:   Troponin I (High Sensitivity) 80 (*)    All other components within normal limits  RESP PANEL BY RT-PCR (FLU A&B, COVID) ARPGX2  TROPONIN I (HIGH SENSITIVITY)   ____________________________________________  EKG  ECG shows sinus rhythm with a ventricular rate of 74, normal axis, unremarkable intervals without  clear evidence of acute ischemia or significant arrhythmia.  Some nonspecific ST change versus artifact in inferior leads. ____________________________________________  RADIOLOGY  ED MD interpretation: Chest x-ray shows no focal consolidation, effusion, edema, pneumothorax or any other clear acute intrathoracic process.  Official radiology report(s): DG Chest 2 View  Result Date: 05/09/2021 CLINICAL DATA:  Chest pain. EXAM: CHEST - 2 VIEW COMPARISON:  January 09, 2021. FINDINGS: The heart size and mediastinal contours are within normal limits. Both lungs are clear. The visualized skeletal structures are unremarkable. IMPRESSION: No active cardiopulmonary disease. Electronically Signed   By: Marijo Conception M.D.   On: 05/09/2021 14:16    ____________________________________________   PROCEDURES  Procedure(s) performed (including Critical Care):  .1-3 Lead EKG Interpretation Performed by: Lucrezia Starch, MD Authorized by: Lucrezia Starch, MD     Interpretation: normal     ECG rate assessment: normal     Rhythm: sinus rhythm     Ectopy: none     Conduction: normal     ____________________________________________   INITIAL IMPRESSION / ASSESSMENT AND PLAN / ED COURSE      Patient presents with above-stated history exam for an episode of left-sided chest pain rating to the left arm associate with nausea and shortness of breath.  Earlier today.  Patient states he subsequently had a couple minutes of chest pain 2 or 3 times while waiting in emergency room waiting area but that he is currently chest pain-free on my assessment.  On arrival he is afebrile hemodynamically stable.  Differential includes ACS, pneumonia, bronchitis, pericarditis, myocarditis, chest wall inflammation, GERD, metabolic derangements, possible gastritis.  Given episodic nature with patient pain-free at this time and no evidence of hypoxia tachypnea or tachycardia or asymmetric lower extremity edema  hemoptysis I  have a lower suspicion for PE at this time.  Overall description is less consistent with dissection at this time.   ECG shows sinus rhythm with a ventricular rate of 74, normal axis, unremarkable intervals without clear evidence of acute ischemia or significant arrhythmia.  Some nonspecific ST change versus artifact in inferior leads.  Initial troponin is elevated ED compared to 9 4 months ago.  This is concerning for NSTEMI.  Patient already is already saved ASA.  Will defer heparin given he is currently chest pain-free pending repeat.  Chest x-ray shows no focal consolidation, effusion, edema, pneumothorax or any other clear acute intrathoracic process.  BMP shows no significant electrolyte or metabolic derangements.  CBC shows no leukocytosis or acute anemia.   Low suspicion for arterial pneumonia given absence of fever, leukocytosis and focal consolidation on chest x-ray.  I will plan to admit to medicine service for further evaluation and management with concerns for NSTEMI.  ____________________________________________   FINAL CLINICAL IMPRESSION(S) / ED DIAGNOSES  Final diagnoses:  NSTEMI (non-ST elevated myocardial infarction) ([place])    Medications  nitroGLYCERIN (NITROSTAT) SL tablet 0.4 mg (has no administration in time range)     ED Discharge Orders     None        Note:  This document was prepared using Dragon voice recognition software and may include unintentional dictation errors.    Lucrezia Starch, MD 05/09/21 [phone]

## 2021-05-09 NOTE — ED Triage Notes (Signed)
Pt comes into the ED via EMS from home with c/o left arm and chest pain with nausea, upon EMS arrival, pain free with no sx  HR88 160/90 100%RA ASA 324mg 

## 2021-05-10 ENCOUNTER — Observation Stay (HOSPITAL_COMMUNITY)
Admit: 2021-05-10 | Discharge: 2021-05-10 | Disposition: A | Discharge status: Home / Self Care | Payer: BC Managed Care – PPO | Attending: Internal Medicine | Admitting: Internal Medicine

## 2021-05-10 ENCOUNTER — Encounter: Payer: Self-pay | Admitting: Internal Medicine

## 2021-05-10 DIAGNOSIS — I10 Essential (primary) hypertension: Secondary | ICD-10-CM

## 2021-05-10 DIAGNOSIS — K219 Gastro-esophageal reflux disease without esophagitis: Secondary | ICD-10-CM | POA: Diagnosis not present

## 2021-05-10 DIAGNOSIS — I248 Other forms of acute ischemic heart disease: Secondary | ICD-10-CM | POA: Diagnosis not present

## 2021-05-10 DIAGNOSIS — I214 Non-ST elevation (NSTEMI) myocardial infarction: Principal | ICD-10-CM

## 2021-05-10 DIAGNOSIS — F419 Anxiety disorder, unspecified: Secondary | ICD-10-CM | POA: Diagnosis not present

## 2021-05-10 DIAGNOSIS — I25110 Atherosclerotic heart disease of native coronary artery with unstable angina pectoris: Secondary | ICD-10-CM | POA: Diagnosis not present

## 2021-05-10 DIAGNOSIS — E785 Hyperlipidemia, unspecified: Secondary | ICD-10-CM | POA: Diagnosis not present

## 2021-05-10 LAB — CBC
HCT: 45.6 % (ref 39.0–52.0)
Hemoglobin: 15 g/dL (ref 13.0–17.0)
MCH: 26 pg (ref 26.0–34.0)
MCHC: 32.9 g/dL (ref 30.0–36.0)
MCV: 79 fL — ABNORMAL LOW (ref 80.0–100.0)
Platelets: 380 10*3/uL (ref 150–400)
RBC: 5.77 MIL/uL (ref 4.22–5.81)
RDW: 13.2 % (ref 11.5–15.5)
WBC: 11.4 10*3/uL — ABNORMAL HIGH (ref 4.0–10.5)
nRBC: 0 % (ref 0.0–0.2)

## 2021-05-10 LAB — TROPONIN I (HIGH SENSITIVITY)
Troponin I (High Sensitivity): 335 ng/L (ref <18)
Troponin I (High Sensitivity): 390 ng/L (ref <18)

## 2021-05-10 LAB — LIPID PANEL
Cholesterol: 123 mg/dL (ref 0–200)
HDL: 28 mg/dL — ABNORMAL LOW (ref >40)
LDL Cholesterol: 70 mg/dL (ref 0–99)
Total CHOL/HDL Ratio: 4.4 RATIO
Triglycerides: 123 mg/dL (ref <150)
VLDL: 25 mg/dL (ref 0–40)

## 2021-05-10 LAB — BASIC METABOLIC PANEL
Anion gap: 8 (ref 5–15)
BUN: 13 mg/dL (ref 8–23)
CO2: 21 mmol/L — ABNORMAL LOW (ref 22–32)
Calcium: 8.9 mg/dL (ref 8.9–10.3)
Chloride: 102 mmol/L (ref 98–111)
Creatinine, Ser: 1.23 mg/dL (ref 0.61–1.24)
GFR, Estimated: >60 mL/min (ref >60)
Glucose, Bld: 109 mg/dL — ABNORMAL HIGH (ref 70–99)
Potassium: 4 mmol/L (ref 3.5–5.1)
Sodium: 131 mmol/L — ABNORMAL LOW (ref 135–145)

## 2021-05-10 MED ORDER — DIPHENHYDRAMINE HCL 25 MG PO CAPS: 50.0000 mg | ORAL_CAPSULE | Freq: Once | ORAL | Status: DC

## 2021-05-10 MED ORDER — SODIUM CHLORIDE 0.9% FLUSH
3.0000 mL | Freq: Two times a day (BID) | INTRAVENOUS | Status: DC
  Administered 2021-05-10 – 2021-05-11 (×2): 3 mL via INTRAVENOUS

## 2021-05-10 MED ORDER — MAGNESIUM SULFATE 2 GM/50ML IV SOLN
2.0000 g | Freq: Once | INTRAVENOUS | Status: AC
  Administered 2021-05-10: 2 g via INTRAVENOUS
  Filled 2021-05-10: qty 50

## 2021-05-10 MED ORDER — PREDNISONE 50 MG PO TABS: 50.0000 mg | ORAL_TABLET | Freq: Four times a day (QID) | ORAL | Status: DC

## 2021-05-10 MED ORDER — SODIUM CHLORIDE 0.9 % IV SOLN: 250.0000 mL | INTRAVENOUS | Status: DC | PRN

## 2021-05-10 MED ORDER — SODIUM CHLORIDE 0.9 % WEIGHT BASED INFUSION
3.0000 mL/kg/h | INTRAVENOUS | Status: DC
  Administered 2021-05-11: 3 mL/kg/h via INTRAVENOUS

## 2021-05-10 MED ORDER — ENOXAPARIN SODIUM 100 MG/ML IJ SOSY
1.0000 mg/kg | PREFILLED_SYRINGE | Freq: Two times a day (BID) | INTRAMUSCULAR | Status: DC
  Administered 2021-05-10: 95 mg via SUBCUTANEOUS
  Filled 2021-05-10 (×3): qty 0.95

## 2021-05-10 MED ORDER — SODIUM CHLORIDE 0.9 % WEIGHT BASED INFUSION: 1.0000 mL/kg/h | INTRAVENOUS | Status: DC

## 2021-05-10 MED ORDER — PREDNISONE 50 MG PO TABS
50.0000 mg | ORAL_TABLET | Freq: Four times a day (QID) | ORAL | Status: AC
  Administered 2021-05-10 – 2021-05-11 (×3): 50 mg via ORAL
  Filled 2021-05-10 (×3): qty 1

## 2021-05-10 MED ORDER — DIPHENHYDRAMINE HCL 50 MG/ML IJ SOLN
50.0000 mg | Freq: Once | INTRAMUSCULAR | Status: DC
Start: 2021-05-10 — End: 2021-05-11

## 2021-05-10 MED ORDER — SODIUM CHLORIDE 0.9% FLUSH: 3.0000 mL | INTRAVENOUS | Status: DC | PRN

## 2021-05-10 NOTE — Consult Note (Signed)
Cardiology Consultation:   Patient ID: Christopher Hart.; [id number]; [birth year]   Admit date: 05/09/2021 Date of Consult: 05/10/2021  Primary Care Provider: Gladstone Lighter, MD Primary Cardiologist: End (evaluated in consult in 2018 - never seen in the office) Primary Electrophysiologist:  None   Patient Profile:   Christopher Hart. is a 60 y.o. male with a hx of CAD with NSTEMI in 06/2017, HIV, HTN, HLD, Bell's palsy in 12/2020, vertigo, subpendymoma mass stable since 2018, peripheral neuropathy, depression, anxiety, and obesity who is being seen today for the evaluation of elevated troponin at the request of Dr. Manuella Ghazi.  History of Present Illness:   Christopher Hart was admitted to the hospital in 06/2017 with an NSTEMI.  Troponin peaked at 3.5.  Echo showed an EF of 60 to 65%, no regional wall motion abnormalities, normal LV diastolic function parameters, mild mitral regurgitation, mildly dilated left atrium, normal RV systolic function and PASP.  LHC showed a large LAD with 30 to 40% proximal/mid/distal disease, occluded mid/distal LCx, 30% proximal RCA stenosis, and 40% distal RCA stenosis.  Medical management was recommended.  Following discharge, he was lost to follow-up.  He was admitted to the hospital in 12/2020 with Bell's palsy and found to have a subpendymoma mass that was stable since 2018, which is followed by neurology.  MRI was negative for acute CVA.  He was admitted to [facility] on 05/09/2021 with a 2-3 week history of intermittent left shoulder and upper arm pain that he attributed to musculoskeletal etiology that improved with ibuprofen.  However, on 05/09/2021, while walking his dog, he developed left shoulder and upper arm pain that radiated into the left side of his chest and felt like "my heart was being ripped apart." He noted associated SOB, nausea without emesis, and diaphoresis. In total, symptoms lasted about 20 minutes before resolution without intervention  Vitals  stable.  Initial high-sensitivity troponin 88 with a delta and peak troponin of 417.  EKG showed NSR, 74 bpm, no acute ST-T changes.  Chest x-ray showed no active cardiopulmonary disease.  COVID-negative.  He received ASA 324 mg in the field.  He was placed on full dose Lovenox and continued on home medications.  Currently chest pain free.  He did report a contrast allergy in 2018 and underwent pre-medication for LHC.  He is not sure if he underwent pre-medication for CT/MRI with contrast earlier this year. He has been under significant stress lately and wonders if this has contributed. Coming into the hospital has increased this stress.     Past Medical History:  Diagnosis Date   Allergic rhinitis    Anxiety    BCE (basal cell epithelioma)    Has had 1 basal cell skin cancer lesions   CAD (coronary artery disease)    Cath 06/13/17: Mid Cx 100% stenosed. 40% stenosis of dist RCA, distal LAD. 30% stenosis prox RCA, prox LAD. Unable to stent.   Congenital cataract of both eyes    Initially legally blind, but sight improved with cataract removal   Difficulty walking    Idiopathic peripheral neuropathy.  Followed by neurologist.  EMG 01/20/2021: Abnormal.  Sensorimotor peripheral neuropathy with axonal features.  No conduction blocks.  No evidence of myopathy.   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   GERD (gastroesophageal reflux disease)    HIV infection (HCC)    Low viral load. Takes medication. Managed by primary care.   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 Sees neurologist, Dr. Melrose Nakayama.   Vertigo     Past Surgical History:  Procedure Laterality Date   Cataract surgery Bilateral    LEFT HEART CATH AND CORONARY ANGIOGRAPHY N/A  06/13/2017   Procedure: LEFT HEART CATH AND CORONARY ANGIOGRAPHY;  Surgeon: Minna Merritts, MD;  Location: [facility];  Service: Cardiovascular;  Laterality: N/A;     Home Meds: Prior to Admission medications   Medication Sig Start Date End Date Taking? Authorizing Provider  ALPRAZolam Duanne Moron) 1 MG tablet Take 1 mg by mouth 2 (two) times daily. 10/15/20  Yes [provider]  amLODipine (NORVASC) 5 MG tablet Take 1 tablet by mouth daily.   Yes [provider]  aspirin 81 MG chewable tablet Chew 1 tablet (81 mg total) daily by mouth. 06/14/17  Yes Max Sane, MD  efavirenz-emtricitabine-tenofovir (ATRIPLA) 600-200-300 MG tablet Take 1 tablet by mouth at bedtime.   Yes [provider]  folic acid (FOLVITE) 1 MG tablet Take 1 tablet by mouth daily.   Yes [provider]  lisinopril-hydrochlorothiazide (ZESTORETIC) 20-12.5 MG tablet Take 2 tablets by mouth daily. 02/12/21  Yes [provider]  LORazepam (ATIVAN) 1 MG tablet Take 1 tablet by mouth every 8 (eight) hours as needed.   Yes [provider]  meclizine (ANTIVERT) 25 MG tablet Take 1 tablet (25 mg total) by mouth 3 (three) times daily as needed for dizziness or nausea. 01/09/21  Yes Carrie Mew, MD  metoprolol tartrate (LOPRESSOR) 50 MG tablet Take 50 mg by mouth 2 (two) times daily. 01/12/21  Yes [provider]  nortriptyline (PAMELOR) 10 MG capsule Take 20 capsules by mouth at bedtime. 04/19/21  Yes [provider]  omeprazole (PRILOSEC) 20 MG capsule Take 20 mg by mouth daily. 10/17/18  Yes [provider]  ondansetron (ZOFRAN ODT) 4 MG disintegrating tablet Take 1 tablet (4 mg total) by mouth every 8 (eight) hours as needed for nausea or vomiting. 01/09/21  Yes Carrie Mew, MD  TRINTELLIX 20 MG TABS tablet Take 20 mg by mouth daily. 09/09/20  Yes [provider]  atorvastatin (LIPITOR) 40 MG tablet Take 0.5 tablets (20 mg total) daily by  mouth. Patient not taking: No sig reported 06/13/17   Max Sane, MD  buPROPion [facility] SR) 150 MG 12 hr tablet Take 150 mg daily by mouth. Patient not taking: Reported on 05/09/2021 05/25/17   [provider]  citalopram (CELEXA) 40 MG tablet Take 40 mg daily by mouth. Patient not taking: Reported on 05/09/2021 05/25/17   [provider]  ezetimibe (ZETIA) 10 MG tablet Take 10 mg by mouth daily. Patient not taking: Reported on 05/09/2021 11/03/20   [provider]  predniSONE (DELTASONE) 20 MG tablet Take 60 mg daily for 6 days, then take 50 mg for one day, then 40 mg for one day, then 30 mg for one day, then 20 mg for one day then 10 mg for one day, then stop. Patient not taking: No sig reported 12/25/20   Regalado, Jerald Kief A, MD    Inpatient Medications: Scheduled Meds:  amLODipine  5 mg Oral Daily   aspirin  324 mg Oral NOW   Or   aspirin  300 mg Rectal NOW  aspirin  81 mg Oral Daily   atorvastatin  20 mg Oral Daily   efavirenz-emtricitabine-tenofovir  1 tablet Oral QHS   enoxaparin (LOVENOX) injection  100 mg Subcutaneous Q33A   folic acid  1 mg Oral Daily   hydrochlorothiazide  25 mg Oral Daily   lisinopril  40 mg Oral Daily   metoprolol tartrate  50 mg Oral BID   nortriptyline  20 mg Oral QHS   pantoprazole  40 mg Oral Daily   vortioxetine HBr  20 mg Oral Daily   Continuous Infusions:  PRN Meds: acetaminophen **OR** acetaminophen, bisacodyl, HYDROcodone-acetaminophen, LORazepam, meclizine, metoprolol tartrate, morphine injection, nitroGLYCERIN, ondansetron **OR** ondansetron (ZOFRAN) IV, senna-docusate  Allergies:   Allergies  Allergen Reactions   Penicillins     [place]    Social History:   Social History   Socioeconomic History   Marital status: Single    Spouse name: Not on file   Number of children: Not on file   Years of education: Not on file   Highest education level: Not on file  Occupational History   Occupation: Unemployed    Tobacco Use   Smoking status: Never   Smokeless tobacco: Never  Vaping Use   Vaping Use: Never used  Substance and Sexual Activity   Alcohol use: Yes    Comment: rare   Drug use: No   Sexual activity: Not Currently  Other Topics Concern   Not on file  Social History Narrative   Not on file   Social Determinants of Health   Financial Resource Strain: Not on file  Food Insecurity: Not on file  Transportation Needs: Not on file  Physical Activity: Not on file  Stress: Not on file  Social Connections: Not on file  Intimate Partner Violence: Not on file     Family History:   Family History  Problem Relation Age of Onset   CAD Mother        a. MI at age 62   CAD Maternal Grandmother    CAD Paternal Grandmother     ROS:  Review of Systems  Constitutional:  Positive for diaphoresis and malaise/fatigue. Negative for chills, fever and weight loss.  HENT:  Negative for congestion.   Eyes:  Negative for discharge and redness.  Respiratory:  Positive for shortness of breath. Negative for cough, sputum production and wheezing.   Cardiovascular:  Positive for chest pain. Negative for palpitations, orthopnea, claudication, leg swelling and PND.  Gastrointestinal:  Positive for nausea. Negative for abdominal pain, heartburn and vomiting.  Musculoskeletal:  Negative for falls and myalgias.  Skin:  Negative for rash.  Neurological:  Negative for dizziness, tingling, tremors, sensory change, speech change, focal weakness, loss of consciousness and weakness.  Endo/Heme/Allergies:  Does not bruise/bleed easily.  Psychiatric/Behavioral:  Negative for substance abuse. The patient is not nervous/anxious.   All other systems reviewed and are negative.    Physical Exam/Data:   Vitals:   05/10/21 0505 05/10/21 0730 05/10/21 0937 05/10/21 1341  BP: 112/73 126/83 132/78 119/88  Pulse: 68 92 90 75  Resp: 14 17 18 11   Temp:  98.2 F (36.8 C)  98 F (36.7 C)  TempSrc:  Oral  Oral  SpO2:  99% 97% 99% 99%    Intake/Output Summary (Last 24 hours) at 05/10/2021 1438 Last data filed at 05/10/2021 0138 Gross per 24 hour  Intake 46.69 ml  Output --  Net 46.69 ml   There were no vitals filed for this visit. There is  no height or weight on file to calculate BMI.   Physical Exam: General: Well developed, well nourished, in no acute distress. Head: Normocephalic, atraumatic, sclera non-icteric, no xanthomas, nares without discharge.  Neck: Negative for carotid bruits. JVD not elevated. Lungs: Clear bilaterally to auscultation without wheezes, rales, or rhonchi. Breathing is unlabored. Heart: RRR with S1 S2. No murmurs, rubs, or gallops appreciated. Abdomen: Soft, non-tender, non-distended with normoactive bowel sounds. No hepatomegaly. No rebound/guarding. No obvious abdominal masses. Msk:  Strength and tone appear normal for age. Extremities: No clubbing or cyanosis. No edema. Distal pedal pulses are 2+ and equal bilaterally. Neuro: Alert and oriented X 3. No facial asymmetry. No focal deficit. Moves all extremities spontaneously. Psych:  Responds to questions appropriately with a normal affect.   EKG:  The EKG was personally reviewed and demonstrates: NSR, 74 bpm, no acute ST-T changes.  Repeat EKG this morning demonstrated NSR, 68 bpm, no acute ST-T changes Telemetry:  Telemetry was personally reviewed and demonstrates: SR  Weights: There were no vitals filed for this visit.  Relevant CV Studies:  2D echo 06/12/2017: - Left ventricle: The cavity size was normal. There was mild    concentric hypertrophy. Systolic function was normal. The    estimated ejection fraction was in the range of 60% to 65%. Wall    motion was normal; there were no regional wall motion    abnormalities. Left ventricular diastolic function parameters    were normal.  - Mitral valve: There was mild regurgitation.  - Left atrium: The atrium was mildly dilated.  - Right ventricle: Systolic function  was normal.  - Pulmonary arteries: Systolic pressure was within the normal    range.  __________  [facility] 06/13/2017: Coronary angiography:  Coronary dominance: Right  Left mainstem:   Large vessel that bifurcates into the LAD and left circumflex, no significant disease noted  Left anterior descending (LAD):   Large vessel that extends to the apical region, diagonal branch 2 of moderate size, 30-40% proximal mid and distal disease.   Left circumflex (LCx):  Large vessel with OM branch 2, occluded distal circumflex after OM 2.  No significant collaterals noted High OM or ramus noted.  Very small branch coming off the ramus is diffusely diseased  Right coronary artery (RCA):  Right dominant vessel with PL and PDA, mild 30-40% proximal and distal disease  Left ventriculography: Left ventricular systolic function is normal, LVEF is estimated at 55-65%, there is no significant mitral regurgitation , no significant aortic valve stenosis  Final Conclusions:   Culprit lesion likely distal circumflex which appears occluded There is also small diffusely diseased vessel coming off a ramus/high OM Otherwise nonobstructive disease noted in LAD and RCA Medical management recommended  Recommendations:  Aspirin and Plavix, High-dose statin Stay on lisinopril and metoprolol  follow-up in clinic  Laboratory Data:  Chemistry Recent Labs  Lab 05/09/21 1243 05/10/21 0634  NA 135 131*  K 3.7 4.0  CL 105 102  CO2 24 21*  GLUCOSE 127* 109*  BUN 13 13  CREATININE 1.15 1.23  CALCIUM 8.8* 8.9  GFRNONAA >60 >60  ANIONGAP 6 8    No results for input(s): PROT, ALBUMIN, AST, ALT, ALKPHOS, BILITOT in the last 168 hours. Hematology Recent Labs  Lab 05/09/21 1243 05/10/21 0634  WBC 8.9 11.4*  RBC 5.56 5.77  HGB 14.4 15.0  HCT 44.3 45.6  MCV 79.7* 79.0*  MCH 25.9* 26.0  MCHC 32.5 32.9  RDW 13.0 13.2  PLT 378  380   Cardiac EnzymesNo results for input(s): TROPONINI in the last 168 hours. No  results for input(s): TROPIPOC in the last 168 hours.  BNPNo results for input(s): BNP, PROBNP in the last 168 hours.  DDimer No results for input(s): DDIMER in the last 168 hours.  Radiology/Studies:  DG Chest 2 View  Result Date: 05/09/2021 IMPRESSION: No active cardiopulmonary disease. Electronically Signed   By: Marijo Conception M.D.   On: 05/09/2021 14:16    Assessment and Plan:   1.  CAD involving the native coronary arteries with NSTEMI: -Currently, chest pain free -ASA -Full dose Lovenox  -Long discussion with patient regarding further evaluation  -Recommend LHC, however, he has previously noted a contrast allergy and was premedicate for LHC in 2018. He is uncertain if he required pre-medication for CT/MRI with contrast earlier this year -Given he is chest pain free, we will plan to pre-medicate for assumed/patient reported contrast allergy with LHC 10/5 -Risks and benefits of cardiac catheterization have been discussed with the patient including risks of bleeding, bruising, infection, kidney damage, stroke, heart attack, urgent need for bypass, and death. The patient understands these risks and is willing to proceed with the procedure. All questions have been answered and concerns listened to -Metoprolol, Lipitor  -PRN SL NTG and morphine   2.  HTN: -Blood pressure currently well controlled -PTA amlodipine, lisinopril/HCTZ, and Lopressor  3. HLD: -LDL 70 this admission -Not taking a statin PTA -Start Lipitor   For questions or updates, please contact [place] Please consult [email or web address] for contact info under Cardiology/STEMI.   Signed, Christell Faith, PA-C [place] Pager: [phone] 05/10/2021, 2:38 PM

## 2021-05-10 NOTE — ED Notes (Signed)
Cardiology at bedside assessing pt and updating pt on plan of care.

## 2021-05-10 NOTE — Assessment & Plan Note (Signed)
Troponin peaked at 417.  Cath scheduled for tomorrow.  Continue aspirin, statin, beta-blocker, lisinopril and nitro as needed.  Lovenox subcu twice daily

## 2021-05-10 NOTE — Progress Notes (Signed)
  Progress Note    [name]   DUK:[id number]  DOB: 11-Nov-1960  DOA: 05/09/2021     0 Date of Service: 05/10/2021   60 y.o. male with medical history significant for coronary artery disease (NSTEMI and cardiac cath 06/13/17 but no stents placed and is not followed by a cardiologist), HIV, hypertension, hyperlipidemia, Bell's palsy with residual facial droop, GERD, chronic difficulty walking admitted for chest pain and elevated troponins.  Patient is demanding food or wanting to leave.  Subjective:  Improvement noted in chest pain  Hospital Problems * NSTEMI (non-ST elevated myocardial infarction) ([place]) Troponin peaked at 417.  Cath scheduled for tomorrow.  Continue aspirin, statin, beta-blocker, lisinopril and nitro as needed.  Lovenox subcu twice daily  Coronary artery disease involving native coronary artery of native heart with unstable angina pectoris [facility]) Cardiac cath planned for tomorrow by cardiology.  Continue aspirin, Lipitor, lisinopril, metoprolol and nitro.  Full dose Lovenox.  Patient is chest pain-free at this time.  Primary hypertension Continue Norvasc, HCTZ, lisinopril, metoprolol.  Adjust medicine as needed     Objective Vital signs were reviewed and unremarkable.  Vitals:   05/10/21 1517 05/10/21 1557 05/10/21 2124 05/10/21 2236  BP: 126/80  119/76 127/77  Pulse: 77  80 77  Resp:   14   Temp: 98.2 F (36.8 C)  98.4 F (36.9 C)   TempSrc: Oral  Oral   SpO2: 100%  99%   Weight:  96.2 kg     96.2 kg  Exam Physical Exam 60 year old male lying in the bed comfortably without any acute distress Lungs clear to auscultation bilaterally no wheezing rales rhonchi or crepitation Cardiovascular S1-S2 normal, no murmur rales or gallop Abdomen soft, benign Neuro alert and oriented, nonfocal Skin no rash or lesion  Labs / Other Information My review of labs, imaging, notes and other tests is significant for elevated troponins     Time spent: 35  minutes [facility] 05/10/2021, 11:13 PM

## 2021-05-10 NOTE — Assessment & Plan Note (Signed)
Cardiac cath planned for tomorrow by cardiology.  Continue aspirin, Lipitor, lisinopril, metoprolol and nitro.  Full dose Lovenox.  Patient is chest pain-free at this time.

## 2021-05-10 NOTE — ED Notes (Signed)
Lab at bedside drawing ordered labs. Pt is extremely fearful of needles and jerks away when he is stuck. Pt informed of how unsafe it is to jump around when there is an open needle in his arm and he states he cannot help it because it is so painful. Per lab, smallest needle possible being used. Per lab, they will not come stick pt again. Secure chat sent to MD to notify of situation.

## 2021-05-10 NOTE — Progress Notes (Signed)
ECHO tech at bedside preforming echocardiogram

## 2021-05-10 NOTE — ED Notes (Signed)
Admitting MD made aware of pt's trop levels . PT noted w/ SR per CM, denies CP/SOB at this time

## 2021-05-10 NOTE — H&P (View-Only) (Signed)
Cardiology Consultation:   Patient ID: Christopher Hart.; [id number]; Dec 13, 1960   Admit date: 05/09/2021 Date of Consult: 05/10/2021  Primary Care Provider: Gladstone Lighter, MD Primary Cardiologist: End (evaluated in consult in 2018 - never seen in the office) Primary Electrophysiologist:  None   Patient Profile:   Christopher Hart. is a 60 y.o. male with a hx of CAD with NSTEMI in 06/2017, HIV, HTN, HLD, Bell's palsy in 12/2020, vertigo, subpendymoma mass stable since 2018, peripheral neuropathy, depression, anxiety, and obesity who is being seen today for the evaluation of elevated troponin at the request of Dr. Manuella Ghazi.  History of Present Illness:   Christopher Hart was admitted to the hospital in 06/2017 with an NSTEMI.  Troponin peaked at 3.5.  Echo showed an EF of 60 to 65%, no regional wall motion abnormalities, normal LV diastolic function parameters, mild mitral regurgitation, mildly dilated left atrium, normal RV systolic function and PASP.  LHC showed a large LAD with 30 to 40% proximal/mid/distal disease, occluded mid/distal LCx, 30% proximal RCA stenosis, and 40% distal RCA stenosis.  Medical management was recommended.  Following discharge, he was lost to follow-up.  He was admitted to the hospital in 12/2020 with Bell's palsy and found to have a subpendymoma mass that was stable since 2018, which is followed by neurology.  MRI was negative for acute CVA.  He was admitted to [facility] on 05/09/2021 with a 2-3 week history of intermittent left shoulder and upper arm pain that he attributed to musculoskeletal etiology that improved with ibuprofen.  However, on 05/09/2021, while walking his dog, he developed left shoulder and upper arm pain that radiated into the left side of his chest and felt like "my heart was being ripped apart." He noted associated SOB, nausea without emesis, and diaphoresis. In total, symptoms lasted about 20 minutes before resolution without intervention  Vitals  stable.  Initial high-sensitivity troponin 88 with a delta and peak troponin of 417.  EKG showed NSR, 74 bpm, no acute ST-T changes.  Chest x-ray showed no active cardiopulmonary disease.  COVID-negative.  He received ASA 324 mg in the field.  He was placed on full dose Lovenox and continued on home medications.  Currently chest pain free.  He did report a contrast allergy in 2018 and underwent pre-medication for LHC.  He is not sure if he underwent pre-medication for CT/MRI with contrast earlier this year. He has been under significant stress lately and wonders if this has contributed. Coming into the hospital has increased this stress.     Past Medical History:  Diagnosis Date   Allergic rhinitis    Anxiety    BCE (basal cell epithelioma)    Has had 75 basal cell skin cancer lesions   CAD (coronary artery disease)    Cath 06/13/17: Mid Cx 100% stenosed. 40% stenosis of dist RCA, distal LAD. 30% stenosis prox RCA, prox LAD. Unable to stent.   Congenital cataract of both eyes    Initially legally blind, but sight improved with cataract removal   Difficulty walking    Idiopathic peripheral neuropathy.  Followed by neurologist.  EMG 01/20/2021: Abnormal.  Sensorimotor peripheral neuropathy with axonal features.  No conduction blocks.  No evidence of myopathy.   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   GERD (gastroesophageal reflux disease)    HIV infection (HCC)    Low viral load. Takes medication. Managed by primary care.   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 Sees neurologist, Dr. Melrose Nakayama.   Vertigo     Past Surgical History:  Procedure Laterality Date   Cataract surgery Bilateral    LEFT HEART CATH AND CORONARY ANGIOGRAPHY N/A  06/13/2017   Procedure: LEFT HEART CATH AND CORONARY ANGIOGRAPHY;  Surgeon: Minna Merritts, MD;  Location: [facility];  Service: Cardiovascular;  Laterality: N/A;     Home Meds: Prior to Admission medications   Medication Sig Start Date End Date Taking? Authorizing Provider  ALPRAZolam Duanne Moron) 1 MG tablet Take 1 mg by mouth 2 (two) times daily. 10/15/20  Yes [provider]  amLODipine (NORVASC) 5 MG tablet Take 1 tablet by mouth daily.   Yes [provider]  aspirin 81 MG chewable tablet Chew 1 tablet (81 mg total) daily by mouth. 06/14/17  Yes Max Sane, MD  efavirenz-emtricitabine-tenofovir (ATRIPLA) 600-200-300 MG tablet Take 1 tablet by mouth at bedtime.   Yes [provider]  folic acid (FOLVITE) 1 MG tablet Take 1 tablet by mouth daily.   Yes [provider]  lisinopril-hydrochlorothiazide (ZESTORETIC) 20-12.5 MG tablet Take 2 tablets by mouth daily. 02/12/21  Yes [provider]  LORazepam (ATIVAN) 1 MG tablet Take 1 tablet by mouth every 8 (eight) hours as needed.   Yes [provider]  meclizine (ANTIVERT) 25 MG tablet Take 1 tablet (25 mg total) by mouth 3 (three) times daily as needed for dizziness or nausea. 01/09/21  Yes Carrie Mew, MD  metoprolol tartrate (LOPRESSOR) 50 MG tablet Take 50 mg by mouth 2 (two) times daily. 01/12/21  Yes [provider]  nortriptyline (PAMELOR) 10 MG capsule Take 20 capsules by mouth at bedtime. 04/19/21  Yes [provider]  omeprazole (PRILOSEC) 20 MG capsule Take 20 mg by mouth daily. 10/17/18  Yes [provider]  ondansetron (ZOFRAN ODT) 4 MG disintegrating tablet Take 1 tablet (4 mg total) by mouth every 8 (eight) hours as needed for nausea or vomiting. 01/09/21  Yes Carrie Mew, MD  TRINTELLIX 20 MG TABS tablet Take 20 mg by mouth daily. 09/09/20  Yes [provider]  atorvastatin (LIPITOR) 40 MG tablet Take 0.5 tablets (20 mg total) daily by  mouth. Patient not taking: No sig reported 06/13/17   Max Sane, MD  buPROPion [facility] SR) 150 MG 12 hr tablet Take 150 mg daily by mouth. Patient not taking: Reported on 05/09/2021 05/25/17   [provider]  citalopram (CELEXA) 40 MG tablet Take 40 mg daily by mouth. Patient not taking: Reported on 05/09/2021 05/25/17   [provider]  ezetimibe (ZETIA) 10 MG tablet Take 10 mg by mouth daily. Patient not taking: Reported on 05/09/2021 11/03/20   [provider]  predniSONE (DELTASONE) 20 MG tablet Take 60 mg daily for 6 days, then take 50 mg for one day, then 40 mg for one day, then 30 mg for one day, then 20 mg for one day then 10 mg for one day, then stop. Patient not taking: No sig reported 12/25/20   Regalado, Jerald Kief A, MD    Inpatient Medications: Scheduled Meds:  amLODipine  5 mg Oral Daily   aspirin  324 mg Oral NOW   Or   aspirin  300 mg Rectal NOW  aspirin  81 mg Oral Daily   atorvastatin  20 mg Oral Daily   efavirenz-emtricitabine-tenofovir  1 tablet Oral QHS   enoxaparin (LOVENOX) injection  100 mg Subcutaneous X90W   folic acid  1 mg Oral Daily   hydrochlorothiazide  25 mg Oral Daily   lisinopril  40 mg Oral Daily   metoprolol tartrate  50 mg Oral BID   nortriptyline  20 mg Oral QHS   pantoprazole  40 mg Oral Daily   vortioxetine HBr  20 mg Oral Daily   Continuous Infusions:  PRN Meds: acetaminophen **OR** acetaminophen, bisacodyl, HYDROcodone-acetaminophen, LORazepam, meclizine, metoprolol tartrate, morphine injection, nitroGLYCERIN, ondansetron **OR** ondansetron (ZOFRAN) IV, senna-docusate  Allergies:   Allergies  Allergen Reactions   Penicillins     [place]    Social History:   Social History   Socioeconomic History   Marital status: Single    Spouse name: Not on file   Number of children: Not on file   Years of education: Not on file   Highest education level: Not on file  Occupational History   Occupation: Unemployed    Tobacco Use   Smoking status: Never   Smokeless tobacco: Never  Vaping Use   Vaping Use: Never used  Substance and Sexual Activity   Alcohol use: Yes    Comment: rare   Drug use: No   Sexual activity: Not Currently  Other Topics Concern   Not on file  Social History Narrative   Not on file   Social Determinants of Health   Financial Resource Strain: Not on file  Food Insecurity: Not on file  Transportation Needs: Not on file  Physical Activity: Not on file  Stress: Not on file  Social Connections: Not on file  Intimate Partner Violence: Not on file     Family History:   Family History  Problem Relation Age of Onset   CAD Mother        a. MI at age 94   CAD Maternal Grandmother    CAD Paternal Grandmother     ROS:  Review of Systems  Constitutional:  Positive for diaphoresis and malaise/fatigue. Negative for chills, fever and weight loss.  HENT:  Negative for congestion.   Eyes:  Negative for discharge and redness.  Respiratory:  Positive for shortness of breath. Negative for cough, sputum production and wheezing.   Cardiovascular:  Positive for chest pain. Negative for palpitations, orthopnea, claudication, leg swelling and PND.  Gastrointestinal:  Positive for nausea. Negative for abdominal pain, heartburn and vomiting.  Musculoskeletal:  Negative for falls and myalgias.  Skin:  Negative for rash.  Neurological:  Negative for dizziness, tingling, tremors, sensory change, speech change, focal weakness, loss of consciousness and weakness.  Endo/Heme/Allergies:  Does not bruise/bleed easily.  Psychiatric/Behavioral:  Negative for substance abuse. The patient is not nervous/anxious.   All other systems reviewed and are negative.    Physical Exam/Data:   Vitals:   05/10/21 0505 05/10/21 0730 05/10/21 0937 05/10/21 1341  BP: 112/73 126/83 132/78 119/88  Pulse: 68 92 90 75  Resp: 14 17 18 11   Temp:  98.2 F (36.8 C)  98 F (36.7 C)  TempSrc:  Oral  Oral  SpO2:  99% 97% 99% 99%    Intake/Output Summary (Last 24 hours) at 05/10/2021 1438 Last data filed at 05/10/2021 0138 Gross per 24 hour  Intake 46.69 ml  Output --  Net 46.69 ml   There were no vitals filed for this visit. There is  no height or weight on file to calculate BMI.   Physical Exam: General: Well developed, well nourished, in no acute distress. Head: Normocephalic, atraumatic, sclera non-icteric, no xanthomas, nares without discharge.  Neck: Negative for carotid bruits. JVD not elevated. Lungs: Clear bilaterally to auscultation without wheezes, rales, or rhonchi. Breathing is unlabored. Heart: RRR with S1 S2. No murmurs, rubs, or gallops appreciated. Abdomen: Soft, non-tender, non-distended with normoactive bowel sounds. No hepatomegaly. No rebound/guarding. No obvious abdominal masses. Msk:  Strength and tone appear normal for age. Extremities: No clubbing or cyanosis. No edema. Distal pedal pulses are 2+ and equal bilaterally. Neuro: Alert and oriented X 3. No facial asymmetry. No focal deficit. Moves all extremities spontaneously. Psych:  Responds to questions appropriately with a normal affect.   EKG:  The EKG was personally reviewed and demonstrates: NSR, 74 bpm, no acute ST-T changes.  Repeat EKG this morning demonstrated NSR, 68 bpm, no acute ST-T changes Telemetry:  Telemetry was personally reviewed and demonstrates: SR  Weights: There were no vitals filed for this visit.  Relevant CV Studies:  2D echo 06/12/2017: - Left ventricle: The cavity size was normal. There was mild    concentric hypertrophy. Systolic function was normal. The    estimated ejection fraction was in the range of 60% to 65%. Wall    motion was normal; there were no regional wall motion    abnormalities. Left ventricular diastolic function parameters    were normal.  - Mitral valve: There was mild regurgitation.  - Left atrium: The atrium was mildly dilated.  - Right ventricle: Systolic function  was normal.  - Pulmonary arteries: Systolic pressure was within the normal    range.  __________  [facility] 06/13/2017: Coronary angiography:  Coronary dominance: Right  Left mainstem:   Large vessel that bifurcates into the LAD and left circumflex, no significant disease noted  Left anterior descending (LAD):   Large vessel that extends to the apical region, diagonal branch 2 of moderate size, 30-40% proximal mid and distal disease.   Left circumflex (LCx):  Large vessel with OM branch 2, occluded distal circumflex after OM 2.  No significant collaterals noted High OM or ramus noted.  Very small branch coming off the ramus is diffusely diseased  Right coronary artery (RCA):  Right dominant vessel with PL and PDA, mild 30-40% proximal and distal disease  Left ventriculography: Left ventricular systolic function is normal, LVEF is estimated at 55-65%, there is no significant mitral regurgitation , no significant aortic valve stenosis  Final Conclusions:   Culprit lesion likely distal circumflex which appears occluded There is also small diffusely diseased vessel coming off a ramus/high OM Otherwise nonobstructive disease noted in LAD and RCA Medical management recommended  Recommendations:  Aspirin and Plavix, High-dose statin Stay on lisinopril and metoprolol  follow-up in clinic  Laboratory Data:  Chemistry Recent Labs  Lab 05/09/21 1243 05/10/21 0634  NA 135 131*  K 3.7 4.0  CL 105 102  CO2 24 21*  GLUCOSE 127* 109*  BUN 13 13  CREATININE 1.15 1.23  CALCIUM 8.8* 8.9  GFRNONAA >60 >60  ANIONGAP 6 8    No results for input(s): PROT, ALBUMIN, AST, ALT, ALKPHOS, BILITOT in the last 168 hours. Hematology Recent Labs  Lab 05/09/21 1243 05/10/21 0634  WBC 8.9 11.4*  RBC 5.56 5.77  HGB 14.4 15.0  HCT 44.3 45.6  MCV 79.7* 79.0*  MCH 25.9* 26.0  MCHC 32.5 32.9  RDW 13.0 13.2  PLT 378  380   Cardiac EnzymesNo results for input(s): TROPONINI in the last 168 hours. No  results for input(s): TROPIPOC in the last 168 hours.  BNPNo results for input(s): BNP, PROBNP in the last 168 hours.  DDimer No results for input(s): DDIMER in the last 168 hours.  Radiology/Studies:  DG Chest 2 View  Result Date: 05/09/2021 IMPRESSION: No active cardiopulmonary disease. Electronically Signed   By: Marijo Conception M.D.   On: 05/09/2021 14:16    Assessment and Plan:   1.  CAD involving the native coronary arteries with NSTEMI: -Currently, chest pain free -ASA -Full dose Lovenox  -Long discussion with patient regarding further evaluation  -Recommend LHC, however, he has previously noted a contrast allergy and was premedicate for LHC in 2018. He is uncertain if he required pre-medication for CT/MRI with contrast earlier this year -Given he is chest pain free, we will plan to pre-medicate for assumed/patient reported contrast allergy with LHC 10/5 -Risks and benefits of cardiac catheterization have been discussed with the patient including risks of bleeding, bruising, infection, kidney damage, stroke, heart attack, urgent need for bypass, and death. The patient understands these risks and is willing to proceed with the procedure. All questions have been answered and concerns listened to -Metoprolol, Lipitor  -PRN SL NTG and morphine   2.  HTN: -Blood pressure currently well controlled -PTA amlodipine, lisinopril/HCTZ, and Lopressor  3. HLD: -LDL 70 this admission -Not taking a statin PTA -Start Lipitor   For questions or updates, please contact [place] Please consult [email or web address] for contact info under Cardiology/STEMI.   Signed, Christell Faith, PA-C [place] Pager: [phone] 05/10/2021, 2:38 PM

## 2021-05-10 NOTE — ED Notes (Signed)
Pt ambulated independently to BR with use of cane.

## 2021-05-10 NOTE — Assessment & Plan Note (Signed)
Continue Norvasc, HCTZ, lisinopril, metoprolol.  Adjust medicine as needed

## 2021-05-11 ENCOUNTER — Other Ambulatory Visit: Payer: Self-pay

## 2021-05-11 ENCOUNTER — Encounter
Admission: EM | Disposition: A | Discharge status: Home / Self Care | Payer: Self-pay | Source: Home / Self Care | Attending: Internal Medicine

## 2021-05-11 DIAGNOSIS — B20 Human immunodeficiency virus [HIV] disease: Secondary | ICD-10-CM | POA: Diagnosis present

## 2021-05-11 DIAGNOSIS — R42 Dizziness and giddiness: Secondary | ICD-10-CM | POA: Diagnosis present

## 2021-05-11 DIAGNOSIS — I214 Non-ST elevation (NSTEMI) myocardial infarction: Secondary | ICD-10-CM | POA: Diagnosis present

## 2021-05-11 DIAGNOSIS — I200 Unstable angina: Secondary | ICD-10-CM | POA: Diagnosis present

## 2021-05-11 DIAGNOSIS — H548 Legal blindness, as defined in USA: Secondary | ICD-10-CM | POA: Diagnosis present

## 2021-05-11 DIAGNOSIS — E669 Obesity, unspecified: Secondary | ICD-10-CM | POA: Diagnosis present

## 2021-05-11 DIAGNOSIS — F419 Anxiety disorder, unspecified: Secondary | ICD-10-CM | POA: Diagnosis present

## 2021-05-11 DIAGNOSIS — N1831 Chronic kidney disease, stage 3a: Secondary | ICD-10-CM | POA: Diagnosis present

## 2021-05-11 DIAGNOSIS — I252 Old myocardial infarction: Secondary | ICD-10-CM | POA: Diagnosis not present

## 2021-05-11 DIAGNOSIS — Z955 Presence of coronary angioplasty implant and graft: Secondary | ICD-10-CM | POA: Diagnosis not present

## 2021-05-11 DIAGNOSIS — I10 Essential (primary) hypertension: Secondary | ICD-10-CM | POA: Diagnosis not present

## 2021-05-11 DIAGNOSIS — Z880 Allergy status to penicillin: Secondary | ICD-10-CM | POA: Diagnosis not present

## 2021-05-11 DIAGNOSIS — I249 Acute ischemic heart disease, unspecified: Secondary | ICD-10-CM | POA: Diagnosis present

## 2021-05-11 DIAGNOSIS — Z8249 Family history of ischemic heart disease and other diseases of the circulatory system: Secondary | ICD-10-CM | POA: Diagnosis not present

## 2021-05-11 DIAGNOSIS — K219 Gastro-esophageal reflux disease without esophagitis: Secondary | ICD-10-CM | POA: Diagnosis present

## 2021-05-11 DIAGNOSIS — Z20822 Contact with and (suspected) exposure to covid-19: Secondary | ICD-10-CM | POA: Diagnosis present

## 2021-05-11 DIAGNOSIS — Z85828 Personal history of other malignant neoplasm of skin: Secondary | ICD-10-CM | POA: Diagnosis not present

## 2021-05-11 DIAGNOSIS — G609 Hereditary and idiopathic neuropathy, unspecified: Secondary | ICD-10-CM | POA: Diagnosis present

## 2021-05-11 DIAGNOSIS — Z7982 Long term (current) use of aspirin: Secondary | ICD-10-CM | POA: Diagnosis not present

## 2021-05-11 DIAGNOSIS — I129 Hypertensive chronic kidney disease with stage 1 through stage 4 chronic kidney disease, or unspecified chronic kidney disease: Secondary | ICD-10-CM | POA: Diagnosis present

## 2021-05-11 DIAGNOSIS — I2510 Atherosclerotic heart disease of native coronary artery without angina pectoris: Secondary | ICD-10-CM | POA: Diagnosis not present

## 2021-05-11 DIAGNOSIS — Z91041 Radiographic dye allergy status: Secondary | ICD-10-CM | POA: Diagnosis not present

## 2021-05-11 DIAGNOSIS — Z79899 Other long term (current) drug therapy: Secondary | ICD-10-CM | POA: Diagnosis not present

## 2021-05-11 DIAGNOSIS — E782 Mixed hyperlipidemia: Secondary | ICD-10-CM | POA: Diagnosis present

## 2021-05-11 DIAGNOSIS — Z6830 Body mass index (BMI) 30.0-30.9, adult: Secondary | ICD-10-CM | POA: Diagnosis not present

## 2021-05-11 DIAGNOSIS — I25110 Atherosclerotic heart disease of native coronary artery with unstable angina pectoris: Secondary | ICD-10-CM | POA: Diagnosis present

## 2021-05-11 HISTORY — PX: CORONARY PRESSURE/FFR STUDY: CATH118243

## 2021-05-11 HISTORY — PX: CORONARY STENT INTERVENTION: CATH118234

## 2021-05-11 HISTORY — PX: INTRAVASCULAR PRESSURE WIRE/FFR STUDY: CATH118243

## 2021-05-11 HISTORY — PX: LEFT HEART CATH AND CORONARY ANGIOGRAPHY: CATH118249

## 2021-05-11 HISTORY — PX: INTRAVASCULAR ULTRASOUND/IVUS: CATH118244

## 2021-05-11 HISTORY — PX: CORONARY ULTRASOUND/IVUS: CATH118244

## 2021-05-11 LAB — ECHOCARDIOGRAM COMPLETE
Area-P 1/2: 3.42 cm2
S' Lateral: 2.55 cm
Weight: 3392 oz

## 2021-05-11 LAB — BASIC METABOLIC PANEL
Anion gap: 7 (ref 5–15)
BUN: 17 mg/dL (ref 8–23)
CO2: 25 mmol/L (ref 22–32)
Calcium: 9.2 mg/dL (ref 8.9–10.3)
Chloride: 101 mmol/L (ref 98–111)
Creatinine, Ser: 1.23 mg/dL (ref 0.61–1.24)
GFR, Estimated: >60 mL/min (ref >60)
Glucose, Bld: 168 mg/dL — ABNORMAL HIGH (ref 70–99)
Potassium: 4.1 mmol/L (ref 3.5–5.1)
Sodium: 133 mmol/L — ABNORMAL LOW (ref 135–145)

## 2021-05-11 LAB — CBC
HCT: 47 % (ref 39.0–52.0)
Hemoglobin: 15.4 g/dL (ref 13.0–17.0)
MCH: 25.7 pg — ABNORMAL LOW (ref 26.0–34.0)
MCHC: 32.8 g/dL (ref 30.0–36.0)
MCV: 78.5 fL — ABNORMAL LOW (ref 80.0–100.0)
Platelets: 370 10*3/uL (ref 150–400)
RBC: 5.99 MIL/uL — ABNORMAL HIGH (ref 4.22–5.81)
RDW: 12.9 % (ref 11.5–15.5)
WBC: 7.5 10*3/uL (ref 4.0–10.5)
nRBC: 0 % (ref 0.0–0.2)

## 2021-05-11 LAB — POCT ACTIVATED CLOTTING TIME
Activated Clotting Time: 300 seconds
Activated Clotting Time: 312 seconds

## 2021-05-11 SURGERY — LEFT HEART CATH AND CORONARY ANGIOGRAPHY
Anesthesia: Moderate Sedation

## 2021-05-11 MED ORDER — CLOPIDOGREL BISULFATE 75 MG PO TABS
ORAL_TABLET | ORAL | Status: DC | PRN
  Administered 2021-05-11: 600 mg via ORAL

## 2021-05-11 MED ORDER — SODIUM CHLORIDE 0.9% FLUSH: 3.0000 mL | INTRAVENOUS | Status: DC | PRN

## 2021-05-11 MED ORDER — MIDAZOLAM HCL 2 MG/2ML IJ SOLN
INTRAMUSCULAR | Status: DC | PRN
  Administered 2021-05-11 (×2): 1 mg via INTRAVENOUS

## 2021-05-11 MED ORDER — HYDRALAZINE HCL 20 MG/ML IJ SOLN: 10.0000 mg | INTRAMUSCULAR | Status: AC | PRN

## 2021-05-11 MED ORDER — SODIUM CHLORIDE 0.9% FLUSH
3.0000 mL | Freq: Two times a day (BID) | INTRAVENOUS | Status: DC
  Administered 2021-05-11 – 2021-05-12 (×2): 3 mL via INTRAVENOUS

## 2021-05-11 MED ORDER — PRASUGREL HCL 10 MG PO TABS
10.0000 mg | ORAL_TABLET | Freq: Every day | ORAL | Status: DC
  Administered 2021-05-12: 10 mg via ORAL
  Filled 2021-05-11: qty 1

## 2021-05-11 MED ORDER — LIDOCAINE HCL (PF) 1 % IJ SOLN
INTRAMUSCULAR | Status: DC | PRN
  Administered 2021-05-11: 2 mL

## 2021-05-11 MED ORDER — PRASUGREL HCL 10 MG PO TABS
ORAL_TABLET | ORAL | Status: AC
  Filled 2021-05-11: qty 6

## 2021-05-11 MED ORDER — IOHEXOL 350 MG/ML SOLN
INTRAVENOUS | Status: DC | PRN
  Administered 2021-05-11: 105 mL

## 2021-05-11 MED ORDER — HEPARIN (PORCINE) IN NACL 1000-0.9 UT/500ML-% IV SOLN
INTRAVENOUS | Status: AC
  Filled 2021-05-11: qty 1000

## 2021-05-11 MED ORDER — FENTANYL CITRATE (PF) 100 MCG/2ML IJ SOLN
INTRAMUSCULAR | Status: AC
  Filled 2021-05-11: qty 2

## 2021-05-11 MED ORDER — NITROGLYCERIN 0.4 MG SL SUBL
SUBLINGUAL_TABLET | SUBLINGUAL | Status: DC | PRN
  Administered 2021-05-11: .4 mg via SUBLINGUAL

## 2021-05-11 MED ORDER — NITROGLYCERIN 1 MG/10 ML FOR IR/CATH LAB
INTRA_ARTERIAL | Status: AC
  Filled 2021-05-11: qty 10

## 2021-05-11 MED ORDER — HEPARIN SODIUM (PORCINE) 1000 UNIT/ML IJ SOLN
INTRAMUSCULAR | Status: DC | PRN
  Administered 2021-05-11: 5000 [IU] via INTRAVENOUS
  Administered 2021-05-11: 2000 [IU] via INTRAVENOUS
  Administered 2021-05-11: 5000 [IU] via INTRAVENOUS

## 2021-05-11 MED ORDER — VERAPAMIL HCL 2.5 MG/ML IV SOLN
INTRAVENOUS | Status: AC
  Filled 2021-05-11: qty 2

## 2021-05-11 MED ORDER — LIDOCAINE HCL 1 % IJ SOLN
INTRAMUSCULAR | Status: AC
  Filled 2021-05-11: qty 20

## 2021-05-11 MED ORDER — CLOPIDOGREL BISULFATE 75 MG PO TABS
ORAL_TABLET | ORAL | Status: AC
  Filled 2021-05-11: qty 8

## 2021-05-11 MED ORDER — NITROGLYCERIN 0.4 MG SL SUBL
SUBLINGUAL_TABLET | SUBLINGUAL | Status: AC
  Filled 2021-05-11: qty 1

## 2021-05-11 MED ORDER — HEPARIN SODIUM (PORCINE) 1000 UNIT/ML IJ SOLN
INTRAMUSCULAR | Status: AC
  Filled 2021-05-11: qty 1

## 2021-05-11 MED ORDER — SODIUM CHLORIDE 0.9 % IV SOLN: INTRAVENOUS | Status: AC

## 2021-05-11 MED ORDER — MIDAZOLAM HCL 2 MG/2ML IJ SOLN
INTRAMUSCULAR | Status: AC
  Filled 2021-05-11: qty 2

## 2021-05-11 MED ORDER — NITROGLYCERIN 1 MG/10 ML FOR IR/CATH LAB
INTRA_ARTERIAL | Status: DC | PRN
  Administered 2021-05-11: 200 ug via INTRACORONARY
  Administered 2021-05-11: 100 ug via INTRACORONARY
  Administered 2021-05-11 (×2): 200 ug via INTRACORONARY

## 2021-05-11 MED ORDER — LABETALOL HCL 5 MG/ML IV SOLN: 10.0000 mg | INTRAVENOUS | Status: AC | PRN

## 2021-05-11 MED ORDER — VERAPAMIL HCL 2.5 MG/ML IV SOLN
INTRAVENOUS | Status: DC | PRN
  Administered 2021-05-11: 2.5 mg via INTRA_ARTERIAL

## 2021-05-11 MED ORDER — FENTANYL CITRATE (PF) 100 MCG/2ML IJ SOLN
INTRAMUSCULAR | Status: DC | PRN
  Administered 2021-05-11 (×2): 25 ug via INTRAVENOUS
  Administered 2021-05-11: 50 ug via INTRAVENOUS

## 2021-05-11 MED ORDER — ENOXAPARIN SODIUM 40 MG/0.4ML IJ SOSY
40.0000 mg | PREFILLED_SYRINGE | INTRAMUSCULAR | Status: DC
  Administered 2021-05-12: 40 mg via SUBCUTANEOUS
  Filled 2021-05-11 (×3): qty 0.4

## 2021-05-11 MED ORDER — SODIUM CHLORIDE 0.9 % IV SOLN: 250.0000 mL | INTRAVENOUS | Status: DC | PRN

## 2021-05-11 MED ORDER — PRASUGREL HCL 10 MG PO TABS
ORAL_TABLET | ORAL | Status: DC | PRN
  Administered 2021-05-11: 60 mg via ORAL

## 2021-05-11 SURGICAL SUPPLY — 24 items
BALLN TREK RX 2.75X12 (BALLOONS) ×2
BALLN ~~LOC~~ TREK RX 3.5X15 (BALLOONS) ×2
BALLN ~~LOC~~ TREK RX 4.0X12 (BALLOONS) ×2
BALLN ~~LOC~~ TREK RX 4.0X8 (BALLOONS) ×2
BALLOON TREK RX 2.75X12 (BALLOONS) IMPLANT
BALLOON ~~LOC~~ TREK RX 3.5X15 (BALLOONS) IMPLANT
BALLOON ~~LOC~~ TREK RX 4.0X12 (BALLOONS) IMPLANT
BALLOON ~~LOC~~ TREK RX 4.0X8 (BALLOONS) IMPLANT
CATH 5F 110X4 TIG (CATHETERS) ×1 IMPLANT
CATH EAGLE EYE PLAT IMAGING (CATHETERS) ×1 IMPLANT
CATH INFINITI 5 FR JL3.5 (CATHETERS) ×1 IMPLANT
CATH VISTA GUIDE 6FR XBLAD3.5 (CATHETERS) ×1 IMPLANT
DEVICE RAD TR BAND REGULAR (VASCULAR PRODUCTS) ×1 IMPLANT
DRAPE BRACHIAL (DRAPES) ×1 IMPLANT
GLIDESHEATH SLEND SS 6F .021 (SHEATH) ×1 IMPLANT
GUIDEWIRE INQWIRE 1.5J.035X260 (WIRE) IMPLANT
GUIDEWIRE PRESS OMNI 185 ST (WIRE) ×1 IMPLANT
INQWIRE 1.5J .035X260CM (WIRE) ×2
KIT ENCORE 26 ADVANTAGE (KITS) ×1 IMPLANT
PACK CARDIAC CATH (CUSTOM PROCEDURE TRAY) ×2 IMPLANT
PROTECTION STATION PRESSURIZED (MISCELLANEOUS) ×2
SET ATX SIMPLICITY (MISCELLANEOUS) ×1 IMPLANT
STATION PROTECTION PRESSURIZED (MISCELLANEOUS) IMPLANT
STENT ONYX FRONTIER 3.0X22 (Permanent Stent) ×1 IMPLANT

## 2021-05-11 NOTE — Interval H&P Note (Signed)
History and Physical Interval Note:  05/11/2021 12:22 PM  Christopher Hart.  has presented today for surgery, with the diagnosis of non ST segment myocardial infarction.  The various methods of treatment have been discussed with the patient and family. After consideration of risks, benefits and other options for treatment, the patient has consented to  Procedure(s): LEFT HEART CATH AND CORONARY ANGIOGRAPHY (N/A) as a surgical intervention.  The patient's history has been reviewed, patient examined, no change in status, stable for surgery.  I have reviewed the patient's chart and labs.  Questions were answered to the patient's satisfaction.    Cath Lab Visit (complete for each Cath Lab visit)  Clinical Evaluation Leading to the Procedure:   ACS: Yes.    Non-ACS:  N/A  Christopher Hart

## 2021-05-11 NOTE — Progress Notes (Signed)
Progress Note  Patient Name: Christopher Hart. Date of Encounter: 05/11/2021  Primary Cardiologist: Johnny Bridge, MD   Subjective   He is chest pain free and awaiting to go for his cath.   Inpatient Medications    Scheduled Meds:  amLODipine  5 mg Oral Daily   aspirin  81 mg Oral Daily   atorvastatin  20 mg Oral Daily   diphenhydrAMINE  50 mg Oral Once   Or   diphenhydrAMINE  50 mg Intravenous Once   efavirenz-emtricitabine-tenofovir  1 tablet Oral QHS   enoxaparin (LOVENOX) injection  1 mg/kg Subcutaneous Q28M   folic acid  1 mg Oral Daily   hydrochlorothiazide  25 mg Oral Daily   lisinopril  40 mg Oral Daily   metoprolol tartrate  50 mg Oral BID   nortriptyline  20 mg Oral QHS   pantoprazole  40 mg Oral Daily   sodium chloride flush  3 mL Intravenous Q12H   vortioxetine HBr  20 mg Oral Daily   Continuous Infusions:  sodium chloride     sodium chloride 1 mL/kg/hr (05/11/21 0557)   PRN Meds: sodium chloride, acetaminophen **OR** acetaminophen, bisacodyl, HYDROcodone-acetaminophen, LORazepam, meclizine, metoprolol tartrate, morphine injection, nitroGLYCERIN, ondansetron **OR** ondansetron (ZOFRAN) IV, senna-docusate, sodium chloride flush   Vital Signs    Vitals:   05/11/21 0119 05/11/21 0449 05/11/21 0810 05/11/21 1101  BP: 115/65 121/79 128/80 128/84  Pulse: 76 70 83 74  Resp: 13 14 18 17   Temp: 98.6 F (37 C) (!) 97.5 F (36.4 C) (!) 97.5 F (36.4 C) 97.7 F (36.5 C)  TempSrc: Oral     SpO2: 98% 99% 98% 100%  Weight:  97.9 kg    Height:    5\' 10"  (1.778 m)   No intake or output data in the 24 hours ending 05/11/21 1131 Filed Weights   05/10/21 1557 05/11/21 0449  Weight: 96.2 kg 97.9 kg    Physical Exam   GEN: obese, in no acute distress.  HEENT: Grossly normal.  Neck: Supple, no JVD, carotid bruits, or masses. Cardiac: RRR, no murmurs, rubs, or gallops. No clubbing, cyanosis, edema.  Radials 2+, DP/PT 2+ and equal bilaterally.  Respiratory:   Respirations regular and unlabored, clear to auscultation bilaterally. GI: obese. Soft, nontender, nondistended, BS + x 4. MS: no deformity or atrophy. Skin: warm and dry, no rash. Neuro:  Strength and sensation are intact. Psych: AAOx3.  Normal affect.  Labs    Chemistry Recent Labs  Lab 05/09/21 1243 05/10/21 0634 05/11/21 0551  NA 135 131* 133*  K 3.7 4.0 4.1  CL 105 102 101  CO2 24 21* 25  GLUCOSE 127* 109* 168*  BUN 13 13 17   CREATININE 1.15 1.23 1.23  CALCIUM 8.8* 8.9 9.2  GFRNONAA >60 >60 >60  ANIONGAP 6 8 7      Hematology Recent Labs  Lab 05/09/21 1243 05/10/21 0634 05/11/21 0551  WBC 8.9 11.4* 7.5  RBC 5.56 5.77 5.99*  HGB 14.4 15.0 15.4  HCT 44.3 45.6 47.0  MCV 79.7* 79.0* 78.5*  MCH 25.9* 26.0 25.7*  MCHC 32.5 32.9 32.8  RDW 13.0 13.2 12.9  PLT 378 380 370    Cardiac Enzymes  Recent Labs  Lab 05/09/21 1243 05/09/21 1928 05/09/21 2121 05/10/21 0000 05/10/21 0335  TROPONINIHS 80* 417* 412* 390* 335*     Lipids  Lab Results  Component Value Date   CHOL 123 05/10/2021   HDL 28 (L) 05/10/2021   LDLCALC 70  05/10/2021   TRIG 123 05/10/2021   CHOLHDL 4.4 05/10/2021    HbA1c  Lab Results  Component Value Date   HGBA1C 5.5 06/12/2017    Radiology    --------------------  Telemetry    Normal Sinus Rhythm. HR 74   - Personally Reviewed  Cardiac Studies   2D Echocardiogram 10.4.2022  1. Left ventricular ejection fraction, by estimation, is 65 to 70%. The  left ventricle has normal function. The left ventricle has no regional  wall motion abnormalities. There is mild left ventricular hypertrophy.  Left ventricular diastolic parameters  are consistent with Grade I diastolic dysfunction (impaired relaxation).   2. Right ventricular systolic function is normal. The right ventricular  size is normal. There is normal pulmonary artery systolic pressure.   3. The mitral valve is grossly normal. Trivial mitral valve  regurgitation. No  evidence of mitral stenosis.   4. The aortic valve is tricuspid. Aortic valve regurgitation is not  visualized. No aortic stenosis is present.   5. The inferior vena cava is normal in size with greater than 50%  respiratory variability, suggesting right atrial pressure of 3 mmHg.   6. Faint Doppler signal noted near the interatrial septum; this is most  likely artifact but a tinly left-to-right shunt cannot be excluded.   Patient Profile     60 y.o. male with past medical history of CAD with NSTEMI in 06/2017, HIV, HTN, HLD, Bell's palsy in 12/2020, vertigo, subependymoma mass stable since 2018, peripheral neuropathy, depression, anxiety, and obesity being evaluated for elevated troponin.   Assessment & Plan    1. CAD: Cardiac Catheretization in 2018 showed culprit occluded mid Cx lesion with recommendation of medical management. Presented to ED with Left chest pain 10/10 that radiated to his left shoulder/elbow with exertion intermittently over 3 weeks. Echo 10/4  EF 65-70% with normal LV function.  -Peak HsTrop @ 417 -Currently chest pain free -Continue ASA, ? blocker, and atorvastatin  -Plan on LHC today -Risks and benefits of cardiac cath have been discussed with the patient including risks of bleeding, bruising, infection, kidney damage, stroke, heart attack, urgent need for bypass, and death. The patient understands these risks and is willing to proceed with the procedure. All questions have been answered and concerns listened to.  -Previous noted contrast allergy, premedicated with Benadryl  -PRN SL NTG and morphine   2. Hypertension: -Blood pressure well controlled  - Continue amlodipine, lisinopril/ HCTZ, and metoprolol  3. HLD: -LDL 70 this admission -Continue atorvastatin   4.  HIV:  -home meds per IM.  Signed, Murray Hodgkins, NP  05/11/2021, 11:31 AM    For questions or updates, please contact   Please consult [email or web address] for contact info under Cardiology/STEMI.

## 2021-05-11 NOTE — Progress Notes (Signed)
PROGRESS NOTE  Christopher Hart. YQI:[id number] DOB: 02/08/1961 DOA: 05/09/2021 PCP: Gladstone Lighter, MD  Brief History   60 y.o. male with medical history significant for coronary artery disease (NSTEMI and cardiac cath 06/13/17 but no stents placed and is not followed by a cardiologist), HIV, hypertension, hyperlipidemia, Bell's palsy with residual facial droop, GERD, chronic difficulty walking admitted for chest pain and elevated troponins.  Patient is demanding food or wanting to leave.  Pt underwent LHC today. It demonstrated Significant three-vessel coronary artery disease including sequential 50% proximal, 80% mid, 40% mid, and 70% distal LAD stenoses, diffusely diseased small distal LCx and OM2 with up to 90% stenosis, and sequential 50% ostial/proximal RCA, 40-50% distal RCA, and 90% distal RPAV lesions. Normal left ventricular filling pressure (LVEDP 10-15 mmHg). Successful iFR and IVUS guided PCI to 80% mid LAD stenosis using Onyx Frontier 3.0 x 22 mm drug-eluting stent (postdilated up to 4.1 mm proximally) with 0% residual stenosis and TIMI-3.  Small D2 branch was jailed by the stent with reduction in flow from TIMI-2 to TIMI-0 and collateralization via left-to-left collaterals.  Recommendations from cardiology include DAPT with ASA and prasugrel for 12 months, high intensity statin therapy, medical mgmt of LAD, Lt Cx, and RCA which was noncritical or too small/distal for intervention.  Consultants  Cardiology Interventional cardiology  Procedures  LHC  Antibiotics   Anti-infectives (From admission, onward)    Start     Dose/Rate Route Frequency Ordered Stop   05/09/21 2200  efavirenz-emtricitabine-tenofovir (ATRIPLA) 600-200-300 MG per tablet 1 tablet        1 tablet Oral Daily at bedtime 05/09/21 2024          Subjective  Pt is seem post LHC. No new complaints.  Objective   Vitals:  Vitals:   05/11/21 1600 05/11/21 1700  BP: (!) 147/71 133/82  Pulse: 74 91   Resp: 17 16  Temp:    SpO2: 99% 100%    Exam:  Constitutional:  Somnolent. No acute distress. Respiratory:  CTA bilaterally, no w/r/r.  Respiratory effort normal. No retractions or accessory muscle use Cardiovascular:  RRR, no m/r/g No LE extremity edema   Normal pedal pulses Abdomen:  Abdomen appears normal; no tenderness or masses No hernias No HSM Musculoskeletal:  Digits/nails BUE: no clubbing, cyanosis, petechiae, infection exam of joints, bones, muscles of at least one of following: head/neck, RUE, LUE, RLE, LLE   Skin:  No rashes, lesions, ulcers palpation of skin: no induration or nodules  I have personally reviewed the following:   Today's Data  Vitals  Lab Data  CBC, BMP  Cardiology Data  EKG Echocardiogram LHC  Scheduled Meds:  amLODipine  5 mg Oral Daily   aspirin  81 mg Oral Daily   atorvastatin  20 mg Oral Daily   efavirenz-emtricitabine-tenofovir  1 tablet Oral QHS   [START ON 05/12/2021] enoxaparin (LOVENOX) injection  40 mg Subcutaneous L87F   folic acid  1 mg Oral Daily   hydrochlorothiazide  25 mg Oral Daily   lisinopril  40 mg Oral Daily   metoprolol tartrate  50 mg Oral BID   nortriptyline  20 mg Oral QHS   pantoprazole  40 mg Oral Daily   [START ON 05/12/2021] prasugrel  10 mg Oral Daily   sodium chloride flush  3 mL Intravenous Q12H   vortioxetine HBr  20 mg Oral Daily   Continuous Infusions:  sodium chloride 125 mL/hr at 05/11/21 1710   sodium chloride  Principal Problem:   NSTEMI (non-ST elevated myocardial infarction) ([place]) Active Problems:   Primary hypertension   HIV disease ([place])   Mixed hyperlipidemia   GERD without esophagitis   Coronary artery disease involving native coronary artery of native heart with unstable angina pectoris ([place])   Anxiety   ACS (acute coronary syndrome) ([place])   LOS: 0 days   A & P  NSTEMI (non-ST elevated myocardial infarction) ([facility]) Troponin peaked at 417.  Cath scheduled for  tomorrow.  Continue aspirin, statin, beta-blocker, lisinopril and nitro as needed.  Lovenox subcu twice daily   Coronary artery disease involving native coronary artery of native heart with unstable angina pectoris [facility]) Cardiac cath planned for tomorrow by cardiology.  Continue aspirin, Lipitor, lisinopril, metoprolol and nitro.  Full dose Lovenox.  Patient is chest pain-free at this time. LHC on 05/11/2021 demonstrated significant three-vessel coronary artery disease including sequential 50% proximal, 80% mid, 40% mid, and 70% distal LAD stenoses, diffusely diseased small distal LCx and OM2 with up to 90% stenosis, and sequential 50% ostial/proximal RCA, 40-50% distal RCA, and 90% distal RPAV lesions. Normal left ventricular filling pressure (LVEDP 10-15 mmHg). Successful iFR and IVUS guided PCI to 80% mid LAD stenosis using Onyx Frontier 3.0 x 22 mm drug-eluting stent (postdilated up to 4.1 mm proximally) with 0% residual stenosis and TIMI-3.  Small D2 branch was jailed by the stent with reduction in flow from TIMI-2 to TIMI-0 and collateralization via left-to-left collaterals. Recommendations from cardiology include DAPT with ASA and prasugrel for 12 months, high intensity statin therapy, medical mgmt of LAD, Lt Cx, and RCA which was noncritical or too small/distal for intervention.  Primary hypertension Continue Norvasc, HCTZ, lisinopril, metoprolol.  Adjust medicine as needed  DVT prophylaxis: Lovenox Code Status: Full Code Family Communication: None available Disposition Plan: home    Christopher Height, DO [facility] Direct contact: see [email or web address]  7PM-7AM contact night coverage as above 05/11/2021, 8:00 PM  LOS: 0 days

## 2021-05-12 ENCOUNTER — Telehealth: Payer: Self-pay | Admitting: Physician Assistant

## 2021-05-12 ENCOUNTER — Encounter: Payer: Self-pay | Admitting: Internal Medicine

## 2021-05-12 DIAGNOSIS — E782 Mixed hyperlipidemia: Secondary | ICD-10-CM

## 2021-05-12 DIAGNOSIS — I25110 Atherosclerotic heart disease of native coronary artery with unstable angina pectoris: Secondary | ICD-10-CM

## 2021-05-12 DIAGNOSIS — B20 Human immunodeficiency virus [HIV] disease: Secondary | ICD-10-CM

## 2021-05-12 DIAGNOSIS — I214 Non-ST elevation (NSTEMI) myocardial infarction: Secondary | ICD-10-CM | POA: Diagnosis not present

## 2021-05-12 LAB — BASIC METABOLIC PANEL
Anion gap: 7 (ref 5–15)
BUN: 21 mg/dL (ref 8–23)
CO2: 24 mmol/L (ref 22–32)
Calcium: 9 mg/dL (ref 8.9–10.3)
Chloride: 104 mmol/L (ref 98–111)
Creatinine, Ser: 1.08 mg/dL (ref 0.61–1.24)
GFR, Estimated: >60 mL/min (ref >60)
Glucose, Bld: 118 mg/dL — ABNORMAL HIGH (ref 70–99)
Potassium: 3.6 mmol/L (ref 3.5–5.1)
Sodium: 135 mmol/L (ref 135–145)

## 2021-05-12 LAB — CBC
HCT: 45.5 % (ref 39.0–52.0)
Hemoglobin: 15.4 g/dL (ref 13.0–17.0)
MCH: 26.9 pg (ref 26.0–34.0)
MCHC: 33.8 g/dL (ref 30.0–36.0)
MCV: 79.4 fL — ABNORMAL LOW (ref 80.0–100.0)
Platelets: 391 10*3/uL (ref 150–400)
RBC: 5.73 MIL/uL (ref 4.22–5.81)
RDW: 13 % (ref 11.5–15.5)
WBC: 14.3 10*3/uL — ABNORMAL HIGH (ref 4.0–10.5)
nRBC: 0 % (ref 0.0–0.2)

## 2021-05-12 MED ORDER — ATORVASTATIN CALCIUM 80 MG PO TABS
80.0000 mg | ORAL_TABLET | Freq: Every day | ORAL | 0 refills | Status: DC
Start: 2021-05-12 — End: 2021-09-23

## 2021-05-12 MED ORDER — NITROGLYCERIN 0.4 MG SL SUBL: 0.4000 mg | SUBLINGUAL_TABLET | SUBLINGUAL | 12 refills | Status: DC | PRN

## 2021-05-12 MED ORDER — ATORVASTATIN CALCIUM 80 MG PO TABS: 80.0000 mg | ORAL_TABLET | Freq: Every day | ORAL | Status: DC

## 2021-05-12 MED ORDER — PRASUGREL HCL 10 MG PO TABS: 10.0000 mg | ORAL_TABLET | Freq: Every day | ORAL | 0 refills | Status: DC

## 2021-05-12 NOTE — Progress Notes (Addendum)
Progress Note  Patient Name: Christopher Hart. Date of Encounter: 05/12/2021  Primary Cardiologist: Ida Rogue, MD  Subjective   Feels well this AM.  No chest pain or sob.  Eager to go home.  Inpatient Medications    Scheduled Meds:  amLODipine  5 mg Oral Daily   aspirin  81 mg Oral Daily   atorvastatin  80 mg Oral Daily   efavirenz-emtricitabine-tenofovir  1 tablet Oral QHS   enoxaparin (LOVENOX) injection  40 mg Subcutaneous J57S   folic acid  1 mg Oral Daily   hydrochlorothiazide  25 mg Oral Daily   lisinopril  40 mg Oral Daily   metoprolol tartrate  50 mg Oral BID   nortriptyline  20 mg Oral QHS   pantoprazole  40 mg Oral Daily   prasugrel  10 mg Oral Daily   sodium chloride flush  3 mL Intravenous Q12H   vortioxetine HBr  20 mg Oral Daily   Continuous Infusions:  sodium chloride     PRN Meds: sodium chloride, acetaminophen **OR** acetaminophen, bisacodyl, HYDROcodone-acetaminophen, LORazepam, meclizine, morphine injection, nitroGLYCERIN, ondansetron **OR** ondansetron (ZOFRAN) IV, senna-docusate, sodium chloride flush   Vital Signs    Vitals:   05/11/21 2357 05/12/21 0457 05/12/21 0500 05/12/21 0806  BP: 129/79 130/88  137/81  Pulse: 83 77  75  Resp: (!) 22 20  20   Temp: 97.7 F (36.5 C) 98 F (36.7 C)  98 F (36.7 C)  TempSrc:    Oral  SpO2: 99% 99%  99%  Weight:   96.9 kg   Height:        Intake/Output Summary (Last 24 hours) at 05/12/2021 0906 Last data filed at 05/12/2021 0807 Gross per 24 hour  Intake 240 ml  Output 950 ml  Net -710 ml   Filed Weights   05/10/21 1557 05/11/21 0449 05/12/21 0500  Weight: 96.2 kg 97.9 kg 96.9 kg    Physical Exam   GEN: Obese, in no acute distress.  HEENT: Grossly normal.  Neck: Supple, no JVD, carotid bruits, or masses. Cardiac: RRR, no murmurs, rubs, or gallops. No clubbing, cyanosis, edema.  Radials 2+, DP/PT 2+ and equal bilaterally. R radial cath site without bleeding, bruit, or  hematoma. Respiratory:  Respirations regular and unlabored, clear to auscultation bilaterally. GI: Soft, nontender, nondistended, BS + x 4. MS: no deformity or atrophy. Skin: warm and dry, no rash. Neuro:  Strength and sensation are intact. Psych: AAOx3.  Normal affect.  Labs    Chemistry Recent Labs  Lab 05/10/21 0634 05/11/21 0551 05/12/21 0514  NA 131* 133* 135  K 4.0 4.1 3.6  CL 102 101 104  CO2 21* 25 24  GLUCOSE 109* 168* 118*  BUN 13 17 21   CREATININE 1.23 1.23 1.08  CALCIUM 8.9 9.2 9.0  GFRNONAA >60 >60 >60  ANIONGAP 8 7 7      Hematology Recent Labs  Lab 05/10/21 0634 05/11/21 0551 05/12/21 0514  WBC 11.4* 7.5 14.3*  RBC 5.77 5.99* 5.73  HGB 15.0 15.4 15.4  HCT 45.6 47.0 45.5  MCV 79.0* 78.5* 79.4*  MCH 26.0 25.7* 26.9  MCHC 32.9 32.8 33.8  RDW 13.2 12.9 13.0  PLT 380 370 391    Cardiac Enzymes  Recent Labs  Lab 05/09/21 1243 05/09/21 1928 05/09/21 2121 05/10/21 0000 05/10/21 0335  TROPONINIHS 80* 417* 412* 390* 335*    Lipids  Lab Results  Component Value Date   CHOL 123 05/10/2021   HDL 28 (L) 05/10/2021  LDLCALC 70 05/10/2021   TRIG 123 05/10/2021   CHOLHDL 4.4 05/10/2021    HbA1c  Lab Results  Component Value Date   HGBA1C 5.5 06/12/2017    Radiology    DG Chest 2 View  Result Date: 05/09/2021 CLINICAL DATA:  Chest pain. EXAM: CHEST - 2 VIEW COMPARISON:  January 09, 2021. FINDINGS: The heart size and mediastinal contours are within normal limits. Both lungs are clear. The visualized skeletal structures are unremarkable. IMPRESSION: No active cardiopulmonary disease. Electronically Signed   By: Marijo Conception M.D.   On: 05/09/2021 14:16   Telemetry    Regular sinus rhythm, no significant ectopy- Personally Reviewed  Cardiac Studies   2D Echocardiogram 10.4.2022  1. Left ventricular ejection fraction, by estimation, is 65 to 70%. The  left ventricle has normal function. The left ventricle has no regional  wall motion  abnormalities. There is mild left ventricular hypertrophy.  Left ventricular diastolic parameters  are consistent with Grade I diastolic dysfunction (impaired relaxation).   2. Right ventricular systolic function is normal. The right ventricular  size is normal. There is normal pulmonary artery systolic pressure.   3. The mitral valve is grossly normal. Trivial mitral valve  regurgitation. No evidence of mitral stenosis.   4. The aortic valve is tricuspid. Aortic valve regurgitation is not  visualized. No aortic stenosis is present.   5. The inferior vena cava is normal in size with greater than 50%  respiratory variability, suggesting right atrial pressure of 3 mmHg.   6. Faint Doppler signal noted near the interatrial septum; this is most  likely artifact but a tinly left-to-right shunt cannot be excluded.  _____________   Percutaneous Coronary Intervention 10.5.2022  Left Main  Vessel is large.  Left Anterior Descending  Prox LAD lesion is 50% stenosed. Ultrasound (IVUS) was performed. Cross-sectional area: 9 mm. IVUS has determined that the lesion is eccentric and heterogeneous.  Prox LAD to Mid LAD lesion is 80% stenosed with 90% stenosed side branch in 2nd Diag. Pressure wire/FFR was performed on the lesion. Ultrasound (IVUS) was performed. Severe plaque burden was detected. IVUS has determined that the lesion is heterogeneous. iFR = 0.83.      **The proximal to mid LAD was successfully treated with a 3.0 x 22 mm Onyx frontier drug-eluting stent**  Mid LAD lesion is 40% stenosed.  Dist LAD lesion is 70% stenosed.  First Diagonal Branch  Vessel is large in size.  1st Diag lesion is 25% stenosed.  Second Diagonal Branch  Vessel is small in size.  Third Diagonal Branch  Vessel is small in size.  Ramus Intermedius  Vessel is moderate in size.  Left Circumflex  Vessel is large.  Dist Cx lesion is 90% stenosed with 70% stenosed side branch in 2nd Mrg. The lesion is irregular.   First Obtuse Marginal Branch  Vessel is large in size.  Second Obtuse Marginal Branch  Vessel is small in size.  Right Coronary Artery  Vessel is large.  Ost RCA to Prox RCA lesion is 50% stenosed. Lesion does not improve with intracoronary nitroglycerin.  Dist RCA lesion is 45% stenosed.  Right Posterior Descending Artery  Vessel is small in size.  Right Posterior Atrioventricular Artery  RPAV lesion is 90% stenosed.  First Right Posterolateral Branch  Vessel is small in size.  Second Right Posterolateral Branch  Vessel is small in size.  Third Right Posterolateral Branch  Vessel is small in size.    Patient Profile  60 year old man with history of coronary artery disease with NSTEMI in 06/2017 managed medically, his catheterization showed occlusion of small distal LCx with otherwise nonobstructive disease, hypertension, hyperlipidemia, HIV, and Bell's palsy, whom we have been asked to see due to NSTEMI.   Assessment & Plan    1.  NSTEMI/Coronary artery disease: Prior non-STEMI in 2018 with occluded mid left circumflex-medically managed.  Presented to the emergency department on October 3 with exertional chest and left shoulder pain.  Ruled in with peak troponin of 417.  Echo showed EF of 65 to 70%.  Diagnostic catheterization yesterday showed severe proximal - mid LAD disease, which was successfully treated with a drug-eluting stent.  He is chest pain-free this morning.  He has been ambulating without difficulty and is chest pain-free this morning.  In the setting of drug to drug interactions between his Atripla and Brilinta/Plavix, he has been placed on prasugrel therapy (risks versus benefits discussed at the time of catheterization in the setting of prior strokelike symptoms subsequently felt to be secondary to Bell's palsy).  His LDL is right at 70.  We have titrated his atorvastatin to 80 mg daily.  Continue aspirin and beta-blocker.  We will arrange for follow-up in  approximately 1 week.  2.  Essential hypertension: Blood pressure reasonable on beta-blocker, calcium channel blocker, ACE inhibitor, and diuretic.  3.  Hyperlipidemia: LDL 70.  Escalated atorvastatin to 80 mg daily.  4.  HIV: Continue home medications.  Signed, Murray Hodgkins, NP  05/12/2021, 9:06 AM    For questions or updates, please contact   Please consult [email or web address] for contact info under Cardiology/STEMI.

## 2021-05-12 NOTE — Progress Notes (Addendum)
Called to room by patient stating he had an emergency and had to leave hospital immediately. MD Swayze notified via secure chat and came to patient bedside. Patient discharged home via private vehicle. This RN assisted patient down to car at this time. Vitals WDL at time of discharge and instructions provided to patient regarding discharge summary and packet. Patient verbalized understanding prior to DC.

## 2021-05-12 NOTE — Telephone Encounter (Signed)
TCM....  Patient discharging from [facility]      They are scheduled to see Thurmond Butts 10-13 at 8   They were seen for cath NSTEMI  They need to be seen within 7 days      Please call

## 2021-05-12 NOTE — Telephone Encounter (Signed)
Lmov to confirm appt

## 2021-05-16 NOTE — Telephone Encounter (Signed)
Transition Care Management Follow-up Telephone Call Date of discharge and from where: 05/12/21 from [facility] How have you been since you were released from the hospital? "Feeling better" Some muscle pain in right arm at cath insertion site. Pt denies swelling, erythema or any other s/s associated. Pt taking Tylenol for pain.  Any questions or concerns? No  Items Reviewed: Did the pt receive and understand the discharge instructions provided? Yes  Medications obtained and verified? Yes  Other? No  Any new allergies since your discharge? No  Dietary orders reviewed? Yes - low sodium heart healthy / regular Do you have support at home? Yes   Home Care and Equipment/Supplies: Were home health services ordered? not applicable If so, what is the name of the agency? N/a  Has the agency set up a time to come to the patient's home? not applicable Were any new equipment or medical supplies ordered?  No What is the name of the medical supply agency? N/a Were you able to get the supplies/equipment? not applicable Do you have any questions related to the use of the equipment or supplies? N/a  Functional Questionnaire: (I = Independent and D = Dependent) ADLs: I  Bathing/Dressing- I  Meal Prep- I  Eating- I  Maintaining continence- I  Transferring/Ambulation- I with assistive device / Cane. Pt switched to left side to keep weight off of right arm.  Managing Meds- I  Follow up appointments reviewed:  PCP Hospital f/u appt confirmed? Follow up with PCP 7-10 days Specialist Hospital f/u appt confirmed? Yes  Scheduled to see Christell Faith, PA on 05/19/21 @ [facility] in [place]. Are transportation arrangements needed? No  If their condition worsens, is the pt aware to call Cardiologist or go to the Emergency Dept.? Yes Was the patient provided with contact information for the Cardiologist's office or ED? Yes Was to pt encouraged to call back with questions or concerns? Yes

## 2021-05-18 DIAGNOSIS — R262 Difficulty in walking, not elsewhere classified: Secondary | ICD-10-CM | POA: Diagnosis not present

## 2021-05-18 DIAGNOSIS — Z8669 Personal history of other diseases of the nervous system and sense organs: Secondary | ICD-10-CM | POA: Diagnosis not present

## 2021-05-18 DIAGNOSIS — R42 Dizziness and giddiness: Secondary | ICD-10-CM | POA: Diagnosis not present

## 2021-05-18 DIAGNOSIS — R519 Headache, unspecified: Secondary | ICD-10-CM | POA: Diagnosis not present

## 2021-05-18 DIAGNOSIS — Z961 Presence of intraocular lens: Secondary | ICD-10-CM | POA: Diagnosis not present

## 2021-05-18 NOTE — Progress Notes (Signed)
Cardiology Office Note    Date:  05/19/2021   ID:  [name], DOB [birth year], MRN [id number]  PCP:  Gladstone Lighter, MD  Cardiologist:  Ida Rogue, MD  Electrophysiologist:  None   Chief Complaint: Hospital follow-up  History of Present Illness:   Christopher Hart. is a 60 y.o. male with history of  CAD with NSTEMI in 06/2017 with cath recommending medical management as outlined below with recurrent NSTEMI in 05/2021 status post PCI to the LAD, HIV, HTN, HLD, Bell's palsy in 12/2020, vertigo, subpendymoma mass stable since 2018, peripheral neuropathy, depression, anxiety, and obesity who presents for hospital follow-up as outlined below.  Mr. Clouse was admitted to the hospital in 06/2017 with an NSTEMI.  Troponin peaked at 3.5.  Echo showed an EF of 60 to 65%, no regional wall motion abnormalities, normal LV diastolic function parameters, mild mitral regurgitation, mildly dilated left atrium, normal RV systolic function and PASP.  He did mention having a contrast allergy, and was premedicated.  Subsequent LHC showed a large LAD with 30 to 40% proximal/mid/distal disease, occluded mid/distal LCx, 30% proximal RCA stenosis, and 40% distal RCA stenosis.  Medical management was recommended.  Following discharge, he was lost to follow-up.   He was admitted to the hospital in 12/2020 with Bell's palsy and found to have a subpendymoma mass that was stable since 2018, which is followed by neurology.  MRI was negative for acute CVA.  He was admitted to the hospital from 10/3 through 10/6 with an NSTEMI.  High-sensitivity troponin peaked at 417.  Echo showed an EF of 65 to 70%, no regional wall motion abnormalities, mild LVH, grade 1 diastolic dysfunction, normal RV systolic function and ventricular cavity size, normal PASP, trivial mitral regurgitation, estimated right atrial pressure 3 mmHg.  After premedication for contrast allergy, he underwent LHC on 05/11/2021 which demonstrated  significant three-vessel CAD including sequential 50% proximal, 80% mid, 40% mid, and 70% distal LAD stenoses, diffusely diseased small distal LCx and OM 2 with up to 90% stenosis, and sequential 50% ostial/proximal RCA, 40 to 50% distal RCA, and 90% distal RPA V lesions.  Normal LV filling pressure.  He underwent successful IFR and IVUS guided PCI to the mid LAD.  A small D2 branch was jailed by the stent with with reduction in flow from TIMI II to TIMI 0 and collateralization via left to left collaterals.  Given potential interactions with a Atripla and clopidogrel as well as ticagrelor, prasugrel was felt to be the best option from a risk/benefit scenario.  Medical management of residual LAD, LCx, and RCA disease, which was noncritical or too small/distal for intervention, was recommended.  Discharge summary is unavailable for review.  He comes in accompanied by his husband today and is without symptoms concerning for angina or dyspnea.  No palpitations.  He notes chronic dizziness and gait unsteadiness which he feels like are progressive following his hospitalization.  No falls, hematochezia, or melena.  Ambulating with a cane.  He reports he only had 3 prescriptions available for pickup at the pharmacy following his discharge.  These were Effient, atorvastatin, and SL NTG.  He has been taking Effient and atorvastatin since his hospital discharge without issue.  He has not been taking a baby aspirin.  He is also not on any antihypertensive medications at this time.  Scleral injection is stable.   Labs independently reviewed: 05/2021 - Hgb 15.4, PLT 391, potassium 3.6, BUN 21, serum creatinine 1.08, TC  123, TG 123, HDL 28, LDL 70, magnesium 1.9 01/2021 - TSH normal 12/2020 - albumin 3.7, AST/ALT normal  Past Medical History:  Diagnosis Date   Allergic rhinitis    Anxiety    BCE (basal cell epithelioma)    Has had 23 basal cell skin cancer lesions   CAD (coronary artery disease)    a. Cath 06/13/17:  Mid Cx 100% stenosed-->Med Rx. 40% stenosis of dist RCA, distal LAD. 30% stenosis prox RCA, prox LAD; b. 05/2021 NSTEMI/PCI: LM nl, LAD 50p, 80p/m (iFR 0.83-->3.0x22 Onyx Frontier DES), 74m, 70d, D1 25, LCX 90d, OM2 70, RCA 50ost/p, 45d, RPAV 90.   Congenital cataract of both eyes    Initially legally blind, but sight improved with cataract removal   Diastolic dysfunction    a. 05/2021 Echo: EF 65-70%, no rwma, GRI DD, nl RV size/fxn, nl RVSP. Triv MR.   Difficulty walking    Idiopathic peripheral neuropathy.  Followed by neurologist.  EMG 01/20/2021: Abnormal.  Sensorimotor peripheral neuropathy with axonal features.  No conduction blocks.  No evidence of myopathy.   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   GERD (gastroesophageal reflux disease)    HIV infection (HCC)    Low viral load. Takes medication. Managed by primary care.   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 Sees neurologist, Dr. Melrose Nakayama.   Vertigo     Past Surgical History:  Procedure Laterality Date   Cataract surgery Bilateral    CORONARY STENT INTERVENTION N/A 05/11/2021   Procedure: CORONARY STENT INTERVENTION;  Surgeon: Nelva Bush, MD;  Location: [facility];  Service: Cardiovascular;  Laterality: N/A;   INTRAVASCULAR PRESSURE WIRE/FFR STUDY N/A 05/11/2021   Procedure: INTRAVASCULAR PRESSURE WIRE/FFR STUDY;  Surgeon: Nelva Bush, MD;  Location: [facility];  Service: Cardiovascular;  Laterality: N/A;   INTRAVASCULAR ULTRASOUND/IVUS N/A 05/11/2021   Procedure: Intravascular Ultrasound/IVUS;  Surgeon: Nelva Bush, MD;  Location: [facility];  Service: Cardiovascular;  Laterality: N/A;   LEFT HEART CATH AND CORONARY ANGIOGRAPHY N/A  06/13/2017   Procedure: LEFT HEART CATH AND CORONARY ANGIOGRAPHY;  Surgeon: Minna Merritts, MD;  Location: [facility];  Service: Cardiovascular;  Laterality: N/A;   LEFT HEART CATH AND CORONARY ANGIOGRAPHY N/A 05/11/2021   Procedure: LEFT HEART CATH AND CORONARY ANGIOGRAPHY;  Surgeon: Nelva Bush, MD;  Location: [facility];  Service: Cardiovascular;  Laterality: N/A;    Current Medications: Current Meds  Medication Sig   efavirenz-emtricitabine-tenofovir (ATRIPLA) 600-200-300 MG tablet Take 1 tablet by mouth at bedtime.   meclizine (ANTIVERT) 25 MG tablet Take 1 tablet (25 mg total) by mouth 3 (three) times daily as needed for dizziness or nausea.   metoprolol succinate (TOPROL XL) 25 MG 24 hr tablet Take 1 tablet (25 mg total) by mouth daily.   nitroGLYCERIN (NITROSTAT) 0.4 MG SL tablet Place 1 tablet (0.4 mg total) under the tongue every 5 (five) minutes as needed for chest pain.   ondansetron (ZOFRAN-ODT) 4 MG disintegrating tablet Take 4 mg by mouth every 8 (eight) hours as needed for nausea or vomiting.   [DISCONTINUED] amLODipine (NORVASC) 5 MG tablet Take 1 tablet by mouth daily.   [DISCONTINUED] metoprolol tartrate (LOPRESSOR) 50  MG tablet Take 50 mg by mouth 2 (two) times daily.   [DISCONTINUED] omeprazole (PRILOSEC) 20 MG capsule Take 20 mg by mouth daily.    Allergies:   Contrast media [iodinated diagnostic agents] and Penicillins   Social History   Socioeconomic History   Marital status: Single    Spouse name: Not on file   Number of children: Not on file   Years of education: Not on file   Highest education level: Not on file  Occupational History   Occupation: Unemployed   Tobacco Use   Smoking status: Never   Smokeless tobacco: Never  Vaping Use   Vaping Use: Never used  Substance and Sexual Activity   Alcohol use: Yes    Comment: rare   Drug use: No   Sexual activity: Not Currently  Other Topics Concern   Not on file  Social History  Narrative   Not on file   Social Determinants of Health   Financial Resource Strain: Not on file  Food Insecurity: Not on file  Transportation Needs: Not on file  Physical Activity: Not on file  Stress: Not on file  Social Connections: Not on file     Family History:  The patient's family history includes CAD in his maternal grandmother, mother, and paternal grandmother.  ROS:   Review of Systems  Constitutional:  Positive for malaise/fatigue. Negative for chills, diaphoresis, fever and weight loss.  HENT:  Negative for congestion.   Eyes:  Negative for discharge and redness.  Respiratory:  Negative for cough, sputum production, shortness of breath and wheezing.   Cardiovascular:  Negative for chest pain, palpitations, orthopnea, claudication, leg swelling and PND.  Gastrointestinal:  Negative for abdominal pain, blood in stool, heartburn, melena, nausea and vomiting.  Musculoskeletal:  Negative for falls and myalgias.  Skin:  Negative for rash.  Neurological:  Positive for dizziness. Negative for tingling, tremors, sensory change, speech change, focal weakness, loss of consciousness and weakness.       Gait unsteadiness  Endo/Heme/Allergies:  Does not bruise/bleed easily.  Psychiatric/Behavioral:  Negative for substance abuse. The patient is not nervous/anxious.   All other systems reviewed and are negative.   EKGs/Labs/Other Studies Reviewed:    Studies reviewed were summarized above. The additional studies were reviewed today:  LHC 05/11/2021: Conclusions: Significant three-vessel coronary artery disease including sequential 50% proximal, 80% mid, 40% mid, and 70% distal LAD stenoses, diffusely diseased small distal LCx and OM2 with up to 90% stenosis, and sequential 50% ostial/proximal RCA, 40-50% distal RCA, and 90% distal RPAV lesions. Normal left ventricular filling pressure (LVEDP 10-15 mmHg). Successful iFR and IVUS guided PCI to 80% mid LAD stenosis using Onyx  Frontier 3.0 x 22 mm drug-eluting stent (postdilated up to 4.1 mm proximally) with 0% residual stenosis and TIMI-3.  Small D2 branch was jailed by the stent with reduction in flow from TIMI-2 to TIMI-0 and collateralization via left-to-left collaterals.   Recommendations: Dual antiplatelet therapy with aspirin and prasugrel for at least 12 months.  Given potential interactions between a Atripla and clopidogrel as well as ticagrelor, Mr. Trow and I agreed that pressor grill would have the best risk-benefit profile. Aggressive secondary prevention including high intensity statin therapy. Medical management of residual LAD, LCx, and RCA disease which is noncritical or too small/distal for intervention. __________  2D echo 05/10/2021: 1. Left ventricular ejection fraction, by estimation, is 65 to 70%. The  left ventricle has normal function. The left ventricle has no regional  wall motion  abnormalities. There is mild left ventricular hypertrophy.  Left ventricular diastolic parameters  are consistent with Grade I diastolic dysfunction (impaired relaxation).   2. Right ventricular systolic function is normal. The right ventricular  size is normal. There is normal pulmonary artery systolic pressure.   3. The mitral valve is grossly normal. Trivial mitral valve  regurgitation. No evidence of mitral stenosis.   4. The aortic valve is tricuspid. Aortic valve regurgitation is not  visualized. No aortic stenosis is present.   5. The inferior vena cava is normal in size with greater than 50%  respiratory variability, suggesting right atrial pressure of 3 mmHg.   6. Faint Doppler signal noted near the interatrial septum; this is most  likely artifact but a tinly left-to-right shunt cannot be excluded. __________  2D echo 06/12/2017: - Left ventricle: The cavity size was normal. There was mild    concentric hypertrophy. Systolic function was normal. The    estimated ejection fraction was in the range  of 60% to 65%. Wall    motion was normal; there were no regional wall motion    abnormalities. Left ventricular diastolic function parameters    were normal.  - Mitral valve: There was mild regurgitation.  - Left atrium: The atrium was mildly dilated.  - Right ventricle: Systolic function was normal.  - Pulmonary arteries: Systolic pressure was within the normal    range.  __________   [facility] 06/13/2017: Coronary angiography:  Coronary dominance: Right  Left mainstem:   Large vessel that bifurcates into the LAD and left circumflex, no significant disease noted  Left anterior descending (LAD):   Large vessel that extends to the apical region, diagonal branch 2 of moderate size, 30-40% proximal mid and distal disease.   Left circumflex (LCx):  Large vessel with OM branch 2, occluded distal circumflex after OM 2.  No significant collaterals noted High OM or ramus noted.  Very small branch coming off the ramus is diffusely diseased  Right coronary artery (RCA):  Right dominant vessel with PL and PDA, mild 30-40% proximal and distal disease  Left ventriculography: Left ventricular systolic function is normal, LVEF is estimated at 55-65%, there is no significant mitral regurgitation , no significant aortic valve stenosis  Final Conclusions:   Culprit lesion likely distal circumflex which appears occluded There is also small diffusely diseased vessel coming off a ramus/high OM Otherwise nonobstructive disease noted in LAD and RCA Medical management recommended  Recommendations:  Aspirin and Plavix, High-dose statin Stay on lisinopril and metoprolol  follow-up in clinic    EKG:  EKG is ordered today.  The EKG ordered today demonstrates NSR, 83 bpm, no acute ST-T changes  Recent Labs: 12/24/2020: ALT 23 05/09/2021: Magnesium 1.9 05/12/2021: BUN 21; Creatinine, Ser 1.08; Hemoglobin 15.4; Platelets 391; Potassium 3.6; Sodium 135  Recent Lipid Panel    Component Value Date/Time   CHOL  123 05/10/2021 0634   TRIG 123 05/10/2021 0634   HDL 28 (L) 05/10/2021 0634   CHOLHDL 4.4 05/10/2021 0634   VLDL 25 05/10/2021 0634   LDLCALC 70 05/10/2021 0634   LDLCALC 118 (H) 09/10/2018 1225    PHYSICAL EXAM:    VS:  BP 130/90 (BP Location: Left Arm, Patient Position: Sitting, Cuff Size: Normal)   Pulse 83   Ht 5\' 10"  (1.778 m)   Wt 216 lb 3.2 oz (98.1 kg)   SpO2 98%   BMI 31.02 kg/m   BMI: Body mass index is 31.02 kg/m.  Physical Exam Vitals  reviewed.  Constitutional:      Appearance: He is well-developed.  HENT:     Head: Normocephalic and atraumatic.  Eyes:     General:        Right eye: No discharge.        Left eye: No discharge.     Conjunctiva/sclera:     Right eye: Hemorrhage present.     Comments: Chronic right scleral injection is again noted.  This was present precath in [facility] ED at [facility].  Neck:     Vascular: No JVD.  Cardiovascular:     Rate and Rhythm: Normal rate and regular rhythm.     Pulses:          Posterior tibial pulses are 2+ on the right side and 2+ on the left side.     Heart sounds: Normal heart sounds, S1 normal and S2 normal. Heart sounds not distant. No midsystolic click and no opening snap. No murmur heard.   No friction rub.     Comments: Right radial arteriotomy site is well-healing without active bleeding, bruising, swelling, warmth, erythema, or tenderness to palpation.  Radial pulse 2+ proximal and distal to arteriotomy site. Pulmonary:     Effort: Pulmonary effort is normal. No respiratory distress.     Breath sounds: Normal breath sounds. No decreased breath sounds, wheezing or rales.  Chest:     Chest wall: No tenderness.  Abdominal:     General: There is no distension.     Palpations: Abdomen is soft.     Tenderness: There is no abdominal tenderness.  Musculoskeletal:     Cervical back: Normal range of motion.     Right lower leg: No edema.     Left lower leg: No edema.  Skin:    General: Skin is warm and dry.     Nails:  There is no clubbing.  Neurological:     Mental Status: He is alert and oriented to person, place, and time.  Psychiatric:        Speech: Speech normal.        Behavior: Behavior normal.        Thought Content: Thought content normal.        Judgment: Judgment normal.    Wt Readings from Last 3 Encounters:  05/19/21 216 lb 3.2 oz (98.1 kg)  05/12/21 213 lb 10 oz (96.9 kg)  01/09/21 222 lb (100.7 kg)     ASSESSMENT & PLAN:   CAD involving the native coronary arteries with recent NSTEMI without angina: He is doing well without symptoms concerning for angina.  He has been taking prasugrel 10 mg a monotherapy since his hospital discharge.  He denies missing any doses of this.  Discussed the importance of dual antiplatelet therapy with aspirin and prasugrel.  He will start aspirin 81 mg daily along with continuation of Effient 10 mg daily without interruption for at least 12 months from date of PCI (05/11/2021).  Refills of Effient were supplied for the next calendar year.  Ongoing medical management for residual LAD, LCx, and RCA disease which was noncritical or too small/distal for intervention.  He will otherwise continue secondary prevention with atorvastatin, metoprolol, and as needed SL NTG.  Post-cath instructions.  He plans to participate in cardiac rehab.  No indication for further ischemic testing at this time.  HTN: Blood pressure is reasonably controlled in the office today.  He has been off all antihypertensives including amlodipine, lisinopril/HCTZ, and Lopressor.  Initiate Toprol-XL 25 mg  daily.  In follow-up, escalate blood pressure therapy as indicated  HLD: LDL 70.  Continue titrated dose of atorvastatin 80 mg daily.  Dizziness/gait unsteadiness: Longstanding issue though he does report this has been progressive since undergoing LHC.  Schedule head CT without contrast.  HIV: He remains on home meds.  Follow-up with PCP/ID as directed.  Disposition: F/u with Dr. Rockey Situ or an  APP in 1 month.   Medication Adjustments/Labs and Tests Ordered: Current medicines are reviewed at length with the patient today.  Concerns regarding medicines are outlined above. Medication changes, Labs and Tests ordered today are summarized above and listed in the Patient Instructions accessible in Encounters.   Signed, Christell Faith, PA-C 05/19/2021 10:39 AM     [facility] [address] [place], [place] [address] [phone]

## 2021-05-19 ENCOUNTER — Other Ambulatory Visit: Payer: Self-pay

## 2021-05-19 ENCOUNTER — Telehealth: Payer: Self-pay

## 2021-05-19 ENCOUNTER — Encounter: Payer: Self-pay | Admitting: Physician Assistant

## 2021-05-19 ENCOUNTER — Ambulatory Visit (INDEPENDENT_AMBULATORY_CARE_PROVIDER_SITE_OTHER): Payer: BC Managed Care – PPO | Admitting: Physician Assistant

## 2021-05-19 VITALS — BP 130/90 | HR 83 | Ht 70.0 in | Wt 216.2 lb

## 2021-05-19 DIAGNOSIS — I10 Essential (primary) hypertension: Secondary | ICD-10-CM

## 2021-05-19 DIAGNOSIS — I2510 Atherosclerotic heart disease of native coronary artery without angina pectoris: Secondary | ICD-10-CM

## 2021-05-19 DIAGNOSIS — R42 Dizziness and giddiness: Secondary | ICD-10-CM

## 2021-05-19 DIAGNOSIS — E785 Hyperlipidemia, unspecified: Secondary | ICD-10-CM | POA: Diagnosis not present

## 2021-05-19 DIAGNOSIS — B20 Human immunodeficiency virus [HIV] disease: Secondary | ICD-10-CM

## 2021-05-19 DIAGNOSIS — I214 Non-ST elevation (NSTEMI) myocardial infarction: Secondary | ICD-10-CM | POA: Diagnosis not present

## 2021-05-19 DIAGNOSIS — R2681 Unsteadiness on feet: Secondary | ICD-10-CM

## 2021-05-19 MED ORDER — METOPROLOL SUCCINATE ER 25 MG PO TB24: 25.0000 mg | ORAL_TABLET | Freq: Every day | ORAL | 3 refills | Status: DC

## 2021-05-19 MED ORDER — PRASUGREL HCL 10 MG PO TABS: 10.0000 mg | ORAL_TABLET | Freq: Every day | ORAL | 11 refills | Status: AC

## 2021-05-19 NOTE — Patient Instructions (Signed)
Medication Instructions:   Please continue your medications on your new updated medication list  *If you need a refill on your cardiac medications before your next appointment, please call your pharmacy*   Lab Work: None  Testing/Procedures: Head CT  Please call [phone] to schedule at your earliest convince   This is done at our [facility] in [facility] [place], [place] [address]  Follow-Up: At [facility], you and your health needs are our priority.  As part of our continuing mission to provide you with exceptional heart care, we have created designated Provider Care Teams.  These Care Teams include your primary Cardiologist (physician) and Advanced Practice Providers (APPs -  Physician Assistants and Nurse Practitioners) who all work together to provide you with the care you need, when you need it.   Your next appointment:   1 month(s)  The format for your next appointment:   In Person  Provider:   Kathlyn Sacramento, MD or Christell Faith, PA-C

## 2021-05-19 NOTE — Telephone Encounter (Signed)
Pt drop off form to be complete by provider, pt seen by Christell Faith PA-C in office today  Called pt to advised his [place] [employer] Coverage with [employer] Claim Form has been completed by provider  Form will be upfront with office staff for him to pick up at his convince.

## 2021-05-23 DIAGNOSIS — I10 Essential (primary) hypertension: Secondary | ICD-10-CM | POA: Diagnosis not present

## 2021-05-23 DIAGNOSIS — Z0000 Encounter for general adult medical examination without abnormal findings: Secondary | ICD-10-CM | POA: Diagnosis not present

## 2021-05-23 DIAGNOSIS — Z125 Encounter for screening for malignant neoplasm of prostate: Secondary | ICD-10-CM | POA: Diagnosis not present

## 2021-05-23 DIAGNOSIS — E782 Mixed hyperlipidemia: Secondary | ICD-10-CM | POA: Diagnosis not present

## 2021-05-23 DIAGNOSIS — D432 Neoplasm of uncertain behavior of brain, unspecified: Secondary | ICD-10-CM | POA: Diagnosis not present

## 2021-05-23 DIAGNOSIS — B20 Human immunodeficiency virus [HIV] disease: Secondary | ICD-10-CM | POA: Diagnosis not present

## 2021-05-23 DIAGNOSIS — I2510 Atherosclerotic heart disease of native coronary artery without angina pectoris: Secondary | ICD-10-CM | POA: Diagnosis not present

## 2021-05-26 DIAGNOSIS — G609 Hereditary and idiopathic neuropathy, unspecified: Secondary | ICD-10-CM | POA: Diagnosis not present

## 2021-05-30 ENCOUNTER — Ambulatory Visit: Payer: BC Managed Care – PPO | Attending: Physician Assistant

## 2021-06-01 ENCOUNTER — Telehealth: Payer: Self-pay

## 2021-06-01 NOTE — Telephone Encounter (Signed)
Lmov to remind patient to reschedule missed appt for CT

## 2021-06-16 DIAGNOSIS — Z961 Presence of intraocular lens: Secondary | ICD-10-CM | POA: Diagnosis not present

## 2021-06-16 DIAGNOSIS — H26493 Other secondary cataract, bilateral: Secondary | ICD-10-CM | POA: Diagnosis not present

## 2021-06-16 DIAGNOSIS — H35373 Puckering of macula, bilateral: Secondary | ICD-10-CM | POA: Diagnosis not present

## 2021-06-16 DIAGNOSIS — H26492 Other secondary cataract, left eye: Secondary | ICD-10-CM | POA: Diagnosis not present

## 2021-06-16 DIAGNOSIS — H18413 Arcus senilis, bilateral: Secondary | ICD-10-CM | POA: Diagnosis not present

## 2021-06-16 NOTE — Discharge Summary (Signed)
Physician Discharge Summary  Christopher Hart. SWN:[id number] DOB: April 22, 1961 DOA: 05/09/2021  PCP: Gladstone Lighter, MD  Admit date: 05/09/2021 Discharge date: 05/12/2021  Recommendations for Outpatient Follow-up:  Discharge to home.  Follow up with PCP in 7-10 days.  Have chemistry checked on that visit and reported to PCP. Follow up with cardiology as directed.  Discharge Diagnoses: Principal diagnosis is #1 NSTEMI CAD with unstable angina Hypertension HIV Anxiety   Discharge Condition: Fair  Disposition: Home  Diet recommendation: Heart healthy  Filed Weights   05/10/21 1557 05/11/21 0449 05/12/21 0500  Weight: 96.2 kg 97.9 kg 96.9 kg    History of present illness:   Christopher Devol. is a 60 y.o. male with medical history significant for coronary artery disease (NSTEMI and cardiac cath 06/13/17 but no stents placed and is not followed by a cardiologist), HIV, hypertension, hyperlipidemia, Bell's palsy with residual facial droop, GERD, chronic difficulty walking, who presents to the emergency department on 05/09/2021 with chest pain.  Patient reports left shoulder/elbow pain with exertion intermittently over the 3-4 weeks prior to admission but then developed overwhelming chest pain on the day of admission when he was walking his dog. Normally the pain crescendos then "pops" and stops. On the day of admission pain was in the left chest and substernal area, radiated to left shoulder, was up to 10/10 at times and was characterized as heaviness and pressure "like there were cinder blocks on it." He said, "It felt like there were 2 hands pulling on my heart in opposite directions." Symptoms are alleviated by nothing and exacerbated by walking/exertion. Associated symptoms: Profuse diaphoresis with chest pain. Shortness of breath. Nausea, drooling (happens before he vomits), but did not actually vomit. Subjective fever, chills, cough, and wheezing x several days. Had periumbilical  abdominal pain, intermittent x 2 days, that was characterized as burning/indigestion. No other abdominal symptoms. Had episode of the periumbical abdominal burning with associated left arm numbness feeling. Generalized weakness/ fatigue. Uses a cane at baseline.  Patient's chest pain was severe so EMS was called.  Pain had decreased upon arrival in the emergency department; he had 2 slight episodes of chest pain in the waiting room but none since then.    ED Course: SARS-CoV-2 PCR and influenza testing were all negative.  Initial troponin 80 with second troponin 417.  Chest x-ray showed no acute process.    Hospital Course: 60 y.o. male with medical history significant for coronary artery disease (NSTEMI and cardiac cath 06/13/17 but no stents placed and is not followed by a cardiologist), HIV, hypertension, hyperlipidemia, Bell's palsy with residual facial droop, GERD, chronic difficulty walking admitted for chest pain and elevated troponins.  Patient is demanding food or wanting to leave.   Pt underwent LHC today. It demonstrated Significant three-vessel coronary artery disease including sequential 50% proximal, 80% mid, 40% mid, and 70% distal LAD stenoses, diffusely diseased small distal LCx and OM2 with up to 90% stenosis, and sequential 50% ostial/proximal RCA, 40-50% distal RCA, and 90% distal RPAV lesions. Normal left ventricular filling pressure (LVEDP 10-15 mmHg). Successful iFR and IVUS guided PCI to 80% mid LAD stenosis using Onyx Frontier 3.0 x 22 mm drug-eluting stent (postdilated up to 4.1 mm proximally) with 0% residual stenosis and TIMI-3.  Small D2 branch was jailed by the stent with reduction in flow from TIMI-2 to TIMI-0 and collateralization via left-to-left collaterals.   Recommendations from cardiology include DAPT with ASA and prasugrel for 12 months, high intensity statin  therapy, medical mgmt of LAD, Lt Cx, and RCA which was noncritical or too small/distal for intervention.  The  patient will be discharged to home today in fair condition   Today's assessment: S: The patient is resting comfortably. No new complaints. O: Vitals:  Vitals:   05/12/21 0806 05/12/21 1141  BP: 137/81 127/82  Pulse: 75 76  Resp: 20 19  Temp: 98 F (36.7 C) 98.2 F (36.8 C)  SpO2: 99% 100%    Constitutional:  Somnolent. No acute distress. Respiratory:  CTA bilaterally, no w/r/r.  Respiratory effort normal. No retractions or accessory muscle use Cardiovascular:  RRR, no m/r/g No LE extremity edema   Normal pedal pulses Abdomen:  Abdomen appears normal; no tenderness or masses No hernias No HSM Musculoskeletal:  Digits/nails BUE: no clubbing, cyanosis, petechiae, infection exam of joints, bones, muscles of at least one of following: head/neck, RUE, LUE, RLE, LLE   Skin:  No rashes, lesions, ulcers palpation of skin: no induration or nodules  Discharge Instructions  Discharge Instructions     AMB Referral to Cardiac Rehabilitation - Phase II   Complete by: As directed    Diagnosis: Coronary Stents   After initial evaluation and assessments completed: Virtual Based Care may be provided alone or in conjunction with Phase 2 Cardiac Rehab based on patient barriers.: Yes   Activity as tolerated - No restrictions   Complete by: As directed    Call MD for:  difficulty breathing, headache or visual disturbances   Complete by: As directed    Call MD for:  extreme fatigue   Complete by: As directed    Call MD for:  persistant dizziness or light-headedness   Complete by: As directed    Call MD for:  severe uncontrolled pain   Complete by: As directed    Diet - low sodium heart healthy   Complete by: As directed    Discharge instructions   Complete by: As directed    Discharge to home.  Follow up with PCP in 7-10 days.  Have chemistry checked on that visit and reported to PCP. Follow up with cardiology as directed.   Increase activity slowly   Complete by: As  directed       Allergies as of 05/12/2021       Reactions   Contrast Media [iodinated Diagnostic Agents]    Penicillins    Whelps        Medication List     STOP taking these medications    ALPRAZolam 1 MG tablet Commonly known as: XANAX   ezetimibe 10 MG tablet Commonly known as: ZETIA   predniSONE 20 MG tablet Commonly known as: DELTASONE       TAKE these medications    aspirin 81 MG chewable tablet Chew 1 tablet (81 mg total) daily by mouth.   atorvastatin 80 MG tablet Commonly known as: LIPITOR Take 1 tablet (80 mg total) by mouth daily. What changed:  medication strength how much to take   efavirenz-emtricitabine-tenofovir 600-200-300 MG tablet Commonly known as: ATRIPLA Take 1 tablet by mouth at bedtime.   LORazepam 1 MG tablet Commonly known as: ATIVAN Take 1 tablet by mouth every 8 (eight) hours as needed.   meclizine 25 MG tablet Commonly known as: ANTIVERT Take 1 tablet (25 mg total) by mouth 3 (three) times daily as needed for dizziness or nausea.   nitroGLYCERIN 0.4 MG SL tablet Commonly known as: NITROSTAT Place 1 tablet (0.4 mg total) under the tongue every  5 (five) minutes as needed for chest pain.       Allergies  Allergen Reactions   Contrast Media [Iodinated Diagnostic Agents]    Penicillins     Whelps    The results of significant diagnostics from this hospitalization (including imaging, microbiology, ancillary and laboratory) are listed below for reference.    Significant Diagnostic Studies: No results found.  Microbiology: No results found for this or any previous visit (from the past 240 hour(s)).   Labs: Basic Metabolic Panel: No results for input(s): NA, K, CL, CO2, GLUCOSE, BUN, CREATININE, CALCIUM, MG, PHOS in the last 168 hours. Liver Function Tests: No results for input(s): AST, ALT, ALKPHOS, BILITOT, PROT, ALBUMIN in the last 168 hours. No results for input(s): LIPASE, AMYLASE in the last 168 hours. No  results for input(s): AMMONIA in the last 168 hours. CBC: No results for input(s): WBC, NEUTROABS, HGB, HCT, MCV, PLT in the last 168 hours. Cardiac Enzymes: No results for input(s): CKTOTAL, CKMB, CKMBINDEX, TROPONINI in the last 168 hours. BNP: BNP (last 3 results) No results for input(s): BNP in the last 8760 hours.  ProBNP (last 3 results) No results for input(s): PROBNP in the last 8760 hours.  CBG: No results for input(s): GLUCAP in the last 168 hours.  Principal Problem:   NSTEMI (non-ST elevated myocardial infarction) ([place]) Active Problems:   Primary hypertension   HIV disease ([place])   Mixed hyperlipidemia   GERD without esophagitis   Coronary artery disease involving native coronary artery of native heart with unstable angina pectoris ([place])   Anxiety   ACS (acute coronary syndrome) ([place])   Time coordinating discharge: 38 minutes  Signed:        Wretha Laris, DO [facility]  06/16/2021, 6:08 AM

## 2021-06-17 DIAGNOSIS — R4781 Slurred speech: Secondary | ICD-10-CM | POA: Diagnosis not present

## 2021-06-17 DIAGNOSIS — R531 Weakness: Secondary | ICD-10-CM | POA: Diagnosis not present

## 2021-06-17 DIAGNOSIS — I129 Hypertensive chronic kidney disease with stage 1 through stage 4 chronic kidney disease, or unspecified chronic kidney disease: Secondary | ICD-10-CM | POA: Diagnosis not present

## 2021-06-17 DIAGNOSIS — Z79899 Other long term (current) drug therapy: Secondary | ICD-10-CM | POA: Diagnosis not present

## 2021-06-17 DIAGNOSIS — Z21 Asymptomatic human immunodeficiency virus [HIV] infection status: Secondary | ICD-10-CM | POA: Diagnosis not present

## 2021-06-17 DIAGNOSIS — A523 Neurosyphilis, unspecified: Secondary | ICD-10-CM | POA: Diagnosis not present

## 2021-06-17 DIAGNOSIS — N189 Chronic kidney disease, unspecified: Secondary | ICD-10-CM | POA: Diagnosis not present

## 2021-06-17 DIAGNOSIS — Z23 Encounter for immunization: Secondary | ICD-10-CM | POA: Diagnosis not present

## 2021-06-17 DIAGNOSIS — R2689 Other abnormalities of gait and mobility: Secondary | ICD-10-CM | POA: Diagnosis not present

## 2021-06-18 DIAGNOSIS — A523 Neurosyphilis, unspecified: Secondary | ICD-10-CM | POA: Diagnosis not present

## 2021-06-19 DIAGNOSIS — A523 Neurosyphilis, unspecified: Secondary | ICD-10-CM | POA: Diagnosis not present

## 2021-06-20 ENCOUNTER — Encounter (INDEPENDENT_AMBULATORY_CARE_PROVIDER_SITE_OTHER): Payer: BC Managed Care – PPO | Admitting: Ophthalmology

## 2021-06-20 ENCOUNTER — Encounter (INDEPENDENT_AMBULATORY_CARE_PROVIDER_SITE_OTHER): Payer: Self-pay | Admitting: Ophthalmology

## 2021-06-20 ENCOUNTER — Other Ambulatory Visit: Payer: Self-pay

## 2021-06-20 ENCOUNTER — Ambulatory Visit (INDEPENDENT_AMBULATORY_CARE_PROVIDER_SITE_OTHER): Payer: BC Managed Care – PPO | Admitting: Ophthalmology

## 2021-06-20 DIAGNOSIS — H35371 Puckering of macula, right eye: Secondary | ICD-10-CM

## 2021-06-20 DIAGNOSIS — H35372 Puckering of macula, left eye: Secondary | ICD-10-CM

## 2021-06-20 DIAGNOSIS — H35351 Cystoid macular degeneration, right eye: Secondary | ICD-10-CM | POA: Diagnosis not present

## 2021-06-20 DIAGNOSIS — Z8669 Personal history of other diseases of the nervous system and sense organs: Secondary | ICD-10-CM | POA: Diagnosis not present

## 2021-06-20 DIAGNOSIS — H35353 Cystoid macular degeneration, bilateral: Secondary | ICD-10-CM

## 2021-06-20 DIAGNOSIS — H26491 Other secondary cataract, right eye: Secondary | ICD-10-CM

## 2021-06-20 DIAGNOSIS — H35352 Cystoid macular degeneration, left eye: Secondary | ICD-10-CM

## 2021-06-20 DIAGNOSIS — H35373 Puckering of macula, bilateral: Secondary | ICD-10-CM | POA: Diagnosis not present

## 2021-06-20 DIAGNOSIS — R262 Difficulty in walking, not elsewhere classified: Secondary | ICD-10-CM | POA: Diagnosis not present

## 2021-06-20 DIAGNOSIS — A523 Neurosyphilis, unspecified: Secondary | ICD-10-CM | POA: Diagnosis not present

## 2021-06-20 DIAGNOSIS — R42 Dizziness and giddiness: Secondary | ICD-10-CM | POA: Diagnosis not present

## 2021-06-20 MED ORDER — KETOROLAC TROMETHAMINE 0.5 % OP SOLN: 1.0000 [drp] | Freq: Four times a day (QID) | OPHTHALMIC | 5 refills | Status: AC

## 2021-06-20 MED ORDER — FLUORESCEIN SODIUM 10 % IV SOLN
500.0000 mg | INTRAVENOUS | Status: AC | PRN
  Administered 2021-06-20: 500 mg via INTRAVENOUS

## 2021-06-20 NOTE — Progress Notes (Signed)
Cardiology Office Note    Date:  06/21/2021   ID:  [name] DOB [birth year], MRN [id number]  PCP:  Gladstone Lighter, MD  Cardiologist:  Ida Rogue, MD  Electrophysiologist:  None   Chief Complaint: Follow up  History of Present Illness:   Christopher Hart. is a 60 y.o. male with history of CAD with NSTEMI in 06/2017 with cath recommending medical management as outlined below with recurrent NSTEMI in 05/2021 status post PCI to the LAD, HIV, neurosyphilis, HTN, HLD, Bell's palsy in 12/2020, vertigo, subpendymoma mass stable since 2018, peripheral neuropathy, depression, anxiety, and obesity who presents for follow up of CAD.   [name] was admitted to the hospital in 06/2017 with an NSTEMI.  Troponin peaked at 3.5.  Echo showed an EF of 60 to 65%, no regional wall motion abnormalities, normal LV diastolic function parameters, mild mitral regurgitation, mildly dilated left atrium, normal RV systolic function and PASP.  He did mention having a contrast allergy, and was premedicated.  Subsequent LHC showed a large LAD with 30 to 40% proximal/mid/distal disease, occluded mid/distal LCx, 30% proximal RCA stenosis, and 40% distal RCA stenosis.  Medical management was recommended.  Following discharge, he was lost to follow-up.   He was admitted to the hospital in 12/2020 with Bell's palsy and found to have a subpendymoma mass that was stable since 2018, which is followed by neurology.  MRI was negative for acute CVA.   He was admitted to the hospital from 10/3 through 10/6 with an NSTEMI.  High-sensitivity troponin peaked at 417.  Echo showed an EF of 65 to 70%, no regional wall motion abnormalities, mild LVH, grade 1 diastolic dysfunction, normal RV systolic function and ventricular cavity size, normal PASP, trivial mitral regurgitation, estimated right atrial pressure 3 mmHg.  After premedication for contrast allergy, he underwent LHC on 05/11/2021 which demonstrated significant  three-vessel CAD including sequential 50% proximal, 80% mid, 40% mid, and 70% distal LAD stenoses, diffusely diseased small distal LCx and OM2 with up to 90% stenosis, and sequential 50% ostial/proximal RCA, 40 to 50% distal RCA, and 90% distal RPA V lesions.  Normal LV filling pressure.  He underwent successful IFR and IVUS guided PCI to the mid LAD.  A small D2 branch was jailed by the stent with with reduction in flow from TIMI-2 to TIMI-0 and collateralization via left to left collaterals.  Given potential interactions with a Atripla and clopidogrel as well as ticagrelor, prasugrel was felt to be the best option from a risk/benefit scenario.  Medical management of residual LAD, LCx, and RCA disease, which was noncritical or too small/distal for intervention, was recommended.    He was seen in hospital follow-up on 05/19/2021 and was doing well from a cardiac perspective.  He did note chronic dizziness and gait unsteadiness which he felt like were progressive following his hospitalization.  He reported he only had 3 prescriptions available for pickup at the pharmacy following discharge.  These included Effient, atorvastatin, and SL NTG.  The discharge summary was not available for review at the time of his hospital follow-up.  He had been taking Effient without aspirin.  He was advised to take aspirin with Effient.  He was started on Toprol-XL 25 mg.  With regards to his dizziness, head CT was ordered, though remains pending at this time.  Since he was last seen, he has been diagnosed with neurosyphilis and has established with ID.  He comes in doing well from  a cardiac perspective.  No angina or dyspnea.  He is tolerating cardiac medications without issues.  He does continue to note some generalized weakness involving the right upper extremity.  No falls, hematochezia, or melena.  He is under increased stress at home dealing with automobile insurance, and with maintaining his recently placed PICC line.  He  feels these are contributing to his elevated BP.  His weight was noted to be elevated in the office today, though this is likely in the setting of a large, thick coat, and in the context with his personal belongings.  He does not have any  cardiac concerns at this time.    Labs independently reviewed: 05/2021 - Hgb 15.4, PLT 391, potassium 3.6, BUN 21, serum creatinine 1.08, TC 123, TG 123, HDL 28, LDL 70, magnesium 1.9 01/2021 - TSH normal 12/2020 - albumin 3.7, AST/ALT normal    Past Medical History:  Diagnosis Date   Allergic rhinitis    Anxiety    BCE (basal cell epithelioma)    Has had 23 basal cell skin cancer lesions   CAD (coronary artery disease)    a. Cath 06/13/17: Mid Cx 100% stenosed-->Med Rx. 40% stenosis of dist RCA, distal LAD. 30% stenosis prox RCA, prox LAD; b. 05/2021 NSTEMI/PCI: LM nl, LAD 50p, 80p/m (iFR 0.83-->3.0x22 Onyx Frontier DES), 24m, 70d, D1 25, LCX 90d, OM2 70, RCA 50ost/p, 45d, RPAV 90.   Congenital cataract of both eyes    Initially legally blind, but sight improved with cataract removal   Diastolic dysfunction    a. 05/2021 Echo: EF 65-70%, no rwma, GRI DD, nl RV size/fxn, nl RVSP. Triv MR.   Difficulty walking    Idiopathic peripheral neuropathy.  Followed by neurologist.  EMG 01/20/2021: Abnormal.  Sensorimotor peripheral neuropathy with axonal features.  No conduction blocks.  No evidence of myopathy.   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   GERD (gastroesophageal reflux disease)    HIV infection (HCC)    Low viral load. Takes medication. Managed by primary care.   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 Sees neurologist, Dr. Melrose Nakayama.   Vertigo     Past Surgical History:  Procedure Laterality  Date   Cataract surgery Bilateral    CORONARY STENT INTERVENTION N/A 05/11/2021   Procedure: CORONARY STENT INTERVENTION;  Surgeon: Nelva Bush, MD;  Location: [facility];  Service: Cardiovascular;  Laterality: N/A;   INTRAVASCULAR PRESSURE WIRE/FFR STUDY N/A 05/11/2021   Procedure: INTRAVASCULAR PRESSURE WIRE/FFR STUDY;  Surgeon: Nelva Bush, MD;  Location: [facility];  Service: Cardiovascular;  Laterality: N/A;   INTRAVASCULAR ULTRASOUND/IVUS N/A 05/11/2021   Procedure: Intravascular Ultrasound/IVUS;  Surgeon: Nelva Bush, MD;  Location: [facility];  Service: Cardiovascular;  Laterality: N/A;   LEFT HEART CATH AND CORONARY ANGIOGRAPHY N/A 06/13/2017   Procedure: LEFT HEART CATH AND CORONARY ANGIOGRAPHY;  Surgeon: Minna Merritts, MD;  Location: [facility];  Service: Cardiovascular;  Laterality: N/A;   LEFT HEART CATH AND CORONARY ANGIOGRAPHY N/A 05/11/2021   Procedure: LEFT HEART CATH AND CORONARY ANGIOGRAPHY;  Surgeon: Nelva Bush, MD;  Location: [facility];  Service: Cardiovascular;  Laterality: N/A;    Current Medications: Current Meds  Medication Sig   aspirin 81 MG chewable tablet Chew 1 tablet (81 mg total) daily by mouth.   atorvastatin (LIPITOR) 80 MG tablet Take 1 tablet (80 mg total) by mouth daily.   efavirenz-emtricitabine-tenofovir (ATRIPLA) 600-200-300 MG tablet Take 1 tablet by mouth at bedtime.   ketorolac (ACULAR) 0.5 % ophthalmic solution Place 1 drop into both eyes 4 (four) times daily.   LORazepam (ATIVAN) 1 MG tablet Take 1 tablet by mouth every 8 (eight) hours as needed.   meclizine (ANTIVERT) 25 MG tablet Take 1 tablet (25 mg total) by mouth 3 (three) times daily as needed for dizziness or nausea.   metoprolol succinate (TOPROL XL) 25 MG 24 hr tablet Take 1 tablet (25 mg total) by mouth daily.   nitroGLYCERIN (NITROSTAT) 0.4 MG SL tablet Place 1 tablet (0.4 mg total) under the tongue every 5 (five)  minutes as needed for chest pain.   ondansetron (ZOFRAN-ODT) 4 MG disintegrating tablet Take 4 mg by mouth every 8 (eight) hours as needed for nausea or vomiting.   prasugrel (EFFIENT) 10 MG TABS tablet Take 1 tablet (10 mg total) by mouth daily.    Allergies:   Contrast media [iodinated diagnostic agents] and Penicillins   Social History   Socioeconomic History   Marital status: Single    Spouse name: Not on file   Number of children: Not on file   Years of education: Not on file   Highest education level: Not on file  Occupational History   Occupation: Unemployed   Tobacco Use   Smoking status: Never   Smokeless tobacco: Never  Vaping Use   Vaping Use: Never used  Substance and Sexual Activity   Alcohol use: Yes    Comment: rare   Drug use: No   Sexual activity: Not Currently  Other Topics Concern   Not on file  Social History Narrative   Not on file   Social Determinants of Health   Financial Resource Strain: Not on file  Food Insecurity: Not on file  Transportation Needs: Not on file  Physical Activity: Not on file  Stress: Not on file  Social Connections: Not on file     Family History:  The patient's family history includes CAD in his maternal grandmother, mother, and paternal grandmother.  ROS:   Review of Systems  Constitutional:  Positive for malaise/fatigue. Negative for chills, diaphoresis, fever and weight loss.  HENT:  Negative for congestion.   Eyes:  Negative for discharge and redness.  Respiratory:  Negative for cough, sputum production, shortness of breath and wheezing.   Cardiovascular:  Negative for chest pain, palpitations, orthopnea, claudication, leg swelling and PND.  Gastrointestinal:  Negative for abdominal pain, heartburn, melena, nausea and vomiting.  Musculoskeletal:  Negative for falls and myalgias.  Skin:  Negative for rash.  Neurological:  Positive for weakness. Negative for dizziness, tingling, tremors, sensory change, speech  change, focal weakness and loss of consciousness.  Endo/Heme/Allergies:  Does not bruise/bleed easily.  Psychiatric/Behavioral:  Negative for substance abuse. The patient is not nervous/anxious.   All other systems reviewed and are negative.   EKGs/Labs/Other Studies Reviewed:    Studies reviewed were summarized above. The additional studies were reviewed today:  LHC 05/11/2021: Conclusions: Significant three-vessel coronary artery disease including sequential 50% proximal, 80% mid, 40% mid, and 70% distal LAD stenoses, diffusely diseased small distal LCx and OM2 with up to 90% stenosis, and sequential 50% ostial/proximal RCA, 40-50% distal RCA, and 90% distal RPAV lesions. Normal left  ventricular filling pressure (LVEDP 10-15 mmHg). Successful iFR and IVUS guided PCI to 80% mid LAD stenosis using Onyx Frontier 3.0 x 22 mm drug-eluting stent (postdilated up to 4.1 mm proximally) with 0% residual stenosis and TIMI-3.  Small D2 branch was jailed by the stent with reduction in flow from TIMI-2 to TIMI-0 and collateralization via left-to-left collaterals.   Recommendations: Dual antiplatelet therapy with aspirin and prasugrel for at least 12 months.  Given potential interactions between a Atripla and clopidogrel as well as ticagrelor, Mr. Vallecillo and I agreed that pressor grill would have the best risk-benefit profile. Aggressive secondary prevention including high intensity statin therapy. Medical management of residual LAD, LCx, and RCA disease which is noncritical or too small/distal for intervention. __________   2D echo 05/10/2021: 1. Left ventricular ejection fraction, by estimation, is 65 to 70%. The  left ventricle has normal function. The left ventricle has no regional  wall motion abnormalities. There is mild left ventricular hypertrophy.  Left ventricular diastolic parameters  are consistent with Grade I diastolic dysfunction (impaired relaxation).   2. Right ventricular systolic  function is normal. The right ventricular  size is normal. There is normal pulmonary artery systolic pressure.   3. The mitral valve is grossly normal. Trivial mitral valve  regurgitation. No evidence of mitral stenosis.   4. The aortic valve is tricuspid. Aortic valve regurgitation is not  visualized. No aortic stenosis is present.   5. The inferior vena cava is normal in size with greater than 50%  respiratory variability, suggesting right atrial pressure of 3 mmHg.   6. Faint Doppler signal noted near the interatrial septum; this is most  likely artifact but a tinly left-to-right shunt cannot be excluded. __________   2D echo 06/12/2017: - Left ventricle: The cavity size was normal. There was mild    concentric hypertrophy. Systolic function was normal. The    estimated ejection fraction was in the range of 60% to 65%. Wall    motion was normal; there were no regional wall motion    abnormalities. Left ventricular diastolic function parameters    were normal.  - Mitral valve: There was mild regurgitation.  - Left atrium: The atrium was mildly dilated.  - Right ventricle: Systolic function was normal.  - Pulmonary arteries: Systolic pressure was within the normal    range.  __________   [facility] 06/13/2017: Coronary angiography:  Coronary dominance: Right  Left mainstem:   Large vessel that bifurcates into the LAD and left circumflex, no significant disease noted  Left anterior descending (LAD):   Large vessel that extends to the apical region, diagonal branch 2 of moderate size, 30-40% proximal mid and distal disease.   Left circumflex (LCx):  Large vessel with OM branch 2, occluded distal circumflex after OM 2.  No significant collaterals noted High OM or ramus noted.  Very small branch coming off the ramus is diffusely diseased  Right coronary artery (RCA):  Right dominant vessel with PL and PDA, mild 30-40% proximal and distal disease  Left ventriculography: Left ventricular  systolic function is normal, LVEF is estimated at 55-65%, there is no significant mitral regurgitation , no significant aortic valve stenosis  Final Conclusions:   Culprit lesion likely distal circumflex which appears occluded There is also small diffusely diseased vessel coming off a ramus/high OM Otherwise nonobstructive disease noted in LAD and RCA Medical management recommended  Recommendations:  Aspirin and Plavix, High-dose statin Stay on lisinopril and metoprolol  follow-up in clinic  EKG:  EKG is ordered today.  The EKG ordered today demonstrates NSR, 94 bpm, no acute ST-T changes  Recent Labs: 12/24/2020: ALT 23 05/09/2021: Magnesium 1.9 05/12/2021: BUN 21; Creatinine, Ser 1.08; Hemoglobin 15.4; Platelets 391; Potassium 3.6; Sodium 135  Recent Lipid Panel    Component Value Date/Time   CHOL 123 05/10/2021 0634   TRIG 123 05/10/2021 0634   HDL 28 (L) 05/10/2021 0634   CHOLHDL 4.4 05/10/2021 0634   VLDL 25 05/10/2021 0634   LDLCALC 70 05/10/2021 0634   LDLCALC 118 (H) 09/10/2018 1225    PHYSICAL EXAM:    VS:  BP (!) 152/92   Pulse 96   Ht 5\' 10"  (1.778 m)   Wt 228 lb (103.4 kg)   SpO2 98%   BMI 32.71 kg/m   BMI: Body mass index is 32.71 kg/m.  Physical Exam Vitals reviewed.  Constitutional:      Appearance: He is well-developed.  HENT:     Head: Normocephalic and atraumatic.  Eyes:     General:        Right eye: No discharge.        Left eye: No discharge.  Neck:     Vascular: No JVD.  Cardiovascular:     Rate and Rhythm: Normal rate and regular rhythm.     Pulses:          Radial pulses are 2+ on the right side.       Posterior tibial pulses are 2+ on the right side and 2+ on the left side.     Heart sounds: Normal heart sounds, S1 normal and S2 normal. Heart sounds not distant. No midsystolic click and no opening snap. No murmur heard.   No friction rub.  Pulmonary:     Effort: Pulmonary effort is normal. No respiratory distress.     Breath  sounds: Normal breath sounds. No decreased breath sounds, wheezing or rales.  Chest:     Chest wall: No tenderness.  Abdominal:     General: There is no distension.     Palpations: Abdomen is soft.     Tenderness: There is no abdominal tenderness.  Musculoskeletal:     Cervical back: Normal range of motion.     Right lower leg: No edema.     Left lower leg: No edema.     Comments: 5/5 upper extremity strength bilaterally.   Skin:    General: Skin is warm and dry.     Nails: There is no clubbing.  Neurological:     Mental Status: He is alert and oriented to person, place, and time.  Psychiatric:        Speech: Speech normal.        Behavior: Behavior normal.        Thought Content: Thought content normal.        Judgment: Judgment normal.    Wt Readings from Last 3 Encounters:  06/21/21 228 lb (103.4 kg)  05/19/21 216 lb 3.2 oz (98.1 kg)  05/12/21 213 lb 10 oz (96.9 kg)     ASSESSMENT & PLAN:   CAD involving the native coronary arteries with recent NSTEMI without angina: He is doing well without symptoms concerning for angina.  He remains on DAPT with ASA and Effient without missing any doses.  No falls, hematochezia, or melena.  DAPT will be continued for a minimum duration of 12 months without interruption dating back to date of PCI which was 05/11/2021.  Ongoing medical management for residual  LAD, LCx, and RCA disease which was noncritical or too small/distal for intervention.  He will otherwise continue secondary prevention with atorvastatin, metoprolol, and as needed SL NTG.  Normal strength along the bilateral upper extremities.  No indication for further ischemic testing at this time.  HTN: Blood pressure is mildly elevated in the office today, though this is attributed to significant amount of increased stress lately.  Given this, medication changes were deferred at this time.  Should his BP remain elevated in follow-up titration of antihypertensive therapy will need to be  considered.  He would also benefit from a sleep study as outlined below.  HLD: LDL 70.  He remains on atorvastatin.  HIV/neurosyphilis: PICC in place.  Follow-up with PCP and ID as directed.  Sleep disordered breathing: He agrees sleep study would be indicated, though would like to defer this at this time given numerous medical appointments and in the context of increased stress.  This will be revisited in early 2023.   Disposition: F/u with Dr. Rockey Situ or an APP in 3 to 4 months.   Medication Adjustments/Labs and Tests Ordered: Current medicines are reviewed at length with the patient today.  Concerns regarding medicines are outlined above. Medication changes, Labs and Tests ordered today are summarized above and listed in the Patient Instructions accessible in Encounters.   Signed, Christell Faith, PA-C 06/21/2021 12:30 PM     [facility] [address] [place], [place] [address] [phone]

## 2021-06-20 NOTE — Assessment & Plan Note (Addendum)
Mild to moderate posterior capsule opacification not likely the cause of blurred vision in the right eye in the presence of epiretinal membrane and fairly severe CME OD   Okay to proceed with YAG capsulotomy right eye at any time as the patient will be taking topical NSAID as well as apparently Pred forte has been prescribed for him as well, by Dr. Talbert Forest

## 2021-06-20 NOTE — Assessment & Plan Note (Addendum)
Epiretinal membrane OD, with secondary CME or possibly secondary to the CME.  Will need fluorescein angiography to look for signs of MAC-TEL  OD, will treat empirically CME to see if CME causative of the ERM.  If not improved may need to consider vitrectomy membrane peel right eye

## 2021-06-20 NOTE — Progress Notes (Signed)
06/20/2021     CHIEF COMPLAINT Patient presents for  Chief Complaint  Patient presents with   Retina Evaluation      HISTORY OF PRESENT ILLNESS: Christopher Hart. is a 60 y.o. male who presents to the clinic today for:   HPI     Retina Evaluation   In both eyes.  This started 3 months ago.  Duration of 3 months.  Context:  distance vision, mid-range vision and near vision.  Treatments tried include no treatments.        Comments   NP presents for evaluation of epiretinal membrane in the right eye referred from Dr. Talbert Forest.  Pt c/o decreasing vision in the right eye, starting ~ 3 months ago, describes as having a fog over his vision, states his vision fluctuates.   EyeMeds: Pt states Dr. Talbert Forest recently prescribed him a new eye drop, he is not sure what the drop is or what it is to treat, has not used yet      Last edited by Reather Littler, COA on 06/20/2021  2:26 PM.      Referring physician: Darleen Crocker, MD [address] [place],  [place] [address]  HISTORICAL INFORMATION:   Selected notes from the MEDICAL RECORD NUMBER    Lab Results  Component Value Date   HGBA1C 5.5 06/12/2017     CURRENT MEDICATIONS: No current outpatient medications on file. (Ophthalmic Drugs)   No current facility-administered medications for this visit. (Ophthalmic Drugs)   Current Outpatient Medications (Other)  Medication Sig   aspirin 81 MG chewable tablet Chew 1 tablet (81 mg total) daily by mouth. (Patient not taking: Reported on 05/19/2021)   atorvastatin (LIPITOR) 80 MG tablet Take 1 tablet (80 mg total) by mouth daily. (Patient not taking: Reported on 05/19/2021)   efavirenz-emtricitabine-tenofovir (ATRIPLA) 600-200-300 MG tablet Take 1 tablet by mouth at bedtime.   LORazepam (ATIVAN) 1 MG tablet Take 1 tablet by mouth every 8 (eight) hours as needed. (Patient not taking: Reported on 05/19/2021)   meclizine (ANTIVERT) 25 MG tablet Take 1 tablet (25 mg total)  by mouth 3 (three) times daily as needed for dizziness or nausea.   metoprolol succinate (TOPROL XL) 25 MG 24 hr tablet Take 1 tablet (25 mg total) by mouth daily.   nitroGLYCERIN (NITROSTAT) 0.4 MG SL tablet Place 1 tablet (0.4 mg total) under the tongue every 5 (five) minutes as needed for chest pain.   ondansetron (ZOFRAN-ODT) 4 MG disintegrating tablet Take 4 mg by mouth every 8 (eight) hours as needed for nausea or vomiting.   prasugrel (EFFIENT) 10 MG TABS tablet Take 1 tablet (10 mg total) by mouth daily.   No current facility-administered medications for this visit. (Other)      REVIEW OF SYSTEMS:    ALLERGIES Allergies  Allergen Reactions   Contrast Media [Iodinated Diagnostic Agents]     Per pt, he was okay the last time he received.   Penicillins     [place]ps  Pt reports no longer allergic per [facility]    PAST MEDICAL HISTORY Past Medical History:  Diagnosis Date   Allergic rhinitis    Anxiety    BCE (basal cell epithelioma)    Has had 78 basal cell skin cancer lesions   CAD (coronary artery disease)    a. Cath 06/13/17: Mid Cx 100% stenosed-->Med Rx. 40% stenosis of dist RCA, distal LAD. 30% stenosis prox RCA, prox LAD; b. 05/2021 NSTEMI/PCI: LM nl, LAD  50p, 80p/m (iFR 0.83-->3.0x22 Onyx Frontier DES), 39m 70d, D1 25, LCX 90d, OM2 70, RCA 50ost/p, 45d, RPAV 90.   Congenital cataract of both eyes    Initially legally blind, but sight improved with cataract removal   Diastolic dysfunction    a. 05/2021 Echo: EF 65-70%, no rwma, GRI DD, nl RV size/fxn, nl RVSP. Triv MR.   Difficulty walking    Idiopathic peripheral neuropathy.  Followed by neurologist.  EMG 01/20/2021: Abnormal.  Sensorimotor peripheral neuropathy with axonal features.  No conduction blocks.  No evidence of myopathy.   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   GERD (gastroesophageal reflux disease)    HIV infection (HCC)    Low viral load. Takes medication.  Managed by primary care.   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 Sees neurologist, Dr. PMelrose Nakayama   Vertigo    Past Surgical History:  Procedure Laterality Date   Cataract surgery Bilateral    CORONARY STENT INTERVENTION N/A 05/11/2021   Procedure: CORONARY STENT INTERVENTION;  Surgeon: ENelva Bush MD;  [facility];  Service: Cardiovascular;  Laterality: N/A;   INTRAVASCULAR PRESSURE WIRE/FFR STUDY N/A 05/11/2021   Procedure: INTRAVASCULAR PRESSURE WIRE/FFR STUDY;  Surgeon: ENelva Bush MD;  [facility];  Service: Cardiovascular;  Laterality: N/A;   INTRAVASCULAR ULTRASOUND/IVUS N/A 05/11/2021   Procedure: Intravascular Ultrasound/IVUS;  Surgeon: ENelva Bush MD;  [facility];  Service: Cardiovascular;  Laterality: N/A;   LEFT HEART CATH AND CORONARY ANGIOGRAPHY N/A 06/13/2017   Procedure: LEFT HEART CATH AND CORONARY ANGIOGRAPHY;  Surgeon: GMinna Merritts MD;  [facility];  Service: Cardiovascular;  Laterality: N/A;   LEFT HEART CATH AND CORONARY ANGIOGRAPHY N/A 05/11/2021   Procedure: LEFT HEART CATH AND CORONARY ANGIOGRAPHY;  Surgeon: ENelva Bush MD;  [facility];  Service: Cardiovascular;  Laterality: N/A;    FAMILY HISTORY Family History  Problem Relation Age of Onset   CAD Mother        a. MI at age 62  CAD Maternal Grandmother    CAD Paternal Grandmother     SOCIAL HISTORY Social History   Tobacco Use   Smoking status: Never   Smokeless tobacco: Never  Vaping Use   Vaping Use: Never used  Substance Use Topics   Alcohol use: Yes    Comment: rare   Drug use: No         OPHTHALMIC EXAM:  Base Eye Exam     Visual Acuity (ETDRS)        Right Left   Dist [place] 20/125 20/50   Dist ph [place] 20/63 -2 20/40 -2         Tonometry (Tonopen, 2:35 PM)       Right Left   Pressure 12 11         Pupils       Pupils Dark Light Shape React APD   Right PERRL 5 4 Round Brisk None   Left PERRL 5 4 Round Brisk None         Visual Fields (Counting fingers)       Left Right    Full    Restrictions  Partial  outer inferior nasal deficiency         Extraocular Movement       Right Left    Full, Ortho Full, Ortho         Neuro/Psych     Oriented x3: Yes   Mood/Affect: Normal         Dilation     Both eyes: 1.0% Mydriacyl, 2.5% Phenylephrine @ 2:34 PM           Slit Lamp and Fundus Exam     External Exam       Right Left   External Normal Normal         Slit Lamp Exam       Right Left   Lids/Lashes Normal Normal   Conjunctiva/Sclera White and quiet White and quiet   Cornea Clear Clear   Anterior Chamber Deep and quiet Deep and quiet   Iris Round and reactive Round and reactive   Lens Centered posterior chamber intraocular lens, 1+ Posterior capsular opacification Centered posterior chamber intraocular lens, Open posterior capsule   Anterior Vitreous Normal Normal         Fundus Exam       Right Left   Posterior Vitreous Posterior vitreous detachment Posterior vitreous detachment   Disc Normal Normal   C/D Ratio 0.0 0.0   Macula Epiretinal membrane, Cystoid macular edema Epiretinal membrane, no detectable CME clinically   Vessels Normal Normal   Periphery Normal, peripheral retinopexy superotemporal, appears to be retinal break.  No new breaks Normal, no new breaks            IMAGING AND PROCEDURES  Imaging and Procedures for 06/20/21  OCT, Retina - OU - Both Eyes       Right Eye Quality was good. Scan locations included subfoveal. Central Foveal Thickness: 502. Findings include cystoid macular edema, epiretinal membrane.   Left Eye Scan locations included subfoveal.  Central Foveal Thickness: 391. Findings include cystoid macular edema, epiretinal membrane.   Notes OU with bilateral CME OD worse than OS which could be secondary to ERM or could be triggering and ERM.  Will recommend fluorescein angiography right left     Color Fundus Photography Optos - OU - Both Eyes       Right Eye Progression has no prior data. Disc findings include normal observations. Macula : epiretinal membrane. Vessels : normal observations. Periphery : normal observations.   Left Eye Progression has no prior data. Disc findings include normal observations. Macula : epiretinal membrane. Vessels : normal observations. Periphery : normal observations.   Notes No obvious retinal vascular changes OU     Fluorescein Angiography Optos (Transit OD)       Injection: 500 mg Fluorescein Sodium 10 %   Route: Intravenous   NDC: 4128-7867-67   Right Eye Early phase findings include leakage, microaneurysm. Mid/Late phase findings include leakage, microaneurysm. Choroidal neovascularization is not present.   Left Eye   Progression has no prior data. Mid/Late phase findings include leakage. Choroidal neovascularization is not present.   Notes Moderate to severe angiographic CME OD.  No signs of vasculitis  , Late onset disc hyperfluorescence, , no sign of vasculitis nor retinal vascular occlusion  OS with mild to moderateAngiographic CME             ASSESSMENT/PLAN:  Epiretinal membrane, right eye Epiretinal membrane OD, with secondary CME or possibly secondary to the CME.  Will need fluorescein angiography to look for signs of MAC-TEL  OD, will  treat empirically CME to see if CME causative of the ERM.  If not improved may need to consider vitrectomy membrane peel right eye  Cystoid macular edema of right eye Late onset post cataract surgery from 2018 thus not likely be primary pseudophakic CME.  OD will need fluorescein angiography right left eye to sort out  whether this is primary CME or rather CME secondary to MAC-TEL   I discussed our plan with the patient.  We will use topical ketorolac 1 drop right eye 4 times daily for up to 6 to 8 weeks to look for signs of resolution of CME.  If CME resolves this is not MAC-TEL.  If that does not resolve MAC-TEL could be present and I have not in the case urged the patient undergo home sleep study testing as he contacts his primary care provider for consideration of this home testing to be done.  I explained to the patient that if sleep apnea is present and untreated, this causes a starvation to the retina of oxygen on a nightly basis which can make his condition in his eye worse  Cystoid macular edema of left eye Minor CME seen only on OCT.  With minor epiretinal membrane  Late onset post cataract surgery from 2018 thus not likely be primary pseudophakic CME.  OD will need fluorescein angiography right left eye to sort out whether this is primary CME or rather CME secondary to MAC-TEL   I discussed our plan with the patient.  We will use topical ketorolac 1 drop right eye 4 times daily for up to 6 to 8 weeks to look for signs of resolution of CME.  If CME resolves this is not MAC-TEL.  If that does not resolve MAC-TEL could be present and I have not in the case urged the patient undergo home sleep study testing as he contacts his primary care provider for consideration of this home testing to be done.  I explained to the patient that if sleep apnea is present and untreated, this causes a starvation to the retina of oxygen on a nightly basis which can make his condition in his eye worse   Right posterior capsular opacification Mild to moderate posterior capsule opacification not likely the cause of blurred vision in the right eye in the presence of epiretinal membrane and fairly severe CME OD   Okay to proceed with YAG capsulotomy right eye at any time as the patient will be taking topical NSAID as well  as apparently Pred forte has been prescribed for him as well, by Dr. Talbert Forest  Left epiretinal membrane Minor epiretinal membrane     ICD-10-CM   1. Epiretinal membrane, right eye  H35.371 OCT, Retina - OU - Both Eyes    Color Fundus Photography Optos - OU - Both Eyes    Fluorescein Angiography Optos (Transit OD)    Fluorescein Sodium 10 % injection 500 mg    2. Left epiretinal membrane  H35.372 OCT, Retina - OU - Both Eyes    Color Fundus Photography Optos - OU - Both Eyes    Fluorescein Angiography Optos (Transit OD)    Fluorescein Sodium 10 % injection 500 mg    3. Cystoid macular edema of right eye  H35.351 OCT, Retina - OU - Both Eyes    Color Fundus Photography Optos - OU - Both Eyes    Fluorescein Angiography Optos (Transit OD)    Fluorescein Sodium 10 % injection 500 mg    4. Cystoid macular  edema of left eye  H35.352 OCT, Retina - OU - Both Eyes    Color Fundus Photography Optos - OU - Both Eyes    Fluorescein Angiography Optos (Transit OD)    Fluorescein Sodium 10 % injection 500 mg    5. Right posterior capsular opacification  H26.491       1.  OD may proceed with YAG capsulotomy if so indicated under direction of Dr. Talbert Forest  2.  OU will commence with topical therapy for pseudophakic CME late onset.  If CME resolves, visual acuity will maximize and the epiretinal membrane is not causative  3.  If CME does not improve, ERM could be causative OU also this could be MAC-TEL.  I did asked the patient to consider contacting his primary care physician with consideration of a home sleep study since he does snore and have nocturia.  Ophthalmic Meds Ordered this visit:  Meds ordered this encounter  Medications   Fluorescein Sodium 10 % injection 500 mg       Return in about 4 weeks (around 07/18/2021) for DILATE OU, OCT.  There are no Patient Instructions on file for this visit.   Explained the diagnoses, plan, and follow up with the patient and they expressed  understanding.  Patient expressed understanding of the importance of proper follow up care.   Clent Demark Amir Fick M.D. Diseases & Surgery of the Retina and Vitreous [facility]ina [facility] [place] 06/20/21     Abbreviations: M myopia (nearsighted); A astigmatism; H hyperopia (farsighted); P presbyopia; Mrx spectacle prescription;  CTL contact lenses; OD right eye; OS left eye; OU both eyes  XT exotropia; ET esotropia; PEK punctate epithelial keratitis; PEE punctate epithelial erosions; DES dry eye syndrome; MGD meibomian gland dysfunction; ATs artificial tears; PFAT's preservative free artificial tears; [place] nuclear sclerotic cataract; PSC posterior subcapsular cataract; ERM epi-retinal membrane; PVD posterior vitreous detachment; RD retinal detachment; DM diabetes mellitus; DR diabetic retinopathy; NPDR non-proliferative diabetic retinopathy; PDR proliferative diabetic retinopathy; CSME clinically significant macular edema; DME diabetic macular edema; dbh dot blot hemorrhages; CWS cotton wool spot; POAG primary open angle glaucoma; C/D cup-to-disc ratio; HVF humphrey visual field; GVF goldmann visual field; OCT optical coherence tomography; IOP intraocular pressure; BRVO Branch retinal vein occlusion; CRVO central retinal vein occlusion; CRAO central retinal artery occlusion; BRAO branch retinal artery occlusion; RT retinal tear; SB scleral buckle; PPV pars plana vitrectomy; VH Vitreous hemorrhage; PRP panretinal laser photocoagulation; IVK intravitreal kenalog; VMT vitreomacular traction; MH Macular hole;  NVD neovascularization of the disc; NVE neovascularization elsewhere; AREDS age related eye disease study; ARMD age related macular degeneration; POAG primary open angle glaucoma; EBMD epithelial/anterior basement membrane dystrophy; ACIOL anterior chamber intraocular lens; IOL intraocular lens; PCIOL posterior chamber intraocular lens; Phaco/IOL phacoemulsification with intraocular lens placement; [place]  photorefractive keratectomy; LASIK laser assisted in situ keratomileusis; HTN hypertension; DM diabetes mellitus; COPD chronic obstructive pulmonary disease

## 2021-06-20 NOTE — Assessment & Plan Note (Addendum)
Minor CME seen only on OCT.  With minor epiretinal membrane  Late onset post cataract surgery from 2018 thus not likely be primary pseudophakic CME.  OD will need fluorescein angiography right left eye to sort out whether this is primary CME or rather CME secondary to MAC-TEL   I discussed our plan with the patient.  We will use topical ketorolac 1 drop right eye 4 times daily for up to 6 to 8 weeks to look for signs of resolution of CME.  If CME resolves this is not MAC-TEL.  If that does not resolve MAC-TEL could be present and I have not in the case urged the patient undergo home sleep study testing as he contacts his primary care provider for consideration of this home testing to be done.  I explained to the patient that if sleep apnea is present and untreated, this causes a starvation to the retina of oxygen on a nightly basis which can make his condition in his eye worse

## 2021-06-20 NOTE — Assessment & Plan Note (Addendum)
Late onset post cataract surgery from 2018 thus not likely be primary pseudophakic CME.  OD will need fluorescein angiography right left eye to sort out whether this is primary CME or rather CME secondary to MAC-TEL   I discussed our plan with the patient.  We will use topical ketorolac 1 drop right eye 4 times daily for up to 6 to 8 weeks to look for signs of resolution of CME.  If CME resolves this is not MAC-TEL.  If that does not resolve MAC-TEL could be present and I have not in the case urged the patient undergo home sleep study testing as he contacts his primary care provider for consideration of this home testing to be done.  I explained to the patient that if sleep apnea is present and untreated, this causes a starvation to the retina of oxygen on a nightly basis which can make his condition in his eye worse

## 2021-06-20 NOTE — Assessment & Plan Note (Signed)
Minor epiretinal membrane

## 2021-06-21 ENCOUNTER — Ambulatory Visit (INDEPENDENT_AMBULATORY_CARE_PROVIDER_SITE_OTHER): Payer: BC Managed Care – PPO | Admitting: Physician Assistant

## 2021-06-21 ENCOUNTER — Encounter: Payer: Self-pay | Admitting: Physician Assistant

## 2021-06-21 VITALS — BP 152/92 | HR 96 | Ht 70.0 in | Wt 228.0 lb

## 2021-06-21 DIAGNOSIS — B20 Human immunodeficiency virus [HIV] disease: Secondary | ICD-10-CM

## 2021-06-21 DIAGNOSIS — I2510 Atherosclerotic heart disease of native coronary artery without angina pectoris: Secondary | ICD-10-CM

## 2021-06-21 DIAGNOSIS — A523 Neurosyphilis, unspecified: Secondary | ICD-10-CM

## 2021-06-21 DIAGNOSIS — I214 Non-ST elevation (NSTEMI) myocardial infarction: Secondary | ICD-10-CM

## 2021-06-21 DIAGNOSIS — E785 Hyperlipidemia, unspecified: Secondary | ICD-10-CM

## 2021-06-21 DIAGNOSIS — Z21 Asymptomatic human immunodeficiency virus [HIV] infection status: Secondary | ICD-10-CM

## 2021-06-21 DIAGNOSIS — G4730 Sleep apnea, unspecified: Secondary | ICD-10-CM

## 2021-06-21 DIAGNOSIS — I10 Essential (primary) hypertension: Secondary | ICD-10-CM

## 2021-06-21 NOTE — Patient Instructions (Signed)
Medication Instructions:  No changes at this time.  *If you need a refill on your cardiac medications before your next appointment, please call your pharmacy*   Lab Work: None  If you have labs (blood work) drawn today and your tests are completely normal, you will receive your results only by: [place] (if you have MyChart) OR A paper copy in the mail If you have any lab test that is abnormal or we need to change your treatment, we will call you to review the results.   Testing/Procedures: None   Follow-Up: At [facility], you and your health needs are our priority.  As part of our continuing mission to provide you with exceptional heart care, we have created designated Provider Care Teams.  These Care Teams include your primary Cardiologist (physician) and Advanced Practice Providers (APPs -  Physician Assistants and Nurse Practitioners) who all work together to provide you with the care you need, when you need it.   Your next appointment:   3 month(s)  The format for your next appointment:   In Person  Provider:   Ida Rogue, MD or Christell Faith, PA-C

## 2021-06-22 DIAGNOSIS — A523 Neurosyphilis, unspecified: Secondary | ICD-10-CM | POA: Diagnosis not present

## 2021-06-24 DIAGNOSIS — A523 Neurosyphilis, unspecified: Secondary | ICD-10-CM | POA: Diagnosis not present

## 2021-06-25 DIAGNOSIS — A523 Neurosyphilis, unspecified: Secondary | ICD-10-CM | POA: Diagnosis not present

## 2021-06-26 DIAGNOSIS — A523 Neurosyphilis, unspecified: Secondary | ICD-10-CM | POA: Diagnosis not present

## 2021-06-27 DIAGNOSIS — A523 Neurosyphilis, unspecified: Secondary | ICD-10-CM | POA: Diagnosis not present

## 2021-06-28 DIAGNOSIS — A523 Neurosyphilis, unspecified: Secondary | ICD-10-CM | POA: Diagnosis not present

## 2021-06-29 DIAGNOSIS — A523 Neurosyphilis, unspecified: Secondary | ICD-10-CM | POA: Diagnosis not present

## 2021-07-03 DIAGNOSIS — A523 Neurosyphilis, unspecified: Secondary | ICD-10-CM | POA: Diagnosis not present

## 2021-07-07 ENCOUNTER — Telehealth: Payer: Self-pay | Admitting: Physician Assistant

## 2021-07-07 DIAGNOSIS — A523 Neurosyphilis, unspecified: Secondary | ICD-10-CM | POA: Diagnosis not present

## 2021-07-07 DIAGNOSIS — D432 Neoplasm of uncertain behavior of brain, unspecified: Secondary | ICD-10-CM | POA: Diagnosis not present

## 2021-07-07 DIAGNOSIS — I214 Non-ST elevation (NSTEMI) myocardial infarction: Secondary | ICD-10-CM | POA: Diagnosis not present

## 2021-07-07 NOTE — Telephone Encounter (Signed)
I received epic chat message from Dr. Elayne Guerin, with [facility] neurosurgery, requesting: "Recommended an anterior cervical discectomy and fusion C5-7 due to moderate and severe stenosis.  He is quite symptomatic from this.  He was stented in early October of this year.  I would need him to be off prasugrel for a total of 3 weeks (1 week prior, 2 weeks after), but he could stay on a baby aspirin.  When is the soonest you would feel comfortable with him stopping prasugrel for 3 weeks?"  He stated this is typically a "within 4 weeks" surgery.  I will route to the interventional cardiologist at that performed the cath for formal recommendation regarding the discontinuation of prasugrel.  This information will then be conveyed to Dr. Cari Caraway.

## 2021-07-07 NOTE — Telephone Encounter (Signed)
The longer we are able to delay surgery, the better it would be from a PCI standpoint.  However, data suggests that as little as 1 month of dual antiplatelet therapy is acceptable with Onyx Frontier stent in high bleeding risk patients.  If Dr. Izora Ribas thinks it is okay to defer surgery 3-4 weeks, I would favor planning on surgery in late December with discontinuation of prasugrel 1 week before the surgery and resumption when Dr. Izora Ribas feels it is appropriate.  Nelva Bush, MD [facility]

## 2021-07-11 DIAGNOSIS — R739 Hyperglycemia, unspecified: Secondary | ICD-10-CM | POA: Diagnosis not present

## 2021-07-11 DIAGNOSIS — I10 Essential (primary) hypertension: Secondary | ICD-10-CM | POA: Diagnosis not present

## 2021-07-11 DIAGNOSIS — A523 Neurosyphilis, unspecified: Secondary | ICD-10-CM | POA: Diagnosis not present

## 2021-07-11 DIAGNOSIS — B20 Human immunodeficiency virus [HIV] disease: Secondary | ICD-10-CM | POA: Diagnosis not present

## 2021-07-11 DIAGNOSIS — I2510 Atherosclerotic heart disease of native coronary artery without angina pectoris: Secondary | ICD-10-CM | POA: Diagnosis not present

## 2021-07-18 ENCOUNTER — Encounter (INDEPENDENT_AMBULATORY_CARE_PROVIDER_SITE_OTHER): Payer: BC Managed Care – PPO | Admitting: Ophthalmology

## 2021-08-16 DIAGNOSIS — H26491 Other secondary cataract, right eye: Secondary | ICD-10-CM | POA: Diagnosis not present

## 2021-08-23 DIAGNOSIS — A523 Neurosyphilis, unspecified: Secondary | ICD-10-CM | POA: Diagnosis not present

## 2021-08-23 DIAGNOSIS — R479 Unspecified speech disturbances: Secondary | ICD-10-CM | POA: Diagnosis not present

## 2021-08-23 DIAGNOSIS — R42 Dizziness and giddiness: Secondary | ICD-10-CM | POA: Diagnosis not present

## 2021-08-23 DIAGNOSIS — G510 Bell's palsy: Secondary | ICD-10-CM | POA: Diagnosis not present

## 2021-09-22 NOTE — Progress Notes (Signed)
Cardiology Office Note  Date:  09/23/2021   ID:  [name], DOB [birth year], MRN [id number]  PCP:  Gladstone Lighter, MD   Chief Complaint  Patient presents with   3 month follow up     Patient c/o chest pain about two weeks ago. Medications reviewed by the patient verbally.      HPI:  Christopher [facility]. is a 61 y.o. male with history of  CAD with NSTEMI in 06/2017  NSTEMI in 05/2021 status post PCI to the LAD,  HIV, neurosyphilis,  HTN,  HLD,  Bell's palsy in 12/2020,  vertigo, subpendymoma mass stable since 2018,  peripheral neuropathy, depression, anxiety, and obesity who presents for follow up of CAD.   Recent hospital events reviewed in detail In the hospital from 10/3 through 05/12/21 with an NSTEMI.   Echo showed an EF of 65 to 70%, no regional wall motion abnormalities, mild LVH, grade 1 diastolic dysfunction, normal RV systolic function and ventricular cavity size, normal PASP, trivial mitral regurgitation, estimated right atrial pressure 3 mmHg.     LHC on 05/11/2021 which demonstrated significant three-vessel CAD including sequential 50% proximal, 80% mid, 40% mid, and 70% distal LAD stenoses, diffusely diseased small distal LCx and OM2 with up to 90% stenosis, and sequential 50% ostial/proximal RCA, 40 to 50% distal RCA, and 90% distal RPA V lesions.  successful IFR and IVUS guided PCI to the mid LAD.   A small D2 branch was jailed by the stent with with reduction in flow from TIMI-2 to TIMI-0 and collateralization via left to left collaterals.     Currently taking aspirin with Effient.    Stepped funny at [facility], stepped in a hole Got chest pain, seem to resolve on its own but scared him  Uses a cane to get around, neurology has ordered a walker  Walks dog, on a regular basis,  large pit bull Weight up 15 pounds it would appear per the notes over the past several months  EKG personally reviewed by myself on todays visit Nsr rate 89 bpm no ST or T  eave changes  Other past medical history reviewed hospital in 06/2017 with an NSTEMI.    Echo showed an EF of 60 to 65%, no regional wall motion abnormalities, normal LV diastolic function parameters, mild mitral regurgitation, mildly dilated left atrium, normal RV systolic function and PASP.    LHC showed a large LAD with 30 to 40% proximal/mid/distal disease, occluded mid/distal LCx, 30% proximal RCA stenosis, and 40% distal RCA stenosis.   lost to follow-up.    hospital in 12/2020 with Bell's palsy and found to have a subpendymoma mass that was stable since 2018, which is followed by neurology.   MRI was negative for acute CVA.   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  PSH:    Past Surgical History:  Procedure Laterality Date   Cataract surgery Bilateral    CORONARY STENT INTERVENTION N/A 05/11/2021   Procedure: CORONARY STENT INTERVENTION;  Surgeon: Nelva Bush, MD;  Location: [facility] [facility];  Service: Cardiovascular;  Laterality: N/A;   INTRAVASCULAR PRESSURE WIRE/FFR STUDY N/A 05/11/2021   Procedure: INTRAVASCULAR PRESSURE WIRE/FFR STUDY;  Surgeon: Nelva Bush, MD;  Location: [place] [facility];  Service: Cardiovascular;  Laterality: N/A;   INTRAVASCULAR ULTRASOUND/IVUS N/A 05/11/2021   Procedure: Intravascular Ultrasound/IVUS;  Surgeon: Nelva Bush, MD;  Location: [facility];  Service: Cardiovascular;  Laterality: N/A;   LEFT HEART CATH AND CORONARY ANGIOGRAPHY N/A 06/13/2017   Procedure: LEFT HEART CATH AND CORONARY ANGIOGRAPHY;  Surgeon: Minna Merritts, MD;  Location: [facility];  Service: Cardiovascular;  Laterality: N/A;   LEFT HEART CATH AND CORONARY ANGIOGRAPHY N/A 05/11/2021   Procedure: LEFT HEART CATH AND CORONARY ANGIOGRAPHY;  Surgeon: Nelva Bush, MD;  Location: [facility];  Service: Cardiovascular;  Laterality: N/A;    Current Outpatient Medications  Medication Sig Dispense Refill   aspirin 81 MG chewable tablet Chew 1 tablet (81 mg total) daily by mouth. 30 tablet 0   atorvastatin (LIPITOR) 80 MG tablet Take 1 tablet (80 mg total) by mouth daily. 30 tablet 0   efavirenz-emtricitabine-tenofovir (ATRIPLA) 600-200-300 MG tablet Take 1 tablet by mouth at bedtime.     gabapentin (NEURONTIN) 300 MG capsule Take 1 capsule by mouth in the morning and at bedtime.     ketorolac (ACULAR) 0.5 % ophthalmic solution Place 1 drop into both eyes 4 (four) times daily. 5 mL 5   LORazepam (ATIVAN) 1 MG tablet Take 1 tablet by mouth every 8 (eight) hours as needed.     meclizine (ANTIVERT) 25 MG tablet Take 1 tablet (25 mg total) by mouth 3 (three) times daily as needed for dizziness or nausea. 30 tablet 1   metoprolol succinate (TOPROL XL) 25 MG 24 hr tablet Take 1 tablet (25 mg total) by mouth daily. 90 tablet 3   nitroGLYCERIN (NITROSTAT) 0.4 MG SL tablet Place 1 tablet (0.4 mg total) under the tongue every 5 (five) minutes as needed for chest pain. 14 tablet 12   nortriptyline (PAMELOR) 10 MG capsule Take 10 mg by mouth 2 (two) times daily.     ondansetron (ZOFRAN-ODT) 4 MG disintegrating tablet Take 4 mg by mouth every 8 (eight) hours as needed for nausea or vomiting.     prasugrel (EFFIENT) 10 MG TABS tablet Take 1 tablet (10 mg total) by mouth daily. 30 tablet 11   sertraline (ZOLOFT) 25 MG tablet Take 1 tablet by mouth in the morning and at bedtime.     No current facility-administered medications for this visit.     Allergies:   Shellfish allergy, Penicillin g, Contrast media [iodinated contrast media], and Penicillins   Social  History:  The patient  reports that he has never smoked. He has never used smokeless tobacco. He reports current alcohol use. He reports that he does not use drugs.   Family History:   family history includes CAD in his maternal grandmother, mother, and paternal grandmother.    Review of Systems: Review of Systems  Constitutional: Negative.   HENT: Negative.    Respiratory: Negative.    Cardiovascular: Negative.   Gastrointestinal: Negative.   Musculoskeletal: Negative.   Neurological: Negative.   Psychiatric/Behavioral: Negative.    All other systems reviewed and are negative.   PHYSICAL EXAM: VS:  BP (!) 160/100 (BP Location: Left Arm, Patient Position: Sitting, Cuff Size: Normal)    Pulse 89    Ht 5\' 6"  (1.676 m)    Wt 231 lb 6 oz (105 kg)    SpO2 99%    BMI 37.34 kg/m  , BMI Body mass index is 37.34 kg/m. GEN: Well nourished, well developed,  in no acute distress HEENT: normal Neck: no JVD, carotid bruits, or masses Cardiac: RRR; no murmurs, rubs, or gallops,no edema  Respiratory:  clear to auscultation bilaterally, normal work of breathing GI: soft, nontender, nondistended, + BS MS: no deformity or atrophy Skin: warm and dry, no rash Neuro:  Strength and sensation are intact Psych: euthymic mood, full affect  Recent Labs: 12/24/2020: ALT 23 05/09/2021: Magnesium 1.9 05/12/2021: BUN 21; Creatinine, Ser 1.08; Hemoglobin 15.4; Platelets 391; Potassium 3.6; Sodium 135    Lipid Panel Lab Results  Component Value Date   CHOL 123 05/10/2021   HDL 28 (L) 05/10/2021   LDLCALC 70 05/10/2021   TRIG 123 05/10/2021      Wt Readings from Last 3 Encounters:  09/23/21 231 lb 6 oz (105 kg)  06/21/21 228 lb (103.4 kg)  05/19/21 216 lb 3.2 oz (98.1 kg)     ASSESSMENT AND PLAN:  Problem List Items Addressed This Visit       Cardiology Problems   Coronary artery disease involving native coronary artery of native heart with unstable angina pectoris ([place]) - Primary   Relevant  Orders   EKG 12-Lead   Other Visit Diagnoses     Essential hypertension       Relevant Orders   EKG 12-Lead   Hyperlipidemia LDL goal <70       HIV infection, unspecified symptom status (HCC)       Sleep-disordered breathing          Coronary disease with stable angina Recent episode of chest pain while [facility] [place] [facility] [employer], stepped in a hole Symptoms resolved without intervention There is residual small vessel disease He does report having nitro sublingual Recommend for recurrent pain he take a sublingual nitro Would also recommend we start isosorbide 30 mg daily given small vessel disease and high blood pressure We will continue aspirin Effient for now given recent anginal symptoms  Essential hypertension Blood pressure elevated, add isosorbide 30, recommend monitoring blood pressure at home  Hyperlipidemia We have encouraged continued exercise, careful diet management in an effort to lose weight. Ideally would like LDL less than 70 , may run higher with weight gain     Total encounter time more than 40 minutes  Greater than 50% was spent in counseling and coordination of care with the patient    Signed, Esmond Plants, M.D., Ph.D. [place], [place]

## 2021-09-23 ENCOUNTER — Other Ambulatory Visit: Payer: Self-pay

## 2021-09-23 ENCOUNTER — Encounter: Payer: Self-pay | Admitting: Cardiovascular Disease

## 2021-09-23 ENCOUNTER — Ambulatory Visit (INDEPENDENT_AMBULATORY_CARE_PROVIDER_SITE_OTHER): Payer: BC Managed Care – PPO | Admitting: Cardiovascular Disease

## 2021-09-23 VITALS — BP 160/100 | HR 89 | Ht 66.0 in | Wt 231.4 lb

## 2021-09-23 DIAGNOSIS — I10 Essential (primary) hypertension: Secondary | ICD-10-CM | POA: Diagnosis not present

## 2021-09-23 DIAGNOSIS — Z21 Asymptomatic human immunodeficiency virus [HIV] infection status: Secondary | ICD-10-CM

## 2021-09-23 DIAGNOSIS — B20 Human immunodeficiency virus [HIV] disease: Secondary | ICD-10-CM | POA: Diagnosis not present

## 2021-09-23 DIAGNOSIS — G4730 Sleep apnea, unspecified: Secondary | ICD-10-CM

## 2021-09-23 DIAGNOSIS — E785 Hyperlipidemia, unspecified: Secondary | ICD-10-CM | POA: Diagnosis not present

## 2021-09-23 DIAGNOSIS — I25110 Atherosclerotic heart disease of native coronary artery with unstable angina pectoris: Secondary | ICD-10-CM

## 2021-09-23 MED ORDER — METOPROLOL SUCCINATE ER 25 MG PO TB24: 25.0000 mg | ORAL_TABLET | Freq: Every day | ORAL | 3 refills | Status: AC

## 2021-09-23 MED ORDER — ATORVASTATIN CALCIUM 80 MG PO TABS: 80.0000 mg | ORAL_TABLET | Freq: Every day | ORAL | 3 refills | Status: DC

## 2021-09-23 MED ORDER — ISOSORBIDE MONONITRATE ER 30 MG PO TB24: 30.0000 mg | ORAL_TABLET | Freq: Every day | ORAL | 3 refills | Status: DC

## 2021-09-23 NOTE — Patient Instructions (Addendum)
Medication Instructions:  Please start  imdur 30 mg daily Monitor blood pressure, call with numbers in 2 weeks or upload to mychart for Dr. Rockey Situ to review   If you need a refill on your cardiac medications before your next appointment, please call your pharmacy.   Lab work: No new labs needed  Testing/Procedures: No new testing needed  Follow-Up: At [facility], you and your health needs are our priority.  As part of our continuing mission to provide you with exceptional heart care, we have created designated Provider Care Teams.  These Care Teams include your primary Cardiologist (physician) and Advanced Practice Providers (APPs -  Physician Assistants and Nurse Practitioners) who all work together to provide you with the care you need, when you need it.  You will need a follow up appointment in 6 months, APP ok  Providers on your designated Care Team:   Murray Hodgkins, NP Christell Faith, PA-C Cadence Kathlen Mody, [place]  COVID-19 Vaccine Information can be found at: [email or web address].uk For questions related to vaccine distribution or appointments, please email vaccine@[email or web address] .[email or web address] or call [phone].

## 2021-12-19 DIAGNOSIS — B20 Human immunodeficiency virus [HIV] disease: Secondary | ICD-10-CM | POA: Diagnosis not present

## 2021-12-19 DIAGNOSIS — I10 Essential (primary) hypertension: Secondary | ICD-10-CM | POA: Diagnosis not present

## 2021-12-19 DIAGNOSIS — A523 Neurosyphilis, unspecified: Secondary | ICD-10-CM | POA: Diagnosis not present

## 2021-12-19 DIAGNOSIS — R413 Other amnesia: Secondary | ICD-10-CM | POA: Diagnosis not present

## 2021-12-19 DIAGNOSIS — F411 Generalized anxiety disorder: Secondary | ICD-10-CM | POA: Diagnosis not present

## 2021-12-19 DIAGNOSIS — I2510 Atherosclerotic heart disease of native coronary artery without angina pectoris: Secondary | ICD-10-CM | POA: Diagnosis not present

## 2021-12-27 DIAGNOSIS — F331 Major depressive disorder, recurrent, moderate: Secondary | ICD-10-CM | POA: Diagnosis not present

## 2021-12-27 DIAGNOSIS — F419 Anxiety disorder, unspecified: Secondary | ICD-10-CM | POA: Diagnosis not present

## 2021-12-29 DIAGNOSIS — A523 Neurosyphilis, unspecified: Secondary | ICD-10-CM | POA: Diagnosis not present

## 2021-12-29 DIAGNOSIS — B20 Human immunodeficiency virus [HIV] disease: Secondary | ICD-10-CM | POA: Diagnosis not present

## 2022-01-03 DIAGNOSIS — H43813 Vitreous degeneration, bilateral: Secondary | ICD-10-CM | POA: Diagnosis not present

## 2022-01-16 ENCOUNTER — Other Ambulatory Visit: Payer: Self-pay | Admitting: Infectious Diseases

## 2022-01-16 DIAGNOSIS — A523 Neurosyphilis, unspecified: Secondary | ICD-10-CM

## 2022-01-16 DIAGNOSIS — B20 Human immunodeficiency virus [HIV] disease: Secondary | ICD-10-CM

## 2022-01-19 ENCOUNTER — Ambulatory Visit
Admission: RE | Admit: 2022-01-19 | Discharge: 2022-01-19 | Disposition: A | Discharge status: Home / Self Care | Payer: BC Managed Care – PPO | Source: Ambulatory Visit | Attending: Infectious Diseases | Admitting: Infectious Diseases

## 2022-01-19 DIAGNOSIS — A523 Neurosyphilis, unspecified: Secondary | ICD-10-CM | POA: Insufficient documentation

## 2022-01-19 DIAGNOSIS — B20 Human immunodeficiency virus [HIV] disease: Secondary | ICD-10-CM

## 2022-01-19 LAB — GLUCOSE, CSF: Glucose, CSF: 73 mg/dL — ABNORMAL HIGH (ref 40–70)

## 2022-01-19 LAB — CSF CELL COUNT WITH DIFFERENTIAL
Eosinophils, CSF: 0 %
Lymphs, CSF: 56 %
Monocyte-Macrophage-Spinal Fluid: 43 %
RBC Count, CSF: 73 /mm3 — ABNORMAL HIGH (ref 0–3)
Segmented Neutrophils-CSF: 1 %
Tube #: 3
WBC, CSF: 5 /mm3 (ref 0–5)

## 2022-01-19 LAB — PROTEIN, CSF: Total  Protein, CSF: 52 mg/dL — ABNORMAL HIGH (ref 15–45)

## 2022-01-19 IMAGING — RF DG FLUORO GUIDE SPINAL/SI JT INJ*L*
3 series · 4 of 4 positions shown · non-contrast
Comparison: None Available.

CLINICAL DATA: Request for lumbar puncture for neurosyphilis and
symptomatic HIV.

EXAM:
DIAGNOSTIC LUMBAR PUNCTURE UNDER FLUOROSCOPIC GUIDANCE

[Series 1: fluoro_iodine 2fps_bw · 0.17mm/px · 2 of 2 frames shown (1 of 2)]
[frame 1/2]
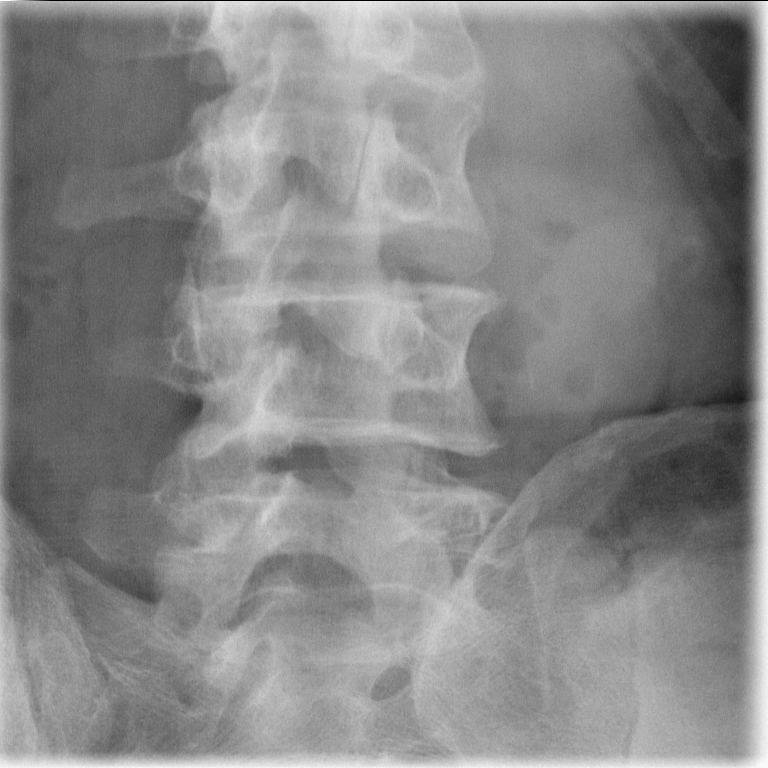
[frame 2/2]
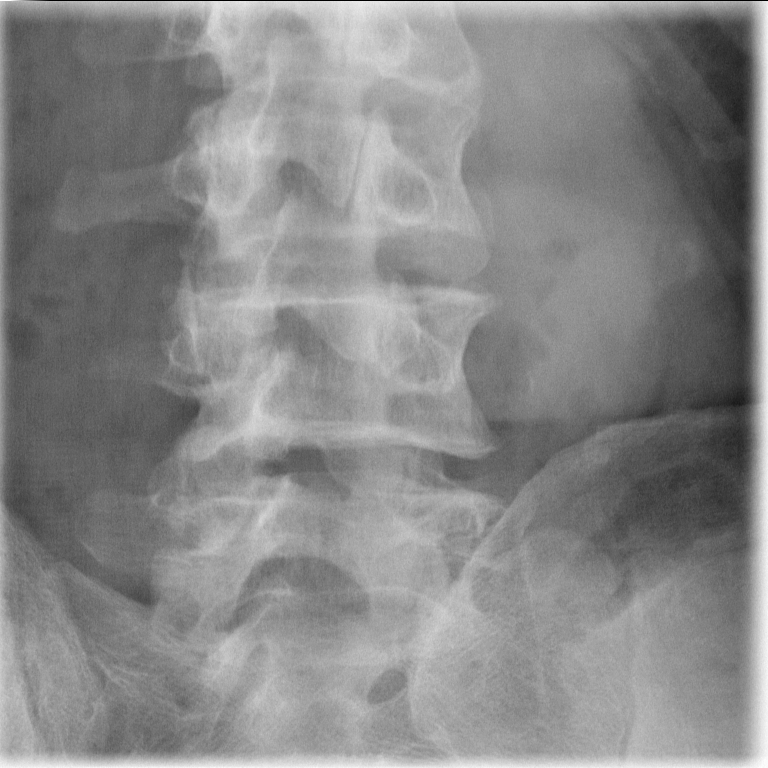

[Series 2: fluoro_iodine 2fps_bw · 0.17mm/px · 1 of 1 slices shown (2 of 2)]
[im 1/1]
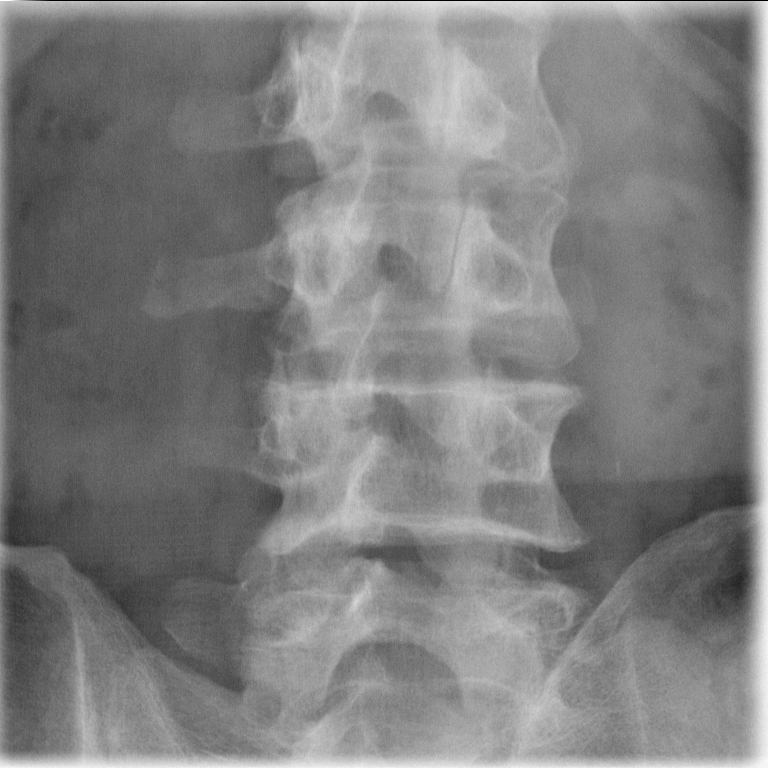

[Series 3: cp_standard · 0.17mm/px · 1 of 1 slices shown]
[im 1/1]
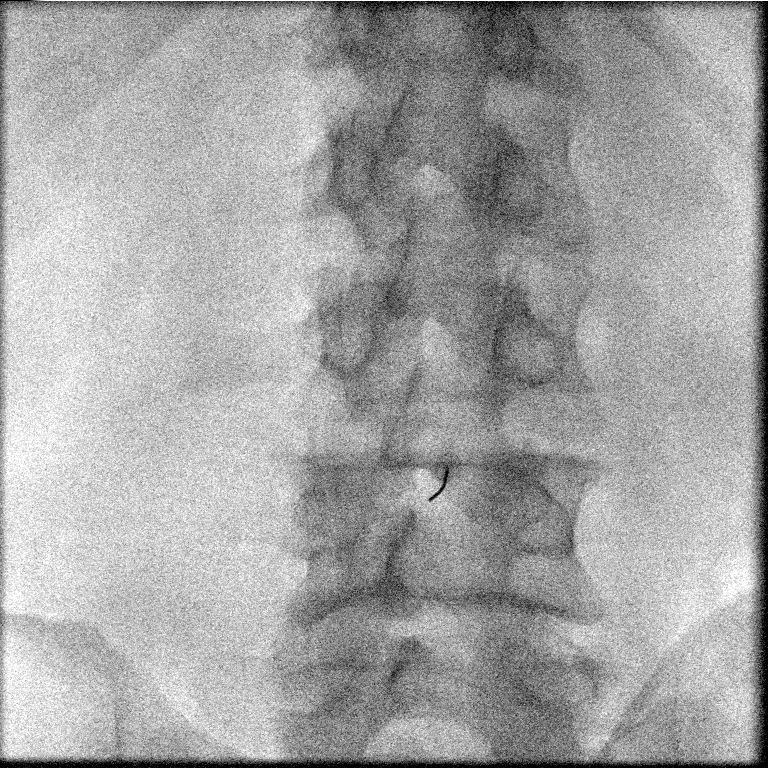

[4 of 4 positions shown; findings below may reference images not displayed]

FLUOROSCOPY:
Radiation Exposure Index (as provided by the fluoroscopic device):
37.50 mGy Kerma

PROCEDURE:
Informed consent was obtained from the patient prior to the
procedure, including potential complications of headache, allergy,
and pain. With the patient prone, the lower back was prepped with
Betadine. 1% Lidocaine was used for local anesthesia. Lumbar
puncture was performed at the L3-L4 level using a 22 gauge needle
with return of clear, colorless CSF. 11 ml of CSF were obtained for
laboratory studies. The patient tolerated the procedure well and
there were no apparent complications.
IMPRESSION: Technically successful fluoroscopic guided lumbar puncture performed
by NOMASIBULELE, NP and supervised by NOMASIBULELE. Samples were
sent to the laboratory as requested per the clinical team.

## 2022-01-19 MED ORDER — LIDOCAINE HCL (PF) 1 % IJ SOLN
5.0000 mL | Freq: Once | INTRAMUSCULAR | Status: AC
  Administered 2022-01-19: 5 mL

## 2022-01-19 NOTE — Progress Notes (Signed)
Pt handed off to [facility] in [place]

## 2022-01-19 NOTE — Procedures (Signed)
Technically successful fluoro guided LP at L3,L4 level 11 cc of clear, colorless CSF sent to lab for analysis.  No immediate post procedural complication.  Please see imaging section of Epic for full dictation.    Narda Rutherford, AGNP-BC 01/19/2022, 11:08 AM

## 2022-01-20 LAB — VDRL, CSF: VDRL Quant, CSF: NONREACTIVE

## 2022-01-24 DIAGNOSIS — F411 Generalized anxiety disorder: Secondary | ICD-10-CM | POA: Diagnosis not present

## 2022-01-24 DIAGNOSIS — F331 Major depressive disorder, recurrent, moderate: Secondary | ICD-10-CM | POA: Diagnosis not present

## 2022-01-26 DIAGNOSIS — L578 Other skin changes due to chronic exposure to nonionizing radiation: Secondary | ICD-10-CM | POA: Diagnosis not present

## 2022-01-26 DIAGNOSIS — B20 Human immunodeficiency virus [HIV] disease: Secondary | ICD-10-CM | POA: Diagnosis not present

## 2022-01-26 DIAGNOSIS — A523 Neurosyphilis, unspecified: Secondary | ICD-10-CM | POA: Diagnosis not present

## 2022-01-26 DIAGNOSIS — L821 Other seborrheic keratosis: Secondary | ICD-10-CM | POA: Diagnosis not present

## 2022-01-26 DIAGNOSIS — C44519 Basal cell carcinoma of skin of other part of trunk: Secondary | ICD-10-CM | POA: Diagnosis not present

## 2022-01-26 DIAGNOSIS — L820 Inflamed seborrheic keratosis: Secondary | ICD-10-CM | POA: Diagnosis not present

## 2022-01-27 ENCOUNTER — Telehealth: Payer: Self-pay

## 2022-02-06 ENCOUNTER — Ambulatory Visit: Payer: BC Managed Care – PPO | Admitting: Nurse Practitioner

## 2022-02-06 DIAGNOSIS — A539 Syphilis, unspecified: Secondary | ICD-10-CM

## 2022-02-06 MED ORDER — PENICILLIN G BENZATHINE 1200000 UNIT/2ML IM SUSY
2.4000 10*6.[IU] | PREFILLED_SYRINGE | INTRAMUSCULAR | Status: AC
  Administered 2022-02-06 – 2022-02-20 (×3): 2.4 10*6.[IU] via INTRAMUSCULAR

## 2022-02-06 NOTE — Progress Notes (Signed)
[facility] STI clinic/screening visit  Subjective:  Christopher Hart. is a 61 y.o. male being seen today for an STI screening visit. The patient reports they do not have symptoms.    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/2021   Vertigo 04/19/2021   Mixed hyperlipidemia 12/24/2020   GERD without esophagitis 12/24/2020   Depression with anxiety 12/24/2020   CKD (chronic kidney disease), stage IIIa 12/24/2020   Brain mass 12/24/2020   Coronary artery disease involving native coronary artery of native heart with unstable angina pectoris ([place]) 12/24/2020   Facial droop 12/24/2020   Healthcare maintenance 10/21/2018   NSTEMI (non-ST elevated myocardial infarction) ([place]) 06/12/2017   Primary hypertension 04/01/2012   HIV disease ([place]) 04/01/2012   Allergic rhinitis, mild 04/01/2012   Obesity (BMI 35.0-39.9 without comorbidity) 04/01/2012     Chief Complaint  Patient presents with   SEXUALLY TRANSMITTED DISEASE    Treatment for "neurosyphilis"    HPI  Patient reports to clinic today to receive treatment for Syphilis.  Patient was referred by Dr. Adrian Prows due to an elevated titer result.  Per Dr. Ola Spurr patient to have 3 doses of Bicillin 2.4 million units IM.  Patient was previously treated for neurosyphilis.  Currently asymptomatic.    Does the patient or their partner desires a pregnancy in the next year? No  Screening for MPX risk: Does the patient have an unexplained rash? No Is  the patient MSM? No Does the patient endorse multiple sex partners or anonymous sex partners? No Did the patient have close or sexual contact with a person diagnosed with MPX? No Has the patient traveled outside the [place] where MPX is endemic? No Is there a high clinical suspicion for MPX-- evidenced by one of the following No  -Unlikely to be chickenpox  -Lymphadenopathy  -Rash that present in same phase of evolution on any given body part   See flowsheet for further details and programmatic requirements.   Immunization History  Administered Date(s) Administered   Hepatitis A 11/25/2001, 12/23/2001, 06/20/2002   Hepatitis B 11/25/2001, 12/23/2001, 06/20/2002   Influenza Split 06/24/2009   PPD Test 10/24/2001   Pneumococcal Conjugate-13 11/25/2001     The following portions of the patient's history were reviewed and updated as appropriate: allergies, current medications, past medical history, past social history, past surgical history and problem list.  Objective:  There were no vitals filed for this visit.  Physical Exam Constitutional:      Appearance: Normal appearance.  HENT:     Head: Normocephalic. No abrasion, masses or laceration. Hair is normal.     Mouth/Throat:     Mouth: No oral lesions.     Dentition: No dental caries.     Pharynx: No pharyngeal swelling, oropharyngeal exudate, posterior oropharyngeal erythema or uvula swelling.     Tonsils: No tonsillar exudate or tonsillar abscesses.  Eyes:     General: Lids are normal.        Right eye: No discharge.        Left eye:  No discharge.     Conjunctiva/sclera: Conjunctivae normal.     Right eye: No exudate.    Left eye: No exudate. Abdominal:     General: Abdomen is flat.     Palpations: Abdomen is soft.     Tenderness: There is no abdominal tenderness. There is no rebound.  Genitourinary:    Rectum: Normal.  Musculoskeletal:     Cervical back: Full passive range of motion without pain, normal range of motion  and neck supple.  Lymphadenopathy:     Cervical: No cervical adenopathy.     Right cervical: No superficial, deep or posterior cervical adenopathy.    Left cervical: No superficial, deep or posterior cervical adenopathy.     Upper Body:     Right upper body: No supraclavicular, axillary or epitrochlear adenopathy.     Left upper body: No supraclavicular, axillary or epitrochlear adenopathy.  Skin:    General: Skin is warm and dry.     Findings: No lesion or rash.  Neurological:     Mental Status: He is alert and oriented to person, place, and time.  Psychiatric:        Attention and Perception: Attention normal.        Mood and Affect: Mood normal.        Speech: Speech normal.        Behavior: Behavior normal. Behavior is cooperative.       Assessment and Plan:  Christopher Hart. is a 61 y.o. male presenting to the [facility] Department for STI screening  1. Syphilis -62 year old male in clinic today for treatment of Syphilis. -Patient declines all STD screening today and blood work. -Please give Bicillin 2.4 million units IM today.  Patient to return 7/10 and 7/17.  Patient advised not to have sex for 14 days and encouraged to use condoms with all sex.    - penicillin g benzathine (BICILLIN LA) 1200000 UNIT/2ML injection 2.4 Million Units     Return if symptoms worsen or fail to improve.  Future Appointments  Date Time Provider [place]  02/13/2022  8:20 AM [facility] NURSE [facility] None  02/20/2022  8:20 AM AC-STI NURSE AC-STI None  03/27/2022 11:20 AM Gollan, Kathlene November, MD [id number]    Gregary Cromer, FNP

## 2022-02-06 NOTE — Progress Notes (Signed)
Patient here for syphilis tx. Patient instructed to make next two follow up appts for tx. Patient instructed to wait 20 minutes post injection to monitor him for S/S.

## 2022-02-09 NOTE — Telephone Encounter (Addendum)
Bicillin x 3 Treatments:  1st set = completed 02/06/22 2nd set =  (scheduled for 7/10) - completed 02/13/22 3rd set=  (scheduled for 7/17) - completed 02/20/22

## 2022-02-13 ENCOUNTER — Ambulatory Visit: Payer: Self-pay

## 2022-02-13 DIAGNOSIS — A539 Syphilis, unspecified: Secondary | ICD-10-CM

## 2022-02-13 NOTE — Progress Notes (Signed)
In nurse clinic today for syphilis treatment.Referred by  Adrian Prows , MD to [facility] for treatment of neurosyphilis. Seen by Gregary Cromer, NP on 7/3 initiated Bicilin 2.10m  1 time a week for 3 weeks. Today came in for 2nd dose . Per pt no adverse reactions to 1st dose given . Advised to return for 3rd dose on 7/17. Counseled about no sex for 14days from 7/3 and any partners needed to be evaluated and treated. Tolerated medication injection well. Advised to call w/ any questions or concerns.

## 2022-02-20 ENCOUNTER — Ambulatory Visit: Payer: BC Managed Care – PPO

## 2022-02-20 DIAGNOSIS — R479 Unspecified speech disturbances: Secondary | ICD-10-CM | POA: Diagnosis not present

## 2022-02-20 DIAGNOSIS — A523 Neurosyphilis, unspecified: Secondary | ICD-10-CM | POA: Diagnosis not present

## 2022-02-20 DIAGNOSIS — A539 Syphilis, unspecified: Secondary | ICD-10-CM

## 2022-02-20 DIAGNOSIS — G510 Bell's palsy: Secondary | ICD-10-CM | POA: Diagnosis not present

## 2022-02-20 DIAGNOSIS — R42 Dizziness and giddiness: Secondary | ICD-10-CM | POA: Diagnosis not present

## 2022-02-20 NOTE — Progress Notes (Signed)
In Nurse Clinic for syphilis tx / Bicillin #3 (final dose).  Received bicillin on 02/06/22 and 02/13/22.  Denies prob with previous injections. Declines to wait for 20 min observation. States he has appt with [facility] clinic this morning.  Advised to return for RPR in 6 mo (approx 08/2022 and reminder given.)  Bicillin given today per order by Collene Leyden, FNP dated 02/06/2022. Pt referred by Dr Adrian Prows. Tolerated injections well. Josie Saunders, RN

## 2022-02-21 DIAGNOSIS — R42 Dizziness and giddiness: Secondary | ICD-10-CM | POA: Diagnosis not present

## 2022-02-21 DIAGNOSIS — I10 Essential (primary) hypertension: Secondary | ICD-10-CM | POA: Diagnosis not present

## 2022-02-21 DIAGNOSIS — I2510 Atherosclerotic heart disease of native coronary artery without angina pectoris: Secondary | ICD-10-CM | POA: Diagnosis not present

## 2022-02-21 DIAGNOSIS — A523 Neurosyphilis, unspecified: Secondary | ICD-10-CM | POA: Diagnosis not present

## 2022-03-10 DIAGNOSIS — F419 Anxiety disorder, unspecified: Secondary | ICD-10-CM | POA: Diagnosis not present

## 2022-03-10 DIAGNOSIS — F331 Major depressive disorder, recurrent, moderate: Secondary | ICD-10-CM | POA: Diagnosis not present

## 2022-03-26 NOTE — Progress Notes (Unsigned)
Cardiology Office Note  Date:  03/27/2022   ID:  [name] DOB 01/02/1961, MRN [id number]  PCP:  Gladstone Lighter, MD   Chief Complaint  Patient presents with   6 month follow up     Patient c/o chest pain on January 22, 2022; was relieved by x 2 NTG tablets, if he's lying on the left side, he has some pressure in chest with feeling irregular heart beats. Medications reviewed by the patient verbally.     HPI:  Christopher Hart. is a 61 y.o. male with history of  CAD with NSTEMI in 06/2017  NSTEMI in 05/2021 status post PCI to the LAD,  HIV, neurosyphilis,  HTN,  HLD,  Bell's palsy in 12/2020,  vertigo, subpendymoma mass stable since 2018,  peripheral neuropathy, depression, anxiety, and obesity who presents for follow up of CAD, chest pain.  Last seen by myself in clinic February 2023  June 18th  2023, episode of chest pain, presented at rest while trying to go to sleep Took NTG x2, went back to sleep Has not had any further episodes since that time Did not feel like GERD symptoms Some associated palpitations, tachycardia  Some left side pressure laying on left side  There is concern for sleep disorder Physicians in [facility] trying to set up a sleep study in [place]  On PPI, inhaler for GERD  [place] last week, uses a cane No trauma  No regular exercise program Periodically walks his dog  EKG personally reviewed by myself on todays visit Nsr rate 89 bpm no ST or T eave changes  In the hospital from 10/3 through 05/12/21 with an NSTEMI.   Echo showed an EF of 65 to 70%, no regional wall motion abnormalities, mild LVH, grade 1 diastolic dysfunction, normal RV systolic function and ventricular cavity size, normal PASP, trivial mitral regurgitation, estimated right atrial pressure 3 mmHg.     LHC on 05/11/2021 which demonstrated significant three-vessel CAD including sequential 50% proximal, 80% mid, 40% mid, and 70% distal LAD stenoses, diffusely diseased  small distal LCx and OM2 with up to 90% stenosis, and sequential 50% ostial/proximal RCA, 40 to 50% distal RCA, and 90% distal RPA V lesions.  successful IFR and IVUS guided PCI to the mid LAD.   A small D2 branch was jailed by the stent with with reduction in flow from TIMI-2 to TIMI-0 and collateralization via left to left collaterals.    Other past medical history reviewed hospital in 06/2017 with an NSTEMI.    Echo showed an EF of 60 to 65%, no regional wall motion abnormalities, normal LV diastolic function parameters, mild mitral regurgitation, mildly dilated left atrium, normal RV systolic function and PASP.    LHC showed a large LAD with 30 to 40% proximal/mid/distal disease, occluded mid/distal LCx, 30% proximal RCA stenosis, and 40% distal RCA stenosis.   lost to follow-up.    hospital in 12/2020 with Bell's palsy and found to have a subpendymoma mass that was stable since 2018, which is followed by neurology.   MRI was negative for acute CVA.   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  PSH:    Past Surgical History:  Procedure Laterality Date  Cataract surgery Bilateral    CORONARY STENT INTERVENTION N/A 05/11/2021   Procedure: CORONARY STENT INTERVENTION;  Surgeon: Nelva Bush, MD;  Location: [facility];  Service: Cardiovascular;  Laterality: N/A;   INTRAVASCULAR PRESSURE WIRE/FFR STUDY N/A 05/11/2021   Procedure: INTRAVASCULAR PRESSURE WIRE/FFR STUDY;  Surgeon: Nelva Bush, MD;  Location: [facility];  Service: Cardiovascular;  Laterality: N/A;   INTRAVASCULAR  ULTRASOUND/IVUS N/A 05/11/2021   Procedure: Intravascular Ultrasound/IVUS;  Surgeon: Nelva Bush, MD;  Location: [facility];  Service: Cardiovascular;  Laterality: N/A;   LEFT HEART CATH AND CORONARY ANGIOGRAPHY N/A 06/13/2017   Procedure: LEFT HEART CATH AND CORONARY ANGIOGRAPHY;  Surgeon: Minna Merritts, MD;  Location: [facility];  Service: Cardiovascular;  Laterality: N/A;   LEFT HEART CATH AND CORONARY ANGIOGRAPHY N/A 05/11/2021   Procedure: LEFT HEART CATH AND CORONARY ANGIOGRAPHY;  Surgeon: Nelva Bush, MD;  Location: [facility];  Service: Cardiovascular;  Laterality: N/A;    Current Outpatient Medications  Medication Sig Dispense Refill   albuterol (VENTOLIN HFA) 108 (90 Base) MCG/ACT inhaler Inhale into the lungs.     ALPRAZolam (XANAX) 0.5 MG tablet 1 tablet     amLODipine (NORVASC) 5 MG tablet 1 tablet     aspirin 81 MG chewable tablet Chew 1 tablet (81 mg total) daily by mouth. 30 tablet 0   atorvastatin (LIPITOR) 80 MG tablet Take 1 tablet (80 mg total) by mouth daily. 90 tablet 3   efavirenz-emtricitabine-tenofovir (ATRIPLA) 600-200-300 MG tablet Take 1 tablet by mouth at bedtime.     ezetimibe (ZETIA) 10 MG tablet 1 tablet     gabapentin (NEURONTIN) 300 MG capsule Take 1 capsule by mouth in the morning and at bedtime.     gabapentin (NEURONTIN) 300 MG capsule Take by mouth.     isosorbide mononitrate (IMDUR) 30 MG 24 hr tablet Take 1 tablet (30 mg total) by mouth daily. 90 tablet 3   ketorolac (ACULAR) 0.5 % ophthalmic solution Place 1 drop into both eyes 4 (four) times daily. 5 mL 5   lisinopril-hydrochlorothiazide (ZESTORETIC) 20-12.5 MG tablet 1 tablet     LORazepam (ATIVAN) 1 MG tablet Take 1 tablet by mouth every 8 (eight) hours as needed.     meclizine (ANTIVERT) 25 MG tablet Take 1 tablet (25 mg total) by mouth 3 (three) times daily as needed for dizziness or nausea. 30 tablet 1   metoprolol succinate (TOPROL XL) 25 MG 24 hr tablet  Take 1 tablet (25 mg total) by mouth daily. 90 tablet 3   nitroGLYCERIN (NITROSTAT) 0.4 MG SL tablet Place 1 tablet (0.4 mg total) under the tongue every 5 (five) minutes as needed for chest pain. 14 tablet 12   nortriptyline (PAMELOR) 10 MG capsule Take 10 mg by mouth 3 (three) times daily.     ondansetron (ZOFRAN-ODT) 4 MG disintegrating tablet Take 4 mg by mouth every 8 (eight) hours as needed for nausea or vomiting.     prasugrel (EFFIENT) 10 MG TABS tablet Take 1 tablet (10 mg total) by mouth daily. 30 tablet 11   sertraline (ZOLOFT) 25 MG tablet Take 1 tablet by mouth in the morning and at bedtime.     No current facility-administered medications for this visit.    Allergies:   Shellfish allergy and Contrast media [iodinated contrast media]   Social History:  The patient  reports that he has never smoked. He has never used smokeless tobacco. He reports that he does  not currently use alcohol. He reports that he does not use drugs.   Family History:   family history includes CAD in his maternal grandmother, mother, and paternal grandmother.    Review of Systems: Review of Systems  Constitutional: Negative.   HENT: Negative.    Respiratory: Negative.    Cardiovascular: Negative.   Gastrointestinal: Negative.   Musculoskeletal: Negative.   Neurological: Negative.   Psychiatric/Behavioral: Negative.    All other systems reviewed and are negative.   PHYSICAL EXAM: VS:  BP 138/84 (BP Location: Left Arm, Patient Position: Sitting, Cuff Size: Large)   Pulse 95   Ht '5\' 10"'$  (1.778 m)   Wt 248 lb 4 oz (112.6 kg)   SpO2 98%   BMI 35.62 kg/m  , BMI Body mass index is 35.62 kg/m. Constitutional:  oriented to person, place, and time. No distress.  HENT:  Head: Grossly normal Eyes:  no discharge. No scleral icterus.  Neck: No JVD, no carotid bruits  Cardiovascular: Regular rate and rhythm, no murmurs appreciated Pulmonary/Chest: Clear to auscultation bilaterally, no wheezes or  rails Abdominal: Soft.  no distension.  no tenderness.  Musculoskeletal: Normal range of motion Neurological:  normal muscle tone. Coordination normal. No atrophy Skin: Skin warm and dry Psychiatric: normal affect, pleasant  Recent Labs: 05/09/2021: Magnesium 1.9 05/12/2021: BUN 21; Creatinine, Ser 1.08; Hemoglobin 15.4; Platelets 391; Potassium 3.6; Sodium 135    Lipid Panel Lab Results  Component Value Date   CHOL 123 05/10/2021   HDL 28 (L) 05/10/2021   LDLCALC 70 05/10/2021   TRIG 123 05/10/2021      Wt Readings from Last 3 Encounters:  03/27/22 248 lb 4 oz (112.6 kg)  01/19/22 232 lb (105.2 kg)  09/23/21 231 lb 6 oz (105 kg)     ASSESSMENT AND PLAN:  Problem List Items Addressed This Visit       Cardiology Problems   Coronary artery disease involving native coronary artery of native heart with unstable angina pectoris (HCC) - Primary   Relevant Medications   amLODipine (NORVASC) 5 MG tablet   ezetimibe (ZETIA) 10 MG tablet   lisinopril-hydrochlorothiazide (ZESTORETIC) 20-12.5 MG tablet   Other Visit Diagnoses     Essential hypertension       Relevant Medications   amLODipine (NORVASC) 5 MG tablet   ezetimibe (ZETIA) 10 MG tablet   lisinopril-hydrochlorothiazide (ZESTORETIC) 20-12.5 MG tablet   Hyperlipidemia LDL goal <70       Relevant Medications   amLODipine (NORVASC) 5 MG tablet   ezetimibe (ZETIA) 10 MG tablet   lisinopril-hydrochlorothiazide (ZESTORETIC) 20-12.5 MG tablet   Sleep-disordered breathing       Dizziness          Coronary disease with stable angina Episode of chest pain June 2023 resolved with nitro x2, no further episodes since that time We have recommended for recurrent episodes he contact our office, pharmacologic Myoview may be needed We will increase the isosorbide up to 30 mg twice a day as tolerated Cholesterol at goal, non-smoker, sugar numbers well controlled  Essential hypertension Recommend increase isosorbide up to 30  twice daily  Hyperlipidemia Cholesterol is at goal on the current lipid regimen. No changes to the medications were made.    Total encounter time more than 30 minutes  Greater than 50% was spent in counseling and coordination of care with the patient    Signed, Esmond Plants, M.D., Ph.D. [place], [place]

## 2022-03-27 ENCOUNTER — Ambulatory Visit (INDEPENDENT_AMBULATORY_CARE_PROVIDER_SITE_OTHER): Payer: BC Managed Care – PPO | Admitting: Cardiovascular Disease

## 2022-03-27 ENCOUNTER — Encounter: Payer: Self-pay | Admitting: Cardiovascular Disease

## 2022-03-27 VITALS — BP 138/84 | HR 95 | Ht 70.0 in | Wt 248.2 lb

## 2022-03-27 DIAGNOSIS — R42 Dizziness and giddiness: Secondary | ICD-10-CM

## 2022-03-27 DIAGNOSIS — I10 Essential (primary) hypertension: Secondary | ICD-10-CM

## 2022-03-27 DIAGNOSIS — E785 Hyperlipidemia, unspecified: Secondary | ICD-10-CM | POA: Diagnosis not present

## 2022-03-27 DIAGNOSIS — G4730 Sleep apnea, unspecified: Secondary | ICD-10-CM | POA: Diagnosis not present

## 2022-03-27 DIAGNOSIS — I25110 Atherosclerotic heart disease of native coronary artery with unstable angina pectoris: Secondary | ICD-10-CM

## 2022-03-27 MED ORDER — NITROGLYCERIN 0.4 MG SL SUBL: 0.4000 mg | SUBLINGUAL_TABLET | SUBLINGUAL | 3 refills | Status: DC | PRN

## 2022-03-27 MED ORDER — ISOSORBIDE MONONITRATE ER 30 MG PO TB24: ORAL_TABLET | ORAL | 3 refills | Status: AC

## 2022-03-27 NOTE — Patient Instructions (Addendum)
Christopher Hart, sleep doc in [place] Phone: [phone]  Research Watch-PAT for sleep study (home study)   Medication Instructions:  - Your physician has recommended you make the following change in your medication:   1) INCREASE imdur (isosorbide) 30 mg: - take 1 tablet by mouth twice a day  If you need a refill on your cardiac medications before your next appointment, please call your pharmacy.    Lab work: No new labs needed   Testing/Procedures: No new testing needed   Follow-Up: At [facility], you and your health needs are our priority.  As part of our continuing mission to provide you with exceptional heart care, we have created designated Provider Care Teams.  These Care Teams include your primary Cardiologist (physician) and Advanced Practice Providers (APPs -  Physician Assistants and Nurse Practitioners) who all work together to provide you with the care you need, when you need it.  You will need a follow up appointment in 12 months  Providers on your designated Care Team:   Murray Hodgkins, NP Christell Faith, PA-C Cadence Kathlen Mody, [place]  COVID-19 Vaccine Information can be found at: [email or web address] For questions related to vaccine distribution or appointments, please email [email or web address] or call [phone].

## 2022-04-04 DIAGNOSIS — F419 Anxiety disorder, unspecified: Secondary | ICD-10-CM | POA: Diagnosis not present

## 2022-04-04 DIAGNOSIS — F331 Major depressive disorder, recurrent, moderate: Secondary | ICD-10-CM | POA: Diagnosis not present

## 2022-04-05 DIAGNOSIS — H814 Vertigo of central origin: Secondary | ICD-10-CM | POA: Diagnosis not present

## 2022-04-05 DIAGNOSIS — R2681 Unsteadiness on feet: Secondary | ICD-10-CM | POA: Diagnosis not present

## 2022-04-05 DIAGNOSIS — R531 Weakness: Secondary | ICD-10-CM | POA: Diagnosis not present

## 2022-04-05 DIAGNOSIS — R262 Difficulty in walking, not elsewhere classified: Secondary | ICD-10-CM | POA: Diagnosis not present

## 2022-04-12 DIAGNOSIS — H814 Vertigo of central origin: Secondary | ICD-10-CM | POA: Diagnosis not present

## 2022-04-12 DIAGNOSIS — R2681 Unsteadiness on feet: Secondary | ICD-10-CM | POA: Diagnosis not present

## 2022-04-12 DIAGNOSIS — R531 Weakness: Secondary | ICD-10-CM | POA: Diagnosis not present

## 2022-04-12 DIAGNOSIS — R262 Difficulty in walking, not elsewhere classified: Secondary | ICD-10-CM | POA: Diagnosis not present

## 2022-04-17 DIAGNOSIS — R262 Difficulty in walking, not elsewhere classified: Secondary | ICD-10-CM | POA: Diagnosis not present

## 2022-04-17 DIAGNOSIS — R2681 Unsteadiness on feet: Secondary | ICD-10-CM | POA: Diagnosis not present

## 2022-04-17 DIAGNOSIS — H814 Vertigo of central origin: Secondary | ICD-10-CM | POA: Diagnosis not present

## 2022-04-17 DIAGNOSIS — R531 Weakness: Secondary | ICD-10-CM | POA: Diagnosis not present

## 2022-04-19 DIAGNOSIS — F331 Major depressive disorder, recurrent, moderate: Secondary | ICD-10-CM | POA: Diagnosis not present

## 2022-04-19 DIAGNOSIS — F419 Anxiety disorder, unspecified: Secondary | ICD-10-CM | POA: Diagnosis not present

## 2022-04-27 DIAGNOSIS — R2681 Unsteadiness on feet: Secondary | ICD-10-CM | POA: Diagnosis not present

## 2022-04-27 DIAGNOSIS — H814 Vertigo of central origin: Secondary | ICD-10-CM | POA: Diagnosis not present

## 2022-04-27 DIAGNOSIS — R262 Difficulty in walking, not elsewhere classified: Secondary | ICD-10-CM | POA: Diagnosis not present

## 2022-04-27 DIAGNOSIS — R531 Weakness: Secondary | ICD-10-CM | POA: Diagnosis not present

## 2022-05-02 DIAGNOSIS — R531 Weakness: Secondary | ICD-10-CM | POA: Diagnosis not present

## 2022-05-02 DIAGNOSIS — R2681 Unsteadiness on feet: Secondary | ICD-10-CM | POA: Diagnosis not present

## 2022-05-02 DIAGNOSIS — H814 Vertigo of central origin: Secondary | ICD-10-CM | POA: Diagnosis not present

## 2022-05-02 DIAGNOSIS — R262 Difficulty in walking, not elsewhere classified: Secondary | ICD-10-CM | POA: Diagnosis not present

## 2022-05-03 ENCOUNTER — Other Ambulatory Visit: Payer: Self-pay | Admitting: Cardiovascular Disease

## 2022-05-03 DIAGNOSIS — F331 Major depressive disorder, recurrent, moderate: Secondary | ICD-10-CM | POA: Diagnosis not present

## 2022-05-03 DIAGNOSIS — F419 Anxiety disorder, unspecified: Secondary | ICD-10-CM | POA: Diagnosis not present

## 2022-05-05 DIAGNOSIS — H814 Vertigo of central origin: Secondary | ICD-10-CM | POA: Diagnosis not present

## 2022-05-05 DIAGNOSIS — R531 Weakness: Secondary | ICD-10-CM | POA: Diagnosis not present

## 2022-05-05 DIAGNOSIS — R262 Difficulty in walking, not elsewhere classified: Secondary | ICD-10-CM | POA: Diagnosis not present

## 2022-05-05 DIAGNOSIS — R2681 Unsteadiness on feet: Secondary | ICD-10-CM | POA: Diagnosis not present

## 2022-05-11 DIAGNOSIS — F419 Anxiety disorder, unspecified: Secondary | ICD-10-CM | POA: Diagnosis not present

## 2022-05-11 DIAGNOSIS — F331 Major depressive disorder, recurrent, moderate: Secondary | ICD-10-CM | POA: Diagnosis not present

## 2022-05-16 DIAGNOSIS — R2681 Unsteadiness on feet: Secondary | ICD-10-CM | POA: Diagnosis not present

## 2022-05-16 DIAGNOSIS — H814 Vertigo of central origin: Secondary | ICD-10-CM | POA: Diagnosis not present

## 2022-05-16 DIAGNOSIS — R262 Difficulty in walking, not elsewhere classified: Secondary | ICD-10-CM | POA: Diagnosis not present

## 2022-05-16 DIAGNOSIS — R531 Weakness: Secondary | ICD-10-CM | POA: Diagnosis not present

## 2022-05-17 ENCOUNTER — Telehealth: Payer: Self-pay | Admitting: Cardiovascular Disease

## 2022-05-17 DIAGNOSIS — G4730 Sleep apnea, unspecified: Secondary | ICD-10-CM

## 2022-05-17 NOTE — Telephone Encounter (Signed)
Patient is following up to schedule sleep study, per Dr. Rockey Situ, but order has not been placed.

## 2022-05-17 NOTE — Telephone Encounter (Signed)
Dr. Rockey Situ- please advise.  This was on his AVS from his August visit with you.  Golden Hurter, sleep doc in [place] Phone: [phone]   Research Watch-PAT for sleep study (home study)    Are you just wanting him to do the home sleep study?

## 2022-05-18 DIAGNOSIS — R531 Weakness: Secondary | ICD-10-CM | POA: Diagnosis not present

## 2022-05-18 DIAGNOSIS — R2681 Unsteadiness on feet: Secondary | ICD-10-CM | POA: Diagnosis not present

## 2022-05-18 DIAGNOSIS — R262 Difficulty in walking, not elsewhere classified: Secondary | ICD-10-CM | POA: Diagnosis not present

## 2022-05-18 DIAGNOSIS — H814 Vertigo of central origin: Secondary | ICD-10-CM | POA: Diagnosis not present

## 2022-05-19 NOTE — Telephone Encounter (Signed)
Minna Merritts, MD  Sent: Fri May 19, 2022  3:25 PM  To: Emily Filbert, RN          Message  On his last clinic visit with me his outside physicians were going to set up a sleep study for him  If that has fallen through we can order the [facility]  And arrange follow-up via video visit with Fransico Him to go over the results  Thx  TGollan

## 2022-05-22 DIAGNOSIS — F331 Major depressive disorder, recurrent, moderate: Secondary | ICD-10-CM | POA: Diagnosis not present

## 2022-05-22 DIAGNOSIS — F419 Anxiety disorder, unspecified: Secondary | ICD-10-CM | POA: Diagnosis not present

## 2022-05-22 NOTE — Telephone Encounter (Signed)
Left voicemail message to call back  

## 2022-05-23 DIAGNOSIS — H814 Vertigo of central origin: Secondary | ICD-10-CM | POA: Diagnosis not present

## 2022-05-23 DIAGNOSIS — R2681 Unsteadiness on feet: Secondary | ICD-10-CM | POA: Diagnosis not present

## 2022-05-23 DIAGNOSIS — R531 Weakness: Secondary | ICD-10-CM | POA: Diagnosis not present

## 2022-05-23 DIAGNOSIS — R262 Difficulty in walking, not elsewhere classified: Secondary | ICD-10-CM | POA: Diagnosis not present

## 2022-05-23 NOTE — Telephone Encounter (Signed)
I called and spoke with the patient. I advised him of Dr. Donivan Scull response as stated below.  Per the patient, he had discussed with his physicians in [place] and he thought he was supposed to hear something from [place], but he has never heard anything.  He is interested in the [facility] home sleep study.  I advised the patient I would need to: 1) obtain a STOP BANG score from him 2) order the test 3) send through precert  He is aware if the test, is authorized we will call him back to pick up the home sleep test in our office and then preform the test at home.   The patient voices understanding and is agreeable.  Sleep Apnea Evaluation  [facility]  Today's Date: 05/23/2022   Patient Name: Christopher Hart.        DOB: 14-Dec-1960       Height:     5'10"   Weight:   255 lbs as of today BMI: 36.5    Referring Provider:  Rockey Situ   STOP-BANG RISK ASSESSMENT        8  If STOP-BANG Score ?3 OR two clinical symptoms - patient qualifies for WatchPAT (CPT 95800)      Sleep study ordered due to two (2) of the following clinical symptoms/diagnoses:  Excessive daytime sleepiness G47.10  Gastroesophageal reflux K21.9  Nocturia R35.1  Morning Headaches G44.221  Difficulty concentrating R41.840  Memory problems or poor judgment G31.84  Personality changes or irritability R45.4  Loud snoring R06.83  Depression F32.9  Unrefreshed by sleep G47.8  Impotence N52.9  History of high blood pressure R03.0  Insomnia G47.00  Sleep Disordered Breathing or Sleep Apnea ICD G47.33

## 2022-05-23 NOTE — Telephone Encounter (Signed)
WatchPat order placed.  Staff message sent to Sleep Studies pool.  Awaiting a response back if the patient qualifies for home sleep study.

## 2022-05-26 DIAGNOSIS — B20 Human immunodeficiency virus [HIV] disease: Secondary | ICD-10-CM | POA: Diagnosis not present

## 2022-05-26 DIAGNOSIS — A523 Neurosyphilis, unspecified: Secondary | ICD-10-CM | POA: Diagnosis not present

## 2022-05-29 ENCOUNTER — Telehealth: Payer: Self-pay | Admitting: *Deleted

## 2022-05-29 NOTE — Telephone Encounter (Signed)
Prior Authorization for [employer] sent to [employer]. Per Nevin Bloodgood T no PA is required for CPT code 95800. Call Reference # [id number].

## 2022-05-30 DIAGNOSIS — R2681 Unsteadiness on feet: Secondary | ICD-10-CM | POA: Diagnosis not present

## 2022-05-30 DIAGNOSIS — H814 Vertigo of central origin: Secondary | ICD-10-CM | POA: Diagnosis not present

## 2022-05-30 DIAGNOSIS — R262 Difficulty in walking, not elsewhere classified: Secondary | ICD-10-CM | POA: Diagnosis not present

## 2022-05-30 DIAGNOSIS — R531 Weakness: Secondary | ICD-10-CM | POA: Diagnosis not present

## 2022-05-31 NOTE — Telephone Encounter (Signed)
Reviewed the patient's chart.  Per a separate phone encounter:  May 29, 2022 Lauralee Even[facility]   [facility]   05/29/22  9:59 AM Note Prior Authorization for [employer] sent to [employer] via [employer]. Per Nevin Bloodgood T no PA is required for CPT code 95800. Call Reference # [id number].       I attempted to call the patient to let him know we could now have him come pick up his home sleep test. He will need to sign an authorization as well.  No answer- I left a message to please call back.  No DPR on file for a detailed message to be left.

## 2022-06-02 NOTE — Telephone Encounter (Signed)
I spoke with the patient and advised him that he is ok to proceed with his home sleep study.  He is aware I will place the home sleep test Radiographer, therapeutic) at our front desk with instructions on how to use this and an authorization for use that he will need to sign.  The patient voices understanding and is agreeable.   Watch Pat One Serial # [id number]  Registered in [place] at front desk with Patient responsibility form

## 2022-06-06 DIAGNOSIS — R2681 Unsteadiness on feet: Secondary | ICD-10-CM | POA: Diagnosis not present

## 2022-06-06 DIAGNOSIS — R531 Weakness: Secondary | ICD-10-CM | POA: Diagnosis not present

## 2022-06-06 DIAGNOSIS — R262 Difficulty in walking, not elsewhere classified: Secondary | ICD-10-CM | POA: Diagnosis not present

## 2022-06-06 DIAGNOSIS — H814 Vertigo of central origin: Secondary | ICD-10-CM | POA: Diagnosis not present

## 2022-06-07 DIAGNOSIS — F419 Anxiety disorder, unspecified: Secondary | ICD-10-CM | POA: Diagnosis not present

## 2022-06-07 DIAGNOSIS — F331 Major depressive disorder, recurrent, moderate: Secondary | ICD-10-CM | POA: Diagnosis not present

## 2022-06-08 DIAGNOSIS — R262 Difficulty in walking, not elsewhere classified: Secondary | ICD-10-CM | POA: Diagnosis not present

## 2022-06-08 DIAGNOSIS — R2681 Unsteadiness on feet: Secondary | ICD-10-CM | POA: Diagnosis not present

## 2022-06-08 DIAGNOSIS — H814 Vertigo of central origin: Secondary | ICD-10-CM | POA: Diagnosis not present

## 2022-06-08 DIAGNOSIS — R531 Weakness: Secondary | ICD-10-CM | POA: Diagnosis not present

## 2022-06-13 DIAGNOSIS — R42 Dizziness and giddiness: Secondary | ICD-10-CM | POA: Diagnosis not present

## 2022-06-13 DIAGNOSIS — R531 Weakness: Secondary | ICD-10-CM | POA: Diagnosis not present

## 2022-06-13 DIAGNOSIS — R2681 Unsteadiness on feet: Secondary | ICD-10-CM | POA: Diagnosis not present

## 2022-06-15 DIAGNOSIS — R531 Weakness: Secondary | ICD-10-CM | POA: Diagnosis not present

## 2022-06-15 DIAGNOSIS — R42 Dizziness and giddiness: Secondary | ICD-10-CM | POA: Diagnosis not present

## 2022-06-15 DIAGNOSIS — R2681 Unsteadiness on feet: Secondary | ICD-10-CM | POA: Diagnosis not present

## 2022-06-20 DIAGNOSIS — F331 Major depressive disorder, recurrent, moderate: Secondary | ICD-10-CM | POA: Diagnosis not present

## 2022-06-20 DIAGNOSIS — F419 Anxiety disorder, unspecified: Secondary | ICD-10-CM | POA: Diagnosis not present

## 2022-06-30 DIAGNOSIS — R2681 Unsteadiness on feet: Secondary | ICD-10-CM | POA: Diagnosis not present

## 2022-06-30 DIAGNOSIS — R42 Dizziness and giddiness: Secondary | ICD-10-CM | POA: Diagnosis not present

## 2022-06-30 DIAGNOSIS — R531 Weakness: Secondary | ICD-10-CM | POA: Diagnosis not present

## 2022-07-06 DIAGNOSIS — E119 Type 2 diabetes mellitus without complications: Secondary | ICD-10-CM | POA: Diagnosis not present

## 2022-07-07 DIAGNOSIS — F331 Major depressive disorder, recurrent, moderate: Secondary | ICD-10-CM | POA: Diagnosis not present

## 2022-07-07 DIAGNOSIS — F419 Anxiety disorder, unspecified: Secondary | ICD-10-CM | POA: Diagnosis not present

## 2022-07-12 DIAGNOSIS — A523 Neurosyphilis, unspecified: Secondary | ICD-10-CM | POA: Diagnosis not present

## 2022-07-12 DIAGNOSIS — B20 Human immunodeficiency virus [HIV] disease: Secondary | ICD-10-CM | POA: Diagnosis not present

## 2022-07-12 DIAGNOSIS — I2510 Atherosclerotic heart disease of native coronary artery without angina pectoris: Secondary | ICD-10-CM | POA: Diagnosis not present

## 2022-07-12 DIAGNOSIS — Z125 Encounter for screening for malignant neoplasm of prostate: Secondary | ICD-10-CM | POA: Diagnosis not present

## 2022-07-12 DIAGNOSIS — I10 Essential (primary) hypertension: Secondary | ICD-10-CM | POA: Diagnosis not present

## 2022-07-18 DIAGNOSIS — B20 Human immunodeficiency virus [HIV] disease: Secondary | ICD-10-CM | POA: Diagnosis not present

## 2022-07-18 DIAGNOSIS — A523 Neurosyphilis, unspecified: Secondary | ICD-10-CM | POA: Diagnosis not present

## 2022-07-19 ENCOUNTER — Encounter (HOSPITAL_BASED_OUTPATIENT_CLINIC_OR_DEPARTMENT_OTHER): Payer: BC Managed Care – PPO | Admitting: Cardiology

## 2022-07-19 DIAGNOSIS — Z0000 Encounter for general adult medical examination without abnormal findings: Secondary | ICD-10-CM | POA: Diagnosis not present

## 2022-07-19 DIAGNOSIS — G4733 Obstructive sleep apnea (adult) (pediatric): Secondary | ICD-10-CM

## 2022-07-19 DIAGNOSIS — R058 Other specified cough: Secondary | ICD-10-CM | POA: Diagnosis not present

## 2022-07-19 DIAGNOSIS — F411 Generalized anxiety disorder: Secondary | ICD-10-CM | POA: Diagnosis not present

## 2022-07-19 DIAGNOSIS — A523 Neurosyphilis, unspecified: Secondary | ICD-10-CM | POA: Diagnosis not present

## 2022-07-19 DIAGNOSIS — R059 Cough, unspecified: Secondary | ICD-10-CM | POA: Diagnosis not present

## 2022-07-19 DIAGNOSIS — I2510 Atherosclerotic heart disease of native coronary artery without angina pectoris: Secondary | ICD-10-CM | POA: Diagnosis not present

## 2022-07-19 DIAGNOSIS — E782 Mixed hyperlipidemia: Secondary | ICD-10-CM | POA: Diagnosis not present

## 2022-07-20 DIAGNOSIS — F419 Anxiety disorder, unspecified: Secondary | ICD-10-CM | POA: Diagnosis not present

## 2022-07-20 DIAGNOSIS — F3342 Major depressive disorder, recurrent, in full remission: Secondary | ICD-10-CM | POA: Diagnosis not present

## 2022-07-24 NOTE — Procedures (Signed)
SLEEP STUDY REPORT Patient Information Study Date: 07/19/2022 Patient Name: Christopher Hart Patient ID: [id number] Birth Date: July 03, 1961 Age: 61 Gender: Male BMI: 35.7 (W=249 lb, H=5' 10'') Referring Physician: Ida Rogue, MD  TEST DESCRIPTION:  Home sleep apnea testing was completed using the WatchPat, a Type 1 device, utilizing peripheral arterial tonometry (PAT), chest movement, actigraphy, pulse oximetry, pulse rate, body position and snore.  AHI was calculated with apnea and hypopnea using valid sleep time as the denominator. RDI includes apneas, hypopneas, and RERAs.  The data acquired and the scoring of sleep and all associated events were performed in accordance with the recommended standards and specifications as outlined in the AASM Manual for the Scoring of Sleep and Associated Events 2.2.0 (2015).  FINDINGS:  1.  Moderate Obstructive Sleep Apnea with AHI 17.2/hr.   2.  No Central Sleep Apnea with pAHIc 4.9/hr.  3.  Oxygen desaturations as low as 88%.  4.  Severe snoring was present. O2 sats were < 88% for 0.1 min.  5.  Total sleep time was 7 hrs and 10 min.  6.  17.5% of total sleep time was spent in REM sleep.   7.  Normal sleep onset latency at 17 min  8.  Normal REM sleep onset latency at 81 min.   9.  Total awakenings were 24.  10. Arrhythmia detection:  Suggestive of possible brief atrial fibrillation lasting 1 min and 24 seconds.  This is not diagnostic and further testing with outpatient telemetry monitoring is recommended.  DIAGNOSIS:   Moderate Obstructive Sleep Apnea (G47.33) Possible Atrial Fibrillation  RECOMMENDATIONS:   1.  Clinical correlation of these findings is necessary.  The decision to treat obstructive sleep apnea (OSA) is usually based on the presence of apnea symptoms or the presence of associated medical conditions such as Hypertension, Congestive Heart Failure, Atrial Fibrillation or Obesity.  The most common symptoms of OSA are snoring,  gasping for breath while sleeping, daytime sleepiness and fatigue.   2.  Initiating apnea therapy is recommended given the presence of symptoms and/or associated conditions. Recommend proceeding with one of the following:     a.  Auto-CPAP therapy with a pressure range of 5-20cm H2O.     b.  An oral appliance (OA) that can be obtained from certain dentists with expertise in sleep medicine.  These are primarily of use in non-obese patients with mild and moderate disease.     c.  An ENT consultation which may be useful to look for specific causes of obstruction and possible treatment options.     d.  If patient is intolerant to PAP therapy, consider referral to ENT for evaluation for hypoglossal nerve stimulator.   3.  Close follow-up is necessary to ensure success with CPAP or oral appliance therapy for maximum benefit.  4.  A follow-up oximetry study on CPAP is recommended to assess the adequacy of therapy and determine the need for supplemental oxygen or the potential need for Bi-level therapy.  An arterial blood gas to determine the adequacy of baseline ventilation and oxygenation should also be considered.  5.  Healthy sleep recommendations include:  adequate nightly sleep (normal 7-9 hrs/night), avoidance of caffeine after noon and alcohol near bedtime, and maintaining a sleep environment that is cool, dark and quiet.  6.  Weight loss for overweight patients is recommended.  Even modest amounts of weight loss can significantly improve the severity of sleep apnea.  7.  Snoring recommendations include:  weight loss where appropriate,  side sleeping, and avoidance of alcohol before bed.  8.  Operation of motor vehicle should not be performed when sleepy.  9.  Consider outpatient event monitor to assess for silent atrial fibrillation if clinically indicated.  Signature: Fransico Him, MD; [facility]; [place], [place] Board of Sleep Medicine Electronically Signed: 07/24/2022

## 2022-07-25 ENCOUNTER — Ambulatory Visit: Payer: BC Managed Care – PPO | Attending: Cardiovascular Disease

## 2022-07-25 DIAGNOSIS — G4730 Sleep apnea, unspecified: Secondary | ICD-10-CM

## 2022-08-02 ENCOUNTER — Telehealth: Payer: Self-pay | Admitting: *Deleted

## 2022-08-02 NOTE — Telephone Encounter (Signed)
The patient has been notified of the result and verbalized understanding.  All questions (if any) were answered. Christopher Hart, [place] 08/02/2022 5:04 PM    Upon patient request DME selection is [facility]. Patient understands he will be contacted by [facility] to set up his cpap. Patient understands to call if [facility] does not contact him with new setup in a timely manner. Patient understands they will be called once confirmation has been received from [facility]/ that they have received their new machine to schedule 10 week follow up appointment.   [place] notified of new cpap order  Please add to airview Patient was grateful for the call and thanked me.

## 2022-08-02 NOTE — Telephone Encounter (Signed)
-----   Message from Lauralee Evener, [place] sent at 07/25/2022  8:14 AM EST -----  ----- Message ----- From: Sueanne Margarita, MD Sent: 07/24/2022   5:21 PM EST To: Cv Div Sleep Studies  Please let patient know that they have sleep apnea and recommend treating with CPAP.  Please order an auto CPAP from 4-15cm H2O with heated humidity and mask of choice.  Order overnight pulse ox on CPAP.  Followup with me in 6 weeks.

## 2022-08-04 DIAGNOSIS — R42 Dizziness and giddiness: Secondary | ICD-10-CM | POA: Diagnosis not present

## 2022-08-04 DIAGNOSIS — R2681 Unsteadiness on feet: Secondary | ICD-10-CM | POA: Diagnosis not present

## 2022-08-04 DIAGNOSIS — R531 Weakness: Secondary | ICD-10-CM | POA: Diagnosis not present

## 2022-08-09 ENCOUNTER — Telehealth: Payer: Self-pay

## 2022-08-09 ENCOUNTER — Ambulatory Visit: Payer: BC Managed Care – PPO | Attending: Cardiology

## 2022-08-09 DIAGNOSIS — I4891 Unspecified atrial fibrillation: Secondary | ICD-10-CM

## 2022-08-09 NOTE — Telephone Encounter (Signed)
-----   Message from Minna Merritts, MD sent at 08/06/2022  7:21 PM EST ----- Triage, can we arrange a Zio monitor if not ordered already for possible atrial fibrillation on sleep study Thx TG ----- Message ----- From: Sueanne Margarita, MD Sent: 07/24/2022   5:23 PM EST To: Minna Merritts, MD  Starting CPAP.  ? PAF on study - might want to get heart monitor

## 2022-08-09 NOTE — Telephone Encounter (Signed)
Pt made aware of MD's recommendations and verbalized understanding. Heart monitor ordered as instructed

## 2022-09-08 NOTE — Telephone Encounter (Signed)
Fax came from [place] saying the order for this patient had been voided due to no patient contact. Reached out to Christopher Hart and left a message with the number to [facility] to return their call. [facility] will soft void the order until patient is ready to get his unit.

## 2022-09-26 ENCOUNTER — Telehealth: Payer: Self-pay | Admitting: Cardiovascular Disease

## 2022-09-26 DIAGNOSIS — Z0279 Encounter for issue of other medical certificate: Secondary | ICD-10-CM

## 2022-09-26 NOTE — Telephone Encounter (Signed)
Patient signed forms from [place] [facility]. Paid [phone] fee with personal check. Faxed billing sheet to [facility] Billing

## 2022-09-26 NOTE — Telephone Encounter (Signed)
Placed forms in Dr. Donivan Scull nurse box.

## 2022-09-26 NOTE — Telephone Encounter (Signed)
Forms placed up front for patient to sign.

## 2022-09-26 NOTE — Telephone Encounter (Signed)
Forms received via fax from [place]. Called patient and informed of $29 processing fee and forms that needed to be signed. Patient verbalized understanding and will stop by at his convenience to sign.

## 2022-09-26 NOTE — Telephone Encounter (Signed)
Patient signed paperwork & paid $29 check, folder placed on [place] desk

## 2022-09-27 NOTE — Telephone Encounter (Signed)
Left voicemail message to call back in regards to forms.

## 2022-09-27 NOTE — Telephone Encounter (Signed)
Spoke with patient in regards to his disability documents. Inquired what these were for and any particular provider that deemed him disabled. He was not clear if it was from Neurology, neurosurgeon, or any other provider that he may be seeing. Advised that I do not see any cardiac indications or restrictions so he may want to check with them and his other providers before we proceed with [place] filling them out. He verbalized understanding and will call us back if he would like for [place] to fill those out. No further needs. Placing red folder with forms on my cart pending his call back.

## 2022-09-27 NOTE — Telephone Encounter (Signed)
Pt returning nurses call in regards to paperwork. Please advise.

## 2022-10-23 ENCOUNTER — Other Ambulatory Visit: Payer: Self-pay | Admitting: Cardiovascular Disease

## 2022-11-07 ENCOUNTER — Telehealth: Payer: Self-pay | Admitting: Cardiovascular Disease

## 2022-11-07 NOTE — Telephone Encounter (Signed)
Patient states sending records was previously discussed with Pam and he wanted to apologize and provide and update. Patient states he spoke with [facility] and they are aware that we are only able to send case note records. He states, per [employer], case notes are fine.

## 2022-11-08 NOTE — Telephone Encounter (Signed)
Spoke with patient regarding his forms. He states all they need is office notes. These were faxed to number on forms by [facility]. Reviewed that we still have his forms and if he needs refund to let us know. He verbalized understanding with no further questions at this time.

## 2022-11-08 NOTE — Telephone Encounter (Signed)
Left voicemail message to call back  

## 2023-06-13 DIAGNOSIS — F331 Major depressive disorder, recurrent, moderate: Secondary | ICD-10-CM | POA: Diagnosis not present

## 2023-06-13 DIAGNOSIS — F411 Generalized anxiety disorder: Secondary | ICD-10-CM | POA: Diagnosis not present

## 2023-06-20 DIAGNOSIS — R479 Unspecified speech disturbances: Secondary | ICD-10-CM | POA: Diagnosis not present

## 2023-06-20 DIAGNOSIS — R42 Dizziness and giddiness: Secondary | ICD-10-CM | POA: Diagnosis not present

## 2023-06-20 DIAGNOSIS — R4781 Slurred speech: Secondary | ICD-10-CM | POA: Diagnosis not present

## 2023-06-20 DIAGNOSIS — R262 Difficulty in walking, not elsewhere classified: Secondary | ICD-10-CM | POA: Diagnosis not present

## 2023-06-20 DIAGNOSIS — H9193 Unspecified hearing loss, bilateral: Secondary | ICD-10-CM | POA: Diagnosis not present

## 2023-06-20 DIAGNOSIS — M545 Low back pain, unspecified: Secondary | ICD-10-CM | POA: Diagnosis not present

## 2023-06-20 DIAGNOSIS — A523 Neurosyphilis, unspecified: Secondary | ICD-10-CM | POA: Diagnosis not present

## 2023-06-20 DIAGNOSIS — Z8659 Personal history of other mental and behavioral disorders: Secondary | ICD-10-CM | POA: Diagnosis not present

## 2023-06-20 DIAGNOSIS — M6281 Muscle weakness (generalized): Secondary | ICD-10-CM | POA: Diagnosis not present

## 2023-06-20 DIAGNOSIS — Z8669 Personal history of other diseases of the nervous system and sense organs: Secondary | ICD-10-CM | POA: Diagnosis not present

## 2023-06-20 DIAGNOSIS — D432 Neoplasm of uncertain behavior of brain, unspecified: Secondary | ICD-10-CM | POA: Diagnosis not present

## 2023-07-04 DIAGNOSIS — M6281 Muscle weakness (generalized): Secondary | ICD-10-CM | POA: Diagnosis not present

## 2023-07-04 DIAGNOSIS — M545 Low back pain, unspecified: Secondary | ICD-10-CM | POA: Diagnosis not present

## 2023-07-09 DIAGNOSIS — R002 Palpitations: Secondary | ICD-10-CM | POA: Diagnosis not present

## 2023-07-09 DIAGNOSIS — I25110 Atherosclerotic heart disease of native coronary artery with unstable angina pectoris: Secondary | ICD-10-CM | POA: Diagnosis not present

## 2023-07-09 DIAGNOSIS — I2510 Atherosclerotic heart disease of native coronary artery without angina pectoris: Secondary | ICD-10-CM | POA: Diagnosis not present

## 2023-07-09 DIAGNOSIS — E782 Mixed hyperlipidemia: Secondary | ICD-10-CM | POA: Diagnosis not present

## 2023-07-09 DIAGNOSIS — R55 Syncope and collapse: Secondary | ICD-10-CM | POA: Diagnosis not present

## 2023-07-09 DIAGNOSIS — I10 Essential (primary) hypertension: Secondary | ICD-10-CM | POA: Diagnosis not present

## 2023-07-09 DIAGNOSIS — R42 Dizziness and giddiness: Secondary | ICD-10-CM | POA: Diagnosis not present

## 2023-07-18 DIAGNOSIS — M545 Low back pain, unspecified: Secondary | ICD-10-CM | POA: Diagnosis not present

## 2023-07-18 DIAGNOSIS — M6281 Muscle weakness (generalized): Secondary | ICD-10-CM | POA: Diagnosis not present

## 2023-07-19 DIAGNOSIS — E559 Vitamin D deficiency, unspecified: Secondary | ICD-10-CM | POA: Diagnosis not present

## 2023-07-19 DIAGNOSIS — M5412 Radiculopathy, cervical region: Secondary | ICD-10-CM | POA: Diagnosis not present

## 2023-07-19 DIAGNOSIS — Z125 Encounter for screening for malignant neoplasm of prostate: Secondary | ICD-10-CM | POA: Diagnosis not present

## 2023-07-19 DIAGNOSIS — E119 Type 2 diabetes mellitus without complications: Secondary | ICD-10-CM | POA: Diagnosis not present

## 2023-07-19 DIAGNOSIS — E538 Deficiency of other specified B group vitamins: Secondary | ICD-10-CM | POA: Diagnosis not present

## 2023-07-23 DIAGNOSIS — R55 Syncope and collapse: Secondary | ICD-10-CM | POA: Diagnosis not present

## 2023-07-23 DIAGNOSIS — Z0000 Encounter for general adult medical examination without abnormal findings: Secondary | ICD-10-CM | POA: Diagnosis not present

## 2023-07-23 DIAGNOSIS — E538 Deficiency of other specified B group vitamins: Secondary | ICD-10-CM | POA: Diagnosis not present

## 2023-07-23 DIAGNOSIS — M5412 Radiculopathy, cervical region: Secondary | ICD-10-CM | POA: Diagnosis not present

## 2023-07-23 DIAGNOSIS — R42 Dizziness and giddiness: Secondary | ICD-10-CM | POA: Diagnosis not present

## 2023-07-23 DIAGNOSIS — R002 Palpitations: Secondary | ICD-10-CM | POA: Diagnosis not present

## 2023-07-23 DIAGNOSIS — F411 Generalized anxiety disorder: Secondary | ICD-10-CM | POA: Diagnosis not present

## 2023-07-23 DIAGNOSIS — A523 Neurosyphilis, unspecified: Secondary | ICD-10-CM | POA: Diagnosis not present

## 2023-07-23 DIAGNOSIS — Z23 Encounter for immunization: Secondary | ICD-10-CM | POA: Diagnosis not present

## 2023-07-23 DIAGNOSIS — E782 Mixed hyperlipidemia: Secondary | ICD-10-CM | POA: Diagnosis not present

## 2023-07-23 DIAGNOSIS — B20 Human immunodeficiency virus [HIV] disease: Secondary | ICD-10-CM | POA: Diagnosis not present

## 2023-07-23 DIAGNOSIS — I10 Essential (primary) hypertension: Secondary | ICD-10-CM | POA: Diagnosis not present

## 2023-07-25 DIAGNOSIS — M545 Low back pain, unspecified: Secondary | ICD-10-CM | POA: Diagnosis not present

## 2023-07-25 DIAGNOSIS — M6281 Muscle weakness (generalized): Secondary | ICD-10-CM | POA: Diagnosis not present

## 2023-07-30 DIAGNOSIS — M545 Low back pain, unspecified: Secondary | ICD-10-CM | POA: Diagnosis not present

## 2023-07-30 DIAGNOSIS — M6281 Muscle weakness (generalized): Secondary | ICD-10-CM | POA: Diagnosis not present

## 2023-08-02 DIAGNOSIS — R002 Palpitations: Secondary | ICD-10-CM | POA: Diagnosis not present

## 2023-08-09 DIAGNOSIS — M545 Low back pain, unspecified: Secondary | ICD-10-CM | POA: Diagnosis not present

## 2023-08-09 DIAGNOSIS — M6281 Muscle weakness (generalized): Secondary | ICD-10-CM | POA: Diagnosis not present

## 2023-08-10 DIAGNOSIS — I10 Essential (primary) hypertension: Secondary | ICD-10-CM | POA: Diagnosis not present

## 2023-08-10 DIAGNOSIS — I2510 Atherosclerotic heart disease of native coronary artery without angina pectoris: Secondary | ICD-10-CM | POA: Diagnosis not present

## 2023-08-10 DIAGNOSIS — E782 Mixed hyperlipidemia: Secondary | ICD-10-CM | POA: Diagnosis not present

## 2023-08-15 DIAGNOSIS — M545 Low back pain, unspecified: Secondary | ICD-10-CM | POA: Diagnosis not present

## 2023-08-15 DIAGNOSIS — M6281 Muscle weakness (generalized): Secondary | ICD-10-CM | POA: Diagnosis not present

## 2023-08-22 DIAGNOSIS — M545 Low back pain, unspecified: Secondary | ICD-10-CM | POA: Diagnosis not present

## 2023-08-22 DIAGNOSIS — M6281 Muscle weakness (generalized): Secondary | ICD-10-CM | POA: Diagnosis not present

## 2023-08-23 DIAGNOSIS — F331 Major depressive disorder, recurrent, moderate: Secondary | ICD-10-CM | POA: Diagnosis not present

## 2023-08-23 DIAGNOSIS — F411 Generalized anxiety disorder: Secondary | ICD-10-CM | POA: Diagnosis not present

## 2023-10-24 ENCOUNTER — Other Ambulatory Visit: Payer: Self-pay | Admitting: Cardiovascular Disease
